# Patient Record
Sex: Female | Born: 1945
Health system: Southern US, Community
[De-identification: ages and names within clinical notes are randomized; demographics above are authoritative.]

## PROBLEM LIST (undated history)

## (undated) DIAGNOSIS — E785 Hyperlipidemia, unspecified: Secondary | ICD-10-CM

## (undated) DIAGNOSIS — F419 Anxiety disorder, unspecified: Secondary | ICD-10-CM

## (undated) DIAGNOSIS — D689 Coagulation defect, unspecified: Secondary | ICD-10-CM

## (undated) DIAGNOSIS — K219 Gastro-esophageal reflux disease without esophagitis: Secondary | ICD-10-CM

## (undated) DIAGNOSIS — I10 Essential (primary) hypertension: Secondary | ICD-10-CM

## (undated) DIAGNOSIS — G4730 Sleep apnea, unspecified: Secondary | ICD-10-CM

## (undated) DIAGNOSIS — H269 Unspecified cataract: Secondary | ICD-10-CM

## (undated) HISTORY — PX: EYE SURGERY: SHX253

## (undated) HISTORY — DX: Anxiety disorder, unspecified: F41.9

## (undated) HISTORY — DX: Essential (primary) hypertension: I10

## (undated) HISTORY — DX: Gastro-esophageal reflux disease without esophagitis: K21.9

## (undated) HISTORY — DX: Coagulation defect, unspecified: D68.9

## (undated) HISTORY — PX: TOE SURGERY: SHX1073

## (undated) HISTORY — DX: Hyperlipidemia, unspecified: E78.5

## (undated) HISTORY — PX: UPPER GASTROINTESTINAL ENDOSCOPY: SHX188

## (undated) HISTORY — DX: Unspecified cataract: H26.9

## (undated) HISTORY — DX: Sleep apnea, unspecified: G47.30

## (undated) HISTORY — PX: REDUCTION MAMMAPLASTY: SUR839

---

## 1966-06-16 HISTORY — PX: APPENDECTOMY: SHX54

## 1966-06-16 HISTORY — PX: TUBAL LIGATION: SHX77

## 1966-06-16 HISTORY — PX: CHOLECYSTECTOMY: SHX55

## 1999-08-19 ENCOUNTER — Other Ambulatory Visit: Admission: RE | Admit: 1999-08-19 | Discharge: 1999-08-19 | Payer: Self-pay | Admitting: Obstetrics and Gynecology

## 1999-09-09 ENCOUNTER — Encounter: Payer: Self-pay | Admitting: Obstetrics and Gynecology

## 1999-09-09 ENCOUNTER — Encounter: Admission: RE | Admit: 1999-09-09 | Discharge: 1999-09-09 | Payer: Self-pay | Admitting: Obstetrics and Gynecology

## 2000-10-16 ENCOUNTER — Encounter: Admission: RE | Admit: 2000-10-16 | Discharge: 2000-10-16 | Payer: Self-pay | Admitting: Obstetrics and Gynecology

## 2000-10-16 ENCOUNTER — Encounter: Payer: Self-pay | Admitting: Obstetrics and Gynecology

## 2000-10-16 ENCOUNTER — Other Ambulatory Visit: Admission: RE | Admit: 2000-10-16 | Discharge: 2000-10-16 | Payer: Self-pay | Admitting: Obstetrics and Gynecology

## 2001-05-24 ENCOUNTER — Encounter: Admission: RE | Admit: 2001-05-24 | Discharge: 2001-05-24 | Payer: Self-pay | Admitting: *Deleted

## 2001-05-24 ENCOUNTER — Encounter: Payer: Self-pay | Admitting: *Deleted

## 2001-11-10 ENCOUNTER — Other Ambulatory Visit: Admission: RE | Admit: 2001-11-10 | Discharge: 2001-11-10 | Payer: Self-pay | Admitting: Obstetrics and Gynecology

## 2001-12-22 ENCOUNTER — Encounter: Admission: RE | Admit: 2001-12-22 | Discharge: 2001-12-22 | Payer: Self-pay | Admitting: Obstetrics and Gynecology

## 2001-12-22 ENCOUNTER — Encounter: Payer: Self-pay | Admitting: Obstetrics and Gynecology

## 2002-06-16 HISTORY — PX: COLONOSCOPY: SHX174

## 2002-07-20 ENCOUNTER — Ambulatory Visit (HOSPITAL_COMMUNITY): Admission: RE | Admit: 2002-07-20 | Discharge: 2002-07-20 | Payer: Self-pay | Admitting: *Deleted

## 2002-07-20 ENCOUNTER — Encounter (INDEPENDENT_AMBULATORY_CARE_PROVIDER_SITE_OTHER): Payer: Self-pay | Admitting: Specialist

## 2002-08-10 ENCOUNTER — Ambulatory Visit (HOSPITAL_BASED_OUTPATIENT_CLINIC_OR_DEPARTMENT_OTHER): Admission: RE | Admit: 2002-08-10 | Discharge: 2002-08-10 | Payer: Self-pay | Admitting: Family Medicine

## 2003-06-27 ENCOUNTER — Encounter: Admission: RE | Admit: 2003-06-27 | Discharge: 2003-06-27 | Payer: Self-pay | Admitting: Obstetrics and Gynecology

## 2003-06-27 ENCOUNTER — Other Ambulatory Visit: Admission: RE | Admit: 2003-06-27 | Discharge: 2003-06-27 | Payer: Self-pay | Admitting: Obstetrics and Gynecology

## 2003-10-26 ENCOUNTER — Ambulatory Visit (HOSPITAL_COMMUNITY): Admission: RE | Admit: 2003-10-26 | Discharge: 2003-10-27 | Payer: Self-pay | Admitting: Plastic Surgery

## 2003-10-26 ENCOUNTER — Encounter (INDEPENDENT_AMBULATORY_CARE_PROVIDER_SITE_OTHER): Payer: Self-pay | Admitting: *Deleted

## 2004-07-26 ENCOUNTER — Other Ambulatory Visit: Admission: RE | Admit: 2004-07-26 | Discharge: 2004-07-26 | Payer: Self-pay | Admitting: Obstetrics and Gynecology

## 2004-07-30 ENCOUNTER — Encounter: Admission: RE | Admit: 2004-07-30 | Discharge: 2004-07-30 | Payer: Self-pay | Admitting: Obstetrics and Gynecology

## 2004-08-06 ENCOUNTER — Ambulatory Visit: Payer: Self-pay

## 2005-08-13 ENCOUNTER — Ambulatory Visit (HOSPITAL_COMMUNITY): Admission: RE | Admit: 2005-08-13 | Discharge: 2005-08-13 | Payer: Self-pay | Admitting: *Deleted

## 2005-10-14 ENCOUNTER — Encounter: Admission: RE | Admit: 2005-10-14 | Discharge: 2005-10-14 | Payer: Self-pay | Admitting: Obstetrics and Gynecology

## 2005-10-14 ENCOUNTER — Other Ambulatory Visit: Admission: RE | Admit: 2005-10-14 | Discharge: 2005-10-14 | Payer: Self-pay | Admitting: Obstetrics & Gynecology

## 2008-08-02 ENCOUNTER — Ambulatory Visit: Payer: Self-pay

## 2009-02-13 ENCOUNTER — Encounter: Admission: RE | Admit: 2009-02-13 | Discharge: 2009-02-13 | Payer: Self-pay | Admitting: Internal Medicine

## 2010-11-01 NOTE — Op Note (Signed)
NAMEJONIYAH, Alicia Parker               ACCOUNT NO.:  [id number]   MEDICAL RECORD NO.:  [id number]          PATIENT TYPE:  AMB   LOCATION:  ENDO                         FACILITY:  [facility]   PHYSICIAN:  Georgiana Spinner, M.D.    DATE OF BIRTH:  [birth year]   DATE OF PROCEDURE:  08/13/2005  DATE OF DISCHARGE:                                 OPERATIVE REPORT   PROCEDURE:  Colonoscopy.   ENDOSCOPIST:  Georgiana Spinner, M.D.   INDICATIONS:  Colon polyps.   ANESTHESIA:  None further given.   PROCEDURE:  With the patient mildly sedated in the left lateral decubitus  position, the Olympus videoscopic colonoscope was inserted into the rectum  and passed under direct vision to the cecum, identified by ileocecal valve  and crow's foot of the cecum, both of which were photographed; however,  because of technical difficulties, the photographs were not obtained.  From  this point, the colonoscope was slowly withdrawn, taking circumferential  views of the colonic mucosa, stopping in the rectum, which appeared normal  on direct, and retroflexed view showed hemorrhoids.  The endoscope was  straightened and withdrawn.  The patient's vital signs and pulse oximeter  remained stable.  The patient tolerated the procedure well without apparent  complications.   FINDINGS:  Internal hemorrhoids, otherwise an unremarkable colonoscopic  examination to cecum.   PLAN:  Repeat examination in 5 years           ______________________________  Georgiana Spinner, M.D.     GMO/MEDQ  D:  08/13/2005  T:  08/14/2005  Job:  [id number]

## 2010-11-01 NOTE — Op Note (Signed)
NAMELATRAVIA, Alicia Parker                         ACCOUNT NO.:  [id number]   MEDICAL RECORD NO.:  [id number]                   PATIENT TYPE:  OIB   LOCATION:  5715                                 FACILITY:  [facility]   PHYSICIAN:  Brantley Persons, M.D.             DATE OF BIRTH:  07-15-1945   DATE OF PROCEDURE:  10/26/2003  DATE OF DISCHARGE:                                 OPERATIVE REPORT   PREOPERATIVE DIAGNOSIS:  Bilateral macromastia.   POSTOPERATIVE DIAGNOSIS:  Bilateral macromastia.   PROCEDURE:  Bilateral reduction mammoplasties.   SURGEON:  Brantley Persons, M.D.   ASSISTANT:  Raynald Kemp, M.D.   ANESTHESIA:  General endotracheal.   ANESTHESIOLOGIST:  Zenon Mayo, M.D.   ESTIMATED BLOOD LOSS:  Was 200 mL.   FLUIDS REPLACED:  Was 2000 mL of crystalloid.   URINE OUTPUT:  Was 650 mL.   COMPLICATIONS:  None.   INDICATIONS FOR PROCEDURE:  The patient is a 65 year old African-American  female who has bilateral macromastia that is clinically symptomatic.  She  presents to undergo bilateral reduction mammoplasty surgery.   JUSTIFICATION FOR OVERNIGHT STAY:  Progressive pain control along with  ambulation and monitoring of the nipple and breast flaps, and a history of  sleep apnea.   DESCRIPTION OF PROCEDURE:  The patient was marked in the preoperative  holding area in the pattern of Wise for the future bilateral reduction  mammoplasties.  She was then taken back to the operating room and placed on  the table in the supine position.  After adequate general endotracheal  anesthesia was obtained, the patient's chest was prepped with Betadine and  alcohol and draped in a sterile fashion.  First the right breast reduction was performed, as this was the slightly  smaller breast of the two.  The nipple was marked with the 45 mm nipple  marker.  This was then incised and the skin around the nipple de-  epithelialized down to the inframammary crease in the inferior  pedicle  pattern.  Next, the medial and superior lateral skin flaps elevated off of  the pedicle down to the chest wall.  The excess fat and glandular tissue  were then removed from the inferior pedicle.  The nipple was examined and  found to be pink and viable.  The wound was irrigated with saline  irrigation.  Meticulous hemostasis was obtained with the Bovie  electrocautery.  The inferior pedicle tissue was then internally shaped with  #3-0 Prolene sutures, to make it a perkier breast.  A #10 JP flap was fully-  fluted drain was then placed into the wound.  The skin flaps were brought  together at the inverted-T junction with a #2-0 Prolene suture.  The rest of  the skin flaps were then stapled for temporary closure.  In evaluating the  breast, it appeared to be a nice size and shape for the patient.  Attention was then  turned to the left breast.  The nipple was marked with  the 45 mm nipple marker.  This was then incised and the skin de-  epithelialized down to the inframammary crease and an inferior pedicle  pattern.  Next, the medial common superior and lateral skin flaps were  elevated off of the chest wall.  The excess fat and glandular tissue were  removed from the inferior pedicle.  The wound was then irrigated with saline  irrigation.  Meticulous hemostasis was obtained with the Bovie  electrocautery.  The breast inferior pedicle was then internally shaped  using #3-0 Prolene sutures.  A #10 JP flat fully-fluted drain was then  placed into the wound and brought out through the future incision site.  The  skin flaps were brought together at the inverted-T junction with a #2-0  Prolene suture.  The rest of the skin incisions were then stapled for  temporary closure.  The breasts were then compared and found to have good  shape and symmetry.  All of the staples were then removed, and the incisions  were closed using a #3-0 Monocryl suture in the dermal layer, followed by a  #4-0  Monocryl running intercuticular stitch on the skin.  The patient was  then placed in the upright position, to mark the future nipple location for  both breasts.  The 42 mm nipple marker was used for this.  With the patient  in the upright position, it did appear as if her breasts were fairly  symmetric.  After the location of the proposed new nipple/areolar complexes  were marked, the patient was then placed back into the supine and recumbent  position.  First, the future nipple/areolar complex was incised on the right breast  mound.  The tissue was excised full thickness through the skin into the  subcutaneous tissues.  The nipple/areolar complex was then located on the  inferior pedicle, and brought out into the aperture and sewn in place using  the #4-0 Monocryl in the dermal layer, followed by a #5-0 Monocryl running  intercuticular stitch on the skin.  In a likewise fashion, the future  nipple/areolar complex was incised on the left breast mound.  This tissue  was excised full thickness.  The nipple/areolar complex on the left breast  inferior pedicle was then also examined, and then brought out through this  new nipple/areolar site and sewn in place using #4-0 Monocryl in the dermal  layer, followed by a #5-0 Monocryl on the intercuticular stitch on the skin.  The JP drain was sewn in place using #3-0 nylon suture.  Benzoin and Steri-  Strips were placed on the incisions, and the nipples additionally had  Bacitracin ointment and Adaptic.  Then 4 x 4s were placed over the breast  incisions and the JP drain.  She was then also placed into a postoperative  light support bra.  There were no complications.  The patient tolerated the procedure well.  The final needle and sponge  counts were reported to be correct at the end of the case.  She was then  awakened from the anesthesia, and taken to the recovery room in stable  condition.  DISPOSITION:  The patient will remain overnight in the  hospital, as a 23-  hour observation stay patient.  She will do this to have monitoring of her  nipples and breast flaps, as well as good pain control.  The plans are to  discharge the patient in the morning.  Brantley Persons, M.D.    MC/MEDQ  D:  10/26/2003  T:  10/28/2003  Job:  [id number]   cc:   Raynald Kemp, M.D.  [address]  [place]  [place] [address]  Fax: [phone]

## 2010-11-01 NOTE — Op Note (Signed)
   Alicia Parker, Alicia Parker                         ACCOUNT NO.:  [id number]   MEDICAL RECORD NO.:  [id number]                   PATIENT TYPE:  AMB   LOCATION:  ENDO                                 FACILITY:  [facility]   PHYSICIAN:  Georgiana Spinner, M.D.                 DATE OF BIRTH:  11/08/1945   DATE OF PROCEDURE:  DATE OF DISCHARGE:                                 OPERATIVE REPORT   PROCEDURE:  Upper endoscopy.   ANESTHESIA:  Demerol 50, Versed 4 mg.   INDICATIONS FOR PROCEDURE:  Reflux and abdominal pain.   DESCRIPTION OF PROCEDURE:  With the patient mildly sedated in the left  lateral decubitus position, the Olympus videoscopic endoscope was inserted  in the mouth and passed under direct vision through the esophagus which  appeared normal into the stomach. The fundus and body appeared normal. The  antrum appeared mildly edematous in the prepyloric area. Photographs and  biopsies taken. We entered into the duodenal bulb and it was quite  erythematous consistent with a duodenitis. There may have been some erosions  in this area as well. These were photographed. The second portion of the  duodenum appeared normal. From this point, the endoscope was slowly  withdrawn taking circumferential views of the duodenal mucosa until the  endoscope was then pulled back into the stomach, placed in retroflexion to  view the stomach from below. The endoscope was then straightened and  withdrawn taking circumferential views of the remaining gastric and  esophageal mucosa. The patient's vital signs and pulse oximeter remained  stable. The patient tolerated the procedure well without apparent  complications.   FINDINGS:  Mild edema of antrum biopsied and duodenitis with erosions. Await  biopsy report. If the patient is H. pylori positive will treat appropriately  but must rule out NSAID induced duodenitis. Will discuss this further with  the patient. Probably start patient on PPI therapy and have the  patient  follow-up with me as an outpatient. Proceed to colonoscopy.                                                 Georgiana Spinner, M.D.    GMO/MEDQ  D:  07/20/2002  T:  07/20/2002  Job:  [id number]   cc:   Otho Darner, M.D.  [address]  [place]  [place] [address]  Fax: [phone]

## 2010-11-01 NOTE — Op Note (Signed)
   NAMEANGELAMARIE, Alicia Parker                         ACCOUNT NO.:  [id number]   MEDICAL RECORD NO.:  [id number]                   PATIENT TYPE:  AMB   LOCATION:  ENDO                                 FACILITY:  [facility]   PHYSICIAN:  Georgiana Spinner, M.D.                 DATE OF BIRTH:  06-18-1945   DATE OF PROCEDURE:  07/20/2002  DATE OF DISCHARGE:                                 OPERATIVE REPORT   PROCEDURE:  Colonoscopy.   INDICATIONS:  Colon polyps, colon cancer screening.   ANESTHESIA:  Demerol 25 mg, Versed 2 mg.   DESCRIPTION OF PROCEDURE:  With the patient mildly sedated in the left  lateral decubitus position, the Olympus videoscopic colonoscope was inserted  in the rectum and passed under direct vision to the cecum, identified by the  crow's foot of the cecum and the ileocecal valve, both of which were  photographed.  From this point the colonoscope was slowly withdrawn, taking  circumferential views of the entire colonic mucosa, stopping at 40 cm from  the anal verge, at which point a small polyp was seen, photographed, and  removed using snare cautery technique, setting of 20-20 blended current.  The endoscope was then withdrawn all the way to the rectum, which appeared  normal on direct and retroflexed view.  The endoscope was straightened and  withdrawn.  The patient's vital signs and pulse oximetry remained stable.  The patient tolerated the procedure well without apparent complications.   FINDINGS:  Polyp at 40 cm from the anal verge.   PLAN:  Await biopsy report.  The patient will call me for results and follow  up with me as an outpatient.                                               Georgiana Spinner, M.D.    GMO/MEDQ  D:  07/20/2002  T:  07/20/2002  Job:  [id number]   cc:   Otho Darner, M.D.  [address]  [place]  [place] [address]  Fax: [phone]

## 2010-11-01 NOTE — Op Note (Signed)
   Alicia Parker, Alicia Parker                         ACCOUNT NO.:  [id number]   MEDICAL RECORD NO.:  [id number]                   PATIENT TYPE:  AMB   LOCATION:  ENDO                                 FACILITY:  [facility]   PHYSICIAN:  Georgiana Spinner, M.D.                 DATE OF BIRTH:  11/12/1945   DATE OF PROCEDURE:  07/20/2002  DATE OF DISCHARGE:                                 OPERATIVE REPORT   ADDENDUM:  The patient actually did desaturate some transiently with a lot  of loud snoring and appeared to have some degree of sleep apnea.  Discussed  this with her husband after the procedure, and he states that she has been  doing that.  He is not sure that she has had any evaluation of that problem,  but I will give a referral to Soyla Murphy. Renne Crigler, M.D., who does sleep studies  in my practice for further evaluation of that problem only, and further  follow-up will be determined by Dr. Renne Crigler and by Otho Darner, M.D.  Attach this to the colonoscopy note.                                               Georgiana Spinner, M.D.    GMO/MEDQ  D:  07/20/2002  T:  07/20/2002  Job:  [id number]   cc:   Otho Darner, M.D.  [address]  [place]  [place] [address]  Fax: [phone]   Soyla Murphy. Renne Crigler, M.D.  [address]  [place]  [place] [address]  Fax: [phone]

## 2010-11-01 NOTE — Op Note (Signed)
Alicia Parker, Alicia Parker               ACCOUNT NO.:  [id number]   MEDICAL RECORD NO.:  [id number]          PATIENT TYPE:  AMB   LOCATION:  ENDO                         FACILITY:  [facility]   PHYSICIAN:  Georgiana Spinner, M.D.    DATE OF BIRTH:  September 17, 1945   DATE OF PROCEDURE:  08/13/2005  DATE OF DISCHARGE:                                 OPERATIVE REPORT   PROCEDURE:  Upper endoscopy.   INDICATIONS:  Gastroesophageal reflux disease.   ANESTHESIA:  Demerol 50, Versed 5 mg.   PROCEDURE:  With the patient mildly sedated in the left lateral decubitus  position,  the Olympus videoscopic endoscope was inserted in the mouth and  passed under direct vision through the esophagus which appeared normal.  There was no evidence of Barrett's esophagus. We entered into the stomach.  The fundus, body, antrum, duodenal bulb, second portion of duodenum were  visualized. From this point, the endoscope was slowly withdrawn taking  circumferential views of the duodenal mucosa until the endoscope had been  pulled back into the stomach placed in retroflexion to view the stomach from  below. The endoscope was straightened and withdrawn taking circumferential  views of the remaining gastric and esophageal mucosa. The patient's vital  signs and pulse oximeter remained stable. The patient tolerated the  procedure well without apparent complications.   FINDINGS:  Unremarkable examination.   PLAN:  Proceed to colonoscopy.           ______________________________  Georgiana Spinner, M.D.     GMO/MEDQ  D:  08/13/2005  T:  08/14/2005  Job:  [id number]

## 2011-09-04 ENCOUNTER — Ambulatory Visit (INDEPENDENT_AMBULATORY_CARE_PROVIDER_SITE_OTHER): Payer: BC Managed Care – PPO | Admitting: Internal Medicine

## 2011-09-04 VITALS — BP 119/73 | HR 61 | Temp 98.3°F | Resp 16 | Ht 60.5 in | Wt 168.6 lb

## 2011-09-04 DIAGNOSIS — R42 Dizziness and giddiness: Secondary | ICD-10-CM

## 2011-09-04 DIAGNOSIS — H8110 Benign paroxysmal vertigo, unspecified ear: Secondary | ICD-10-CM

## 2011-09-04 DIAGNOSIS — H8109 Meniere's disease, unspecified ear: Secondary | ICD-10-CM

## 2011-09-04 LAB — POCT CBC
Granulocyte percent: 53.3 %G (ref 37–80)
Hemoglobin: 12.5 g/dL (ref 12.2–16.2)
MCH, POC: 27.5 pg (ref 27–31.2)
MCHC: 31.9 g/dL (ref 31.8–35.4)
MCV: 86.3 fL (ref 80–97)
POC Granulocyte: 3.1 (ref 2–6.9)
POC MID %: 6.6 %M (ref 0–12)
RDW, POC: 14.7 %

## 2011-09-04 NOTE — Progress Notes (Signed)
  Subjective:    Patient ID: Alicia Parker, female    DOB: Oct 20, 1945, 66 y.o.   MRN: [id number]  HPI Had on 5 min spell of vertigo while walking yesterday. No HA, nause, speech change, or focal NMS loss. Full recovery and normal today. Chart reviewed an d had normal cpe and tests jan. 2012, on no meds    Review of Systems See normal ROS 2012 and unchanged    Objective:   Physical Exam  Constitutional: She is oriented to person, place, and time. She appears well-nourished. No distress.  HENT:  Right Ear: Hearing, tympanic membrane and external ear normal.  Left Ear: Hearing, tympanic membrane and external ear normal.  Nose: Nose normal.  Mouth/Throat: Oropharynx is clear and moist.  Eyes: EOM are normal. Pupils are equal, round, and reactive to light.  Neck: Normal range of motion. Neck supple.  Cardiovascular: Normal rate, regular rhythm and normal heart sounds.   Pulmonary/Chest: Effort normal and breath sounds normal.  Abdominal: Soft. Bowel sounds are normal.  Musculoskeletal: Normal range of motion.  Neurological: She is alert and oriented to person, place, and time. She has normal strength and normal reflexes. No cranial nerve deficit or sensory deficit. She displays a negative Romberg sign. She displays no Babinski's sign on the right side. She displays no Babinski's sign on the left side.       Good balance No drift  Skin: Skin is warm and dry.  Psychiatric: She has a normal mood and affect. Her speech is normal and behavior is normal. Judgment and thought content normal.     Results for orders placed in visit on 09/04/11  POCT CBC      Component Value Range   WBC 5.9  4.6 - 10.2 (K/uL)   Lymph, poc 2.4  0.6 - 3.4    POC LYMPH PERCENT 40.1  10 - 50 (%L)   MID (cbc) 0.4  0 - 0.9    POC MID % 6.6  0 - 12 (%M)   POC Granulocyte 3.1  2 - 6.9    Granulocyte percent 53.3  37 - 80 (%G)   RBC 4.54  4.04 - 5.48 (M/uL)   Hemoglobin 12.5  12.2 - 16.2 (g/dL)   HCT, POC  46.9  62.9 - 47.9 (%)   MCV 86.3  80 - 97 (fL)   MCH, POC 27.5  27 - 31.2 (pg)   MCHC 31.9  31.8 - 35.4 (g/dL)   RDW, POC 52.8     Platelet Count, POC 286  142 - 424 (K/uL)   MPV 7.4  0 - 99.8 (fL)    Normal  CBC    Assessment & Plan:  BPV  Semont maneuver given Meclizine prn Refer to ENT if worsens

## 2011-09-04 NOTE — Patient Instructions (Signed)

## 2011-10-08 ENCOUNTER — Other Ambulatory Visit: Payer: Self-pay | Admitting: Internal Medicine

## 2011-10-08 ENCOUNTER — Other Ambulatory Visit: Payer: Self-pay | Admitting: Oncology

## 2011-10-08 DIAGNOSIS — Z1231 Encounter for screening mammogram for malignant neoplasm of breast: Secondary | ICD-10-CM

## 2011-10-15 ENCOUNTER — Ambulatory Visit (INDEPENDENT_AMBULATORY_CARE_PROVIDER_SITE_OTHER): Payer: BC Managed Care – PPO | Admitting: Family Medicine

## 2011-10-15 ENCOUNTER — Encounter: Payer: Self-pay | Admitting: Family Medicine

## 2011-10-15 ENCOUNTER — Ambulatory Visit
Admission: RE | Admit: 2011-10-15 | Discharge: 2011-10-15 | Disposition: A | Discharge status: Home / Self Care | Payer: BC Managed Care – PPO | Source: Ambulatory Visit | Attending: Internal Medicine | Admitting: Internal Medicine

## 2011-10-15 ENCOUNTER — Other Ambulatory Visit: Payer: Self-pay | Admitting: Family Medicine

## 2011-10-15 VITALS — BP 135/78 | HR 69 | Temp 98.0°F | Resp 16 | Ht 60.0 in | Wt 168.8 lb

## 2011-10-15 DIAGNOSIS — E669 Obesity, unspecified: Secondary | ICD-10-CM

## 2011-10-15 DIAGNOSIS — M47816 Spondylosis without myelopathy or radiculopathy, lumbar region: Secondary | ICD-10-CM

## 2011-10-15 DIAGNOSIS — IMO0002 Reserved for concepts with insufficient information to code with codable children: Secondary | ICD-10-CM

## 2011-10-15 DIAGNOSIS — Z01419 Encounter for gynecological examination (general) (routine) without abnormal findings: Secondary | ICD-10-CM

## 2011-10-15 DIAGNOSIS — E782 Mixed hyperlipidemia: Secondary | ICD-10-CM

## 2011-10-15 DIAGNOSIS — E7800 Pure hypercholesterolemia, unspecified: Secondary | ICD-10-CM

## 2011-10-15 DIAGNOSIS — Z1231 Encounter for screening mammogram for malignant neoplasm of breast: Secondary | ICD-10-CM

## 2011-10-15 DIAGNOSIS — Z0000 Encounter for general adult medical examination without abnormal findings: Secondary | ICD-10-CM

## 2011-10-15 LAB — BASIC METABOLIC PANEL
Calcium: 9.2 mg/dL (ref 8.4–10.5)
Chloride: 104 mEq/L (ref 96–112)
Creat: 0.7 mg/dL (ref 0.50–1.10)
Glucose, Bld: 87 mg/dL (ref 70–99)

## 2011-10-15 LAB — POCT URINALYSIS DIPSTICK
Bilirubin, UA: NEGATIVE
Nitrite, UA: NEGATIVE
Protein, UA: NEGATIVE

## 2011-10-15 LAB — LIPID PANEL
Cholesterol: 184 mg/dL (ref 0–200)
HDL: 59 mg/dL (ref >39)
LDL Cholesterol: 114 mg/dL — ABNORMAL HIGH (ref 0–99)
Total CHOL/HDL Ratio: 3.1 Ratio
Triglycerides: 57 mg/dL (ref <150)
VLDL: 11 mg/dL (ref 0–40)

## 2011-10-15 NOTE — Patient Instructions (Signed)
HPV Test The HPV (Human papillomavirus) test isused to screen for high-risk types with HPV infection. HPV is a group of about 100 related viruses, of which 40 types are genital viruses. Most HPV viruses cause infections that usually resolve without treatment within 2 years. Some HPV infections can cause skin and genital warts (condylomata). HPV types 16, 18, 31 and 45 are considered high-risk types of HPV. High-risk types of HPV do not usually cause visible warts, but if untreated, may lead to cancers of the outlet of the womb (cervix) or anus. An HPV test identifies the DNA (genetic) strands of the HPV infection. Because the test identifies the DNA strands, the test is also referred to as the HPV DNA test. Although HPV is found in both males and females, the HPV test is only used to screen for cervical cancer in females. This test is recommended for females:  With an abnormal Pap test.   After treatment of an abnormal Pap test.   Aged 65 and older.   After treatment of a high-risk HPV infection.  The HPV test may be done at the same time as a Pap test in females over the age of 63. Both the HPV and Pap test require a sample of cells from the cervix. PREPARATION FOR TEST  You may be asked to avoid douching, tampons or birth canal (vaginal) medicines for 48 hours before the HPV test. You will be asked to urinate before the test. For the HPV test, you will need to lie on an exam table with your feet in stirrups. A spatula will be inserted into the vagina. The spatula will be used to swab the cervix for a cell and mucus sample. The sample will be further evaluated in a lab under a microscope. NORMAL FINDINGS  Normal: High-risk HPV is not found.  Ranges for normal findings may vary among different laboratories and hospitals. You should always check with your doctor after having lab work or other tests done to discuss the meaning of your test results and whether your values are considered within normal  limits. MEANING OF TEST An abnormal HPV test means that high-risk HPV is found. Your caregiver may recommend further testing. Your caregiver will go over the test results with you and discuss the importance and meaning of your results, as well as treatment options and the need for additional tests, if necessary. OBTAINING THE RESULTS  It is your responsibility to obtain your test results. Ask the lab or department performing the test when and how you will get your results. Document Released: 06/27/2004 Document Revised: 05/22/2011 Document Reviewed: 03/12/2005 [facility] Patient Information 2012 [place], [place].     Keeping You Healthy  Get These Tests  Blood Pressure- Have your blood pressure checked by your healthcare provider at least once a year.  Normal blood pressure is 120/80.  Weight- Have your body mass index (BMI) calculated to screen for obesity.  BMI is a measure of body fat based on height and weight.  You can calculate your own BMI at https://www.west-esparza.com/  Cholesterol- Have your cholesterol checked every year.  Diabetes- Have your blood sugar checked every year if you have high blood pressure, high cholesterol, a family history of diabetes or if you are overweight.  Pap Smear- Have a pap smear every 1 to 3 years if you have been sexually active.  If you are older than 65 and recent pap smears have been normal you may not need additional pap smears.  In addition, if  you have had a hysterectomy  For benign disease additional pap smears are not necessary.  Mammogram-Yearly mammograms are essential for early detection of breast cancer  Screening for Colon Cancer- Colonoscopy starting at age 31. Screening may begin sooner depending on your family history and other health conditions.  Follow up colonoscopy as directed by your Gastroenterologist.  Screening for Osteoporosis- Screening begins at age 20 with bone density scanning, sooner if you are at higher risk for developing  Osteoporosis.  Get these medicines  Calcium with Vitamin D- Your body requires 1200-1500 mg of Calcium a day and [phone] IU of Vitamin D a day.  You can only absorb 500 mg of Calcium at a time therefore Calcium must be taken in 2 or 3 separate doses throughout the day.  Hormones- Hormone therapy has been associated with increased risk for certain cancers and heart disease.  Talk to your healthcare provider about if you need relief from menopausal symptoms.  Aspirin- Ask your healthcare provider about taking Aspirin to prevent Heart Disease and Stroke.  Get these Immuniztions  Flu shot- Every fall  Pneumonia shot- Once after the age of 71; if you are younger ask your healthcare provider if you need a pneumonia shot.  Tetanus- Every ten years.  Zostavax- Once after the age of 11 to prevent shingles.  Take these steps  Don't smoke- Your healthcare provider can help you quit. For tips on how to quit, ask your healthcare provider or go to www.smokefree.gov or call 1-800 QUIT-NOW.  Be physically active- Exercise 5 days a week for a minimum of 30 minutes.  If you are not already physically active, start slow and gradually work up to 30 minutes of moderate physical activity.  Try walking, dancing, bike riding, swimming, etc.  Eat a healthy diet- Eat a variety of healthy foods such as fruits, vegetables, whole grains, low fat milk, low fat cheeses, yogurt, lean meats, chicken, fish, eggs, dried beans, tofu, etc.  For more information go to www.thenutritionsource.org  Dental visit- Brush and floss teeth twice daily; visit your dentist twice a year.  Eye exam- Visit your Optometrist or Ophthalmologist yearly.  Drink alcohol in moderation- Limit alcohol intake to one drink or less a day.  Never drink and drive.  Depression- Your emotional health is as important as your physical health.  If you're feeling down or losing interest in things you normally enjoy, please talk to your healthcare  provider.  Seat Belts- can save your life; always wear one  Smoke/Carbon Monoxide detectors- These detectors need to be installed on the appropriate level of your home.  Replace batteries at least once a year.  Violence- If anyone is threatening or hurting you, please tell your healthcare provider.  Living Will/ Health care power of attorney- Discuss with your healthcare provider and family.

## 2011-10-18 ENCOUNTER — Encounter: Payer: Self-pay | Admitting: *Deleted

## 2011-10-18 ENCOUNTER — Encounter: Payer: Self-pay | Admitting: Family Medicine

## 2011-10-18 DIAGNOSIS — Z8719 Personal history of other diseases of the digestive system: Secondary | ICD-10-CM | POA: Insufficient documentation

## 2011-10-18 DIAGNOSIS — E7800 Pure hypercholesterolemia, unspecified: Secondary | ICD-10-CM | POA: Insufficient documentation

## 2011-10-18 DIAGNOSIS — D049 Carcinoma in situ of skin, unspecified: Secondary | ICD-10-CM | POA: Insufficient documentation

## 2011-10-18 DIAGNOSIS — Z86010 Personal history of colonic polyps: Secondary | ICD-10-CM | POA: Insufficient documentation

## 2011-10-18 DIAGNOSIS — IMO0002 Reserved for concepts with insufficient information to code with codable children: Secondary | ICD-10-CM | POA: Insufficient documentation

## 2011-10-18 DIAGNOSIS — E669 Obesity, unspecified: Secondary | ICD-10-CM | POA: Insufficient documentation

## 2011-10-18 DIAGNOSIS — M47816 Spondylosis without myelopathy or radiculopathy, lumbar region: Secondary | ICD-10-CM | POA: Insufficient documentation

## 2011-10-18 NOTE — Progress Notes (Signed)
Quick Note:  Please call pt and advise that the following labs are abnormal...  Your LDL ("bad") cholesterol is a little above normal; your can improve this by eating healthier- no fried foods, less high fat foods, less processed foods, and more fruits and vegetables (eat the rainbow!) Eat less red meat and more chicken or pork and fish at least once a week. Eat low- fat dairy products .Regular exercise is important also.  All other labs are normal.   Copy to pt. ______

## 2011-10-18 NOTE — Progress Notes (Signed)
Subjective:    Patient ID: Alicia Parker, female    DOB: 1945/09/01, 66 y.o.   MRN: [id number]  HPI   This 66 y.o. AA female is here for CPE/PAP. PAP: 06/27/10- Negative/ +HPV. She has a history of  Sleep Apnea- sleep study done in 07/2002 (not using CPAP).  She is married x 45 years with 3 adult children and 8 grandchildren. She quit smoking 10/15/11. She drinks  beer on occasion but does not exercise. She works at [employer] Store.   Mammogram: 2011 (normal).  Colonoscopy: >5 years ago (normal).  Eye exam: 02/2011      Review of Systems  Constitutional: Negative.   HENT: Negative.   Eyes: Negative.   Respiratory: Negative for apnea, cough, chest tightness and shortness of breath.   Cardiovascular: Negative for chest pain, palpitations and leg swelling.  Gastrointestinal: Negative.   Genitourinary: Negative for dysuria, urgency, frequency, hematuria, vaginal bleeding, difficulty urinating and pelvic pain.  Musculoskeletal: Positive for back pain, arthralgias and gait problem.  Skin:       Moles and change in hair  Neurological: Negative.   Hematological: Negative.   Psychiatric/Behavioral: Negative.        Objective:   Physical Exam  Nursing note and vitals reviewed. Constitutional: She is oriented to person, place, and time. She appears well-developed and well-nourished. No distress.  HENT:  Head: Normocephalic and atraumatic.  Right Ear: External ear normal.  Left Ear: External ear normal.  Nose: Nose normal.  Mouth/Throat: Oropharynx is clear and moist.  Eyes: Conjunctivae and EOM are normal. Pupils are equal, round, and reactive to light. Right eye exhibits no discharge. Left eye exhibits no discharge.       Muddy sclerae  Neck: Normal range of motion. Neck supple. No JVD present. No thyromegaly present.  Cardiovascular: Normal rate, regular rhythm, normal heart sounds and intact distal pulses.  Exam reveals no gallop and no friction rub.   No murmur  heard. Pulmonary/Chest: Effort normal and breath sounds normal. No respiratory distress. She has no wheezes. She has no rales. Right breast exhibits no inverted nipple, no mass, no nipple discharge, no skin change and no tenderness. Left breast exhibits no inverted nipple, no mass, no nipple discharge, no skin change and no tenderness. Breasts are symmetrical.  Abdominal: Soft. Bowel sounds are normal. She exhibits no distension and no mass. There is no tenderness. There is no guarding.  Genitourinary: Rectum normal, vagina normal and uterus normal. Rectal exam shows no mass, no tenderness and anal tone normal. Guaiac negative stool. There is no rash, tenderness or lesion on the right labia. There is no rash, tenderness or lesion on the left labia. Cervix exhibits no motion tenderness, no discharge and no friability. Right adnexum displays no mass and no tenderness. Left adnexum displays no mass and no tenderness. No erythema or tenderness around the vagina. No vaginal discharge found.  Musculoskeletal: Normal range of motion. She exhibits no edema.       Minimal stiffness in major joints; Lumbar spine tender to palpation  Neurological: She is alert and oriented to person, place, and time. She has normal reflexes. No cranial nerve deficit. She exhibits normal muscle tone. Coordination normal.  Skin: Skin is warm and dry. No erythema.       Multiple nevi: 1-2 mm on trunk  Psychiatric: She has a normal mood and affect. Her behavior is normal. Judgment and thought content normal.    ECG (2010): NSR, normal ECG  Assessment & Plan:   1. Routine general medical examination at a health care facility  Basic metabolic panel, TSH RX: Zostavax vaccine  2. Encounter for cervical Pap smear with pelvic exam  Pap IG (Image Guided) (HPV + on 2012 PAP)  3. Elevated LDL cholesterol level  Lipid panel  4. Lumbar spondylosis   LS spine film 08/2009 showed stable loss of lordosis with spondylosis and disc space  narrowing at L4-L5. Encouraged weight loss

## 2011-10-21 LAB — PAP IG (IMAGE GUIDED)

## 2011-10-29 ENCOUNTER — Encounter: Payer: Self-pay | Admitting: Emergency Medicine

## 2011-10-30 LAB — HUMAN PAPILLOMAVIRUS, HIGH RISK: HPV DNA High Risk: DETECTED — AB

## 2011-10-30 NOTE — Progress Notes (Signed)
Quick Note:  Please notify that PAP is abnormal and will require the following action:  I am awaiting follow-up studies pertaining to previous HPV+ status. Will notify pt of next step in GYN evaluation. ______

## 2011-11-05 NOTE — Progress Notes (Signed)
Addended by: Dow Adolph B on: 11/05/2011 02:27 PM   Modules accepted: Orders

## 2012-08-01 ENCOUNTER — Ambulatory Visit (INDEPENDENT_AMBULATORY_CARE_PROVIDER_SITE_OTHER): Payer: BC Managed Care – PPO | Admitting: Physician Assistant

## 2012-08-01 ENCOUNTER — Encounter: Payer: Self-pay | Admitting: Physician Assistant

## 2012-08-01 ENCOUNTER — Telehealth: Payer: Self-pay | Admitting: Physician Assistant

## 2012-08-01 VITALS — BP 157/81 | HR 63 | Temp 98.0°F | Resp 18 | Ht 60.0 in | Wt 170.0 lb

## 2012-08-01 DIAGNOSIS — H6980 Other specified disorders of Eustachian tube, unspecified ear: Secondary | ICD-10-CM

## 2012-08-01 MED ORDER — BUDESONIDE 32 MCG/ACT NA SUSP: NASAL | Status: DC

## 2012-08-01 MED ORDER — IPRATROPIUM BROMIDE 0.03 % NA SOLN: 2.0000 | Freq: Two times a day (BID) | NASAL | Status: DC

## 2012-08-01 NOTE — Patient Instructions (Addendum)
Begin using the Rhinocort nasal spray - two sprays each nostril twice daily for 1 week, then 1 spray each nostril twice daily. Also begin the Atrovent nasal spray twice daily.  Separate the dosing of these by about 20-30 minutes to get the full effect of both.  Ibuprofen if needed for pain relief.  Please let us know if you are worsening or not improving or if new symptoms develop.  If your ears are not improving in 4 wks, we may want to consider an evaluation with ENT.   Barotitis Media Barotitis media is soreness (inflammation) of the area behind the eardrum (middle ear). This occurs when the auditory tube (Eustachian tube) leading from the back of the throat to the eardrum is blocked. When it is blocked air cannot move in and out of the middle ear to equalize pressure changes. These pressure changes come from changes in altitude when:  Flying.  Driving in the mountains.  Diving. Problems are more likely to occur with pressure changes during times when you are congested as from:  Hay fever.  Upper respiratory infection.  A cold. Damage or hearing loss (barotrauma) caused by this may be permanent. HOME CARE INSTRUCTIONS   Use medicines as recommended by your caregiver. Over the counter medicines will help unblock the canal and can help during times of air travel.  Do not put anything into your ears to clean or unplug them. Eardrops will not be helpful.  Do not swim, dive, or fly until your caregiver says it is all right to do so. If these activities are necessary, chewing gum with frequent swallowing may help. It is also helpful to hold your nose and gently blow to pop your ears for equalizing pressure changes. This forces air into the Eustachian tube.  For little ones with problems, give your baby a bottle of water or juice during periods when pressure changes would be anticipated such as during take offs and landings associated with air travel.  Only take over-the-counter or  prescription medicines for pain, discomfort, or fever as directed by your caregiver.  A decongestant may be helpful in de-congesting the middle ear and make pressure equalization easier. This can be even more effective if the drops (spray) are delivered with the head lying over the edge of a bed with the head tilted toward the ear on the affected side.  If your caregiver has given you a follow-up appointment, it is very important to keep that appointment. Not keeping the appointment could result in a chronic or permanent injury, pain, hearing loss and disability. If there is any problem keeping the appointment, you must call back to this facility for assistance. SEEK IMMEDIATE MEDICAL CARE IF:   You develop a severe headache, dizziness, severe ear pain, or bloody or pus-like drainage from your ears.  An oral temperature above 102 F (38.9 C) develops.  Your problems do not improve or become worse. MAKE SURE YOU:   Understand these instructions.  Will watch your condition.  Will get help right away if you are not doing well or get worse. Document Released: 05/30/2000 Document Revised: 08/25/2011 Document Reviewed: 01/06/2008 [facility] Patient Information 2013 [place], [place].

## 2012-08-01 NOTE — Telephone Encounter (Signed)
Pt called stating that there was only one prescription for her to pick up.  Epic shows that I sent both, but I have resent the Rhinocort

## 2012-08-01 NOTE — Progress Notes (Signed)
Subjective:    Patient ID: Alicia Parker, female    DOB: 01/20/1946, 67 y.o.   MRN: [id number]  HPI   Alicia Parker is a very pleasant 67 yr old female here with concern for illness.  States she has noticed a swollen right submandibular lymph node for the last 3-4 days.  Also has a little cough.  No PND or sore throat.  No nasal symptoms.  No fever or chills.  No GI symptoms.  Has noted some right sided facial pain.  Has probably had some sick contacts at work.  Feels like she has had colds on and off for the last several months.  In addition, she has concern over 2 months of bilateral ear discomfort.  First noted this when flying.  She then developed a cold and then was flying again.  Has had persistent discomfort.  Some pain.  Also fullness.  Ears are popping a lot.  Some muffled hearing.  Sudafed helped the last time she was flying, but she has not been taking it recently.  Has use some ibuprofen for pain relief.      Review of Systems  Constitutional: Negative for fever and chills.  HENT: Positive for hearing loss, ear pain and sinus pressure (right maxillary). Negative for sore throat, rhinorrhea, sneezing, postnasal drip and tinnitus.   Respiratory: Positive for cough. Negative for shortness of breath and wheezing.   Cardiovascular: Negative.   Neurological: Negative.        Objective:   Physical Exam  Vitals reviewed. Constitutional: She is oriented to person, place, and time. She appears well-developed and well-nourished. No distress.  HENT:  Head: Normocephalic and atraumatic.  Right Ear: Tympanic membrane and ear canal normal.  Left Ear: Ear canal normal.  Nose: Right sinus exhibits no maxillary sinus tenderness and no frontal sinus tenderness. Left sinus exhibits no maxillary sinus tenderness and no frontal sinus tenderness.  Mouth/Throat: Uvula is midline, oropharynx is clear and moist and mucous membranes are normal.  Left TM appears abnormal - it is intact and reflective, but  possibly s/p rupture at some point in the past  Eyes: Conjunctivae are normal. No scleral icterus.  Neck: Neck supple.  Cardiovascular: Normal rate, regular rhythm and normal heart sounds.   Pulmonary/Chest: Effort normal and breath sounds normal. She has no wheezes. She has no rales.  Lymphadenopathy:    She has cervical adenopathy.  Neurological: She is alert and oriented to person, place, and time.  Skin: Skin is warm and dry.  Psychiatric: She has a normal mood and affect. Her behavior is normal.      Filed Vitals:   08/01/12 0904  BP: 157/81  Pulse: 63  Temp: 98 F (36.7 C)  Resp: 18       Assessment & Plan:  Eustachian tube dysfunction - Plan: ipratropium (ATROVENT) 0.03 % nasal spray, budesonide (RHINOCORT AQUA) 32 MCG/ACT nasal spray    Alicia Parker is a very pleasant 67 yr old female here with eustachian tube dysfunction.  Will try Rhinocort and Atrovent to relieve symptoms.  Pt is flying at the end of the week and concerned for discomfort.  Encouraged her to use Atrovent before take off to hopefully reduce symptoms.  Discussed with her that if her ear symptoms are not resolving in 4 weeks that we could consider ENT referral.  Pt understands and is in agreement.    Suspect viral etiology of her other symptoms.  If adenopathy persists, we may need to evaluate further.

## 2012-08-04 ENCOUNTER — Telehealth: Payer: Self-pay

## 2012-08-04 MED ORDER — FLUTICASONE PROPIONATE 50 MCG/ACT NA SUSP: NASAL | Status: DC

## 2012-08-04 NOTE — Telephone Encounter (Signed)
Received a prior auth request for pt's rhinocort. Checked w/Elizabeth and she authorized change to flonase NS. Sending in new Rx.

## 2012-09-16 ENCOUNTER — Encounter: Payer: Self-pay | Admitting: Family Medicine

## 2012-09-16 ENCOUNTER — Encounter: Payer: Self-pay | Admitting: Emergency Medicine

## 2012-09-28 ENCOUNTER — Ambulatory Visit (INDEPENDENT_AMBULATORY_CARE_PROVIDER_SITE_OTHER): Payer: BC Managed Care – PPO | Admitting: Certified Nurse Midwife

## 2012-09-28 ENCOUNTER — Encounter: Payer: Self-pay | Admitting: Certified Nurse Midwife

## 2012-09-28 VITALS — BP 122/64 | Ht 60.0 in | Wt 166.0 lb

## 2012-09-28 DIAGNOSIS — N898 Other specified noninflammatory disorders of vagina: Secondary | ICD-10-CM

## 2012-09-28 DIAGNOSIS — D049 Carcinoma in situ of skin, unspecified: Secondary | ICD-10-CM

## 2012-09-28 DIAGNOSIS — Z01419 Encounter for gynecological examination (general) (routine) without abnormal findings: Secondary | ICD-10-CM

## 2012-09-28 DIAGNOSIS — Z0000 Encounter for general adult medical examination without abnormal findings: Secondary | ICD-10-CM

## 2012-09-28 LAB — POCT WET PREP (WET MOUNT)
Clue Cells Wet Prep Whiff POC: NEGATIVE
KOH Wet Prep POC: NEGATIVE

## 2012-09-28 LAB — POCT URINALYSIS DIPSTICK
Glucose, UA: NEGATIVE
Nitrite, UA: NEGATIVE
Protein, UA: NEGATIVE
Urobilinogen, UA: NEGATIVE
pH, UA: 5

## 2012-09-28 NOTE — Progress Notes (Signed)
67 y.o. Married [place] American female   [phone] here for annual exam. Menopausal no HRT. Denies vaginal bleeding or vaginal dryness. History of abnormal pap in 2012 with biopsy and repeat pap 12/13 per patient negative. History of colon polyps due for colonoscopy.  Treated self for ? Yeast and used OTC Monistat and then had Rx Diflucan from PCP no itching now but still has vaginal discharge. Sees PCP yearly and has all labs there with normal results per patient.  Patient's last menstrual period was 06/16/1997.          Sexually active: yes  The current method of family planning is tubal ligation.    Exercising: no  exercise Last mammogram: 5/13 Last pap: 12/13 Last BMD: 2003? Alcohol: 6 a week (beer) Tobacco: 1/2 pack a week Colonoscopy: per record 1998   Health Maintenance  Topic Date Due  . Colonoscopy  03/16/1997  . Zostavax  03/17/2007  . Pneumococcal Polysaccharide Vaccine Age 48 And Over  03/16/2012  . Influenza Vaccine  02/14/2013  . Mammogram  10/14/2013  . Tetanus/tdap  10/27/2019    Family History  Problem Relation Age of Onset  . Hypertension Mother   . Stroke Mother   . Hypertension Father   . Cancer Father 5    Leukemia  . Early death Brother     pneumonia  . Hypertension Maternal Grandmother   . Early death Brother     Patient Active Problem List  Diagnosis  . Elevated LDL cholesterol level  . Lumbar spondylosis  . Obesity (BMI 30-39.9)  . Abnormal finding on Pap smear, HPV DNA positive  . Hx of gastroesophageal reflux (GERD)  . Bowens disease  . History of colon polyps    Past Medical History  Diagnosis Date  . GERD (gastroesophageal reflux disease)     Past Surgical History  Procedure Laterality Date  . Appendectomy  1968  . Cholecystectomy  1968  . Tubal ligation      Allergies: Review of patient's allergies indicates no known allergies.  No current outpatient prescriptions on file.   No current facility-administered medications  for this visit.    ROS: Pertinent items are noted in HPI.  Exam:    BP 122/64  Ht 5' (1.524 m)  Wt 166 lb (75.297 kg)  BMI 32.42 kg/m2  LMP 06/16/1997 Weight change: @WEIGHTCHANGE @ Last 3 height recordings:  Ht Readings from Last 3 Encounters:  09/28/12 5' (1.524 m)  08/01/12 5' (1.524 m)  10/15/11 5' (1.524 m)   General appearance: alert, cooperative and appears stated age Head: Normocephalic, without obvious abnormality, atraumatic Neck: no adenopathy, supple, symmetrical, trachea midline and thyroid not enlarged, symmetric, no tenderness/mass/nodules Lungs: clear to auscultation bilaterally Breasts: normal appearance, no masses or tenderness, No nipple retraction or dimpling Heart: regular rate and rhythm Abdomen: soft, non-tender; bowel sounds normal; no masses,  no organomegaly Extremities: extremities normal, atraumatic, no cyanosis or edema Skin: Skin color, texture, turgor normal. No rashes or lesions Lymph nodes: Cervical, supraclavicular, and axillary nodes normal. no inguinal nodes palpated Neurologic: Alert and oriented X 3, normal strength and tone. Normal symmetric reflexes. Normal coordination and gait   Pelvic: External genitalia:  no lesions and atrophic appearance              Urethra: normal appearing urethra with no masses, tenderness or lesions              Bartholins and Skenes: Bartholin's, Urethra, Skene's normal  Vagina: vaginal discharge - grey no odor, slightly atrophic wet prep taken              Cervix: normal appearance              Pap taken: no        Bimanual Exam:  Uterus:  uterus is normal size, shape, consistency and nontender, anteverted                                      Adnexa:    normal adnexa in size, nontender and no masses                                      Rectovaginal: Confirms                                      Anus:  normal sphincter tone, no lesions  A: Well woman exam Menopausal no HRT History of  Abnormal Pap with biopsy and one follow up pap Colonoscopy due, history of polyps Vaginal Discharge  Wet prep negative Slight Atrophic Vaginitis      P: Reviewed health and wellness pertinent to exam  Mammogram yearly recommended  Pap smear as indicated( per patient in one year) records requested (last pap 05/2012)  Stressed importance of follow up Request referral for colonoscopy Discussed negative wet prep findings, and vagina returning to normal after treatment.  Advise if symptoms reoccur. Discussed Olive Oil use twice weekly for vaginal moisture which helps prevent changes.   return annually or prn      An After Visit Summary was printed and given to the patient.  Reviewed, TL

## 2012-09-28 NOTE — Patient Instructions (Signed)

## 2012-10-07 ENCOUNTER — Telehealth: Payer: Self-pay | Admitting: Certified Nurse Midwife

## 2012-10-07 NOTE — Telephone Encounter (Signed)
Please call patient and determine the issue

## 2012-10-07 NOTE — Telephone Encounter (Signed)
Still having discharge.  Please call her

## 2012-10-07 NOTE — Telephone Encounter (Signed)
Left message at CB# of need to call our office.

## 2012-10-08 ENCOUNTER — Telehealth: Payer: Self-pay | Admitting: *Deleted

## 2012-10-08 NOTE — Telephone Encounter (Signed)
Patient called to state she id feeling better today and will let us know if symptoms of yeast infection start again. sue

## 2012-10-13 NOTE — Telephone Encounter (Signed)
Left message on CB# of need to call our office. sue 

## 2012-10-14 ENCOUNTER — Encounter: Payer: Self-pay | Admitting: Certified Nurse Midwife

## 2012-10-14 ENCOUNTER — Ambulatory Visit (INDEPENDENT_AMBULATORY_CARE_PROVIDER_SITE_OTHER): Payer: BC Managed Care – PPO | Admitting: Certified Nurse Midwife

## 2012-10-14 VITALS — BP 122/62 | HR 60 | Resp 16

## 2012-10-14 DIAGNOSIS — N952 Postmenopausal atrophic vaginitis: Secondary | ICD-10-CM

## 2012-10-14 DIAGNOSIS — B373 Candidiasis of vulva and vagina: Secondary | ICD-10-CM

## 2012-10-14 MED ORDER — TERCONAZOLE 0.4 % VA CREA: 1.0000 | TOPICAL_CREAM | Freq: Every day | VAGINAL | Status: DC

## 2012-10-14 NOTE — Progress Notes (Signed)
67 y.o.MarriedAfrican American female [phone] with a 12 day(s) history of the following:discharge described as yellow and vulvar itching Sexually active: yes Last sexual activity:7days ago. Pt also reports the following associated symptoms: none Patient has not tried over the counter treatment . No new personal products.  Denies vaginal bleeding  O: Healthy female, WD WN Affect: normal, orientation x3    Exam:  AVW:[id number], Urethra, Skene's normal, atrophic                GNF:[id number]: copious and yellow, pH 4.0, wet prep done    Vagina: atrophic                Cx:  normal appearance                Uterus:normal size, anteverted                Adnexa: normal adnexa and no mass, fullness, tenderness  Wet Prep shows:Yeast   Dx: yeast vaginitis        Atrophic vaginitis   [id number] local care discussed. Vaginal antifungal see orders. Discussed vaginal dryness and options for treatment.  Patient desires no estrogen, will use OTC Replens as directed.  Patient will advise if no change.  Rv prn     Reviewed, TL

## 2012-11-02 ENCOUNTER — Telehealth: Payer: Self-pay | Admitting: Certified Nurse Midwife

## 2012-11-02 NOTE — Telephone Encounter (Signed)
Patient calling with yeast infection problem and vaginal discharge . States discharge is no better and is using cream Rx. States was told her hormones are out of wack. Patient says she has to go to work today at 4 pm. Please advise. sue

## 2012-11-02 NOTE — Telephone Encounter (Signed)
Patient is concerned she may have a yeast infection; wanted to discuss possibility and asked to specifically speak with nurse.

## 2012-11-03 NOTE — Telephone Encounter (Signed)
Called CB# home . No answer and no VM to leave message of need to return call for appt.

## 2012-11-03 NOTE — Telephone Encounter (Signed)
Was seen 19 days ago treated for yeast vaginitis, if not resolved needs OV

## 2012-11-03 NOTE — Telephone Encounter (Signed)
Patient notified of need for OV . Patient states will have to call back next week and will put up with it a  Little longer. Does not want to miss work or come when she has worked all day . States will call back next week .

## 2012-11-03 NOTE — Telephone Encounter (Signed)
Routed to triage 

## 2012-11-03 NOTE — Telephone Encounter (Signed)
Patient returned phone call to [facility].

## 2012-12-07 ENCOUNTER — Encounter: Payer: Self-pay | Admitting: Gynecology

## 2012-12-23 ENCOUNTER — Ambulatory Visit (INDEPENDENT_AMBULATORY_CARE_PROVIDER_SITE_OTHER): Payer: BC Managed Care – PPO | Admitting: Nurse Practitioner

## 2012-12-23 ENCOUNTER — Encounter: Payer: Self-pay | Admitting: Nurse Practitioner

## 2012-12-23 VITALS — BP 130/76 | HR 74 | Resp 14 | Ht 60.0 in | Wt 162.0 lb

## 2012-12-23 DIAGNOSIS — A499 Bacterial infection, unspecified: Secondary | ICD-10-CM

## 2012-12-23 DIAGNOSIS — N760 Acute vaginitis: Secondary | ICD-10-CM

## 2012-12-23 DIAGNOSIS — B9689 Other specified bacterial agents as the cause of diseases classified elsewhere: Secondary | ICD-10-CM

## 2012-12-23 MED ORDER — FLUCONAZOLE 150 MG PO TABS: 150.0000 mg | ORAL_TABLET | Freq: Once | ORAL | Status: DC

## 2012-12-23 MED ORDER — METRONIDAZOLE 0.75 % VA GEL: 1.0000 | Freq: Every day | VAGINAL | Status: DC

## 2012-12-23 NOTE — Progress Notes (Signed)
Subjective:     Patient ID: Alicia Parker, female   DOB: 1945/07/24, 67 y.o.   MRN: [id number]  HPI 67 yo AA Fe presents with recurrent vaginal discharge.  State she was seen on May 1st and treated for  Yeast infection with Terazol.  Symptoms did improve and now back again that is worse in past week.  Not sexually active since February. vaginal discharge with some odor but not itching. Denies urinary symptoms, fever or chills.   Review of Systems  Constitutional: Negative.  Negative for fever, chills and fatigue.  Respiratory: Negative.   Cardiovascular: Negative.   Gastrointestinal: Negative.   Genitourinary: Positive for vaginal discharge. Negative for dysuria, urgency, frequency, hematuria, flank pain, vaginal bleeding, vaginal pain, menstrual problem and pelvic pain.  Musculoskeletal: Negative.   Skin: Negative.   Neurological: Negative.   Psychiatric/Behavioral: Negative.  Negative for confusion.       Objective:   Physical Exam  Constitutional: She is oriented to person, place, and time. She appears well-developed and well-nourished.  Cardiovascular: Normal rate.   Abdominal: Soft. She exhibits no distension and no mass. There is no tenderness. There is no rebound and no guarding.  Genitourinary:  White thin vaginal discharge. No pain on bimanual or at the urethra.  No lesions. PH 5.5, NSS + clue cells, KOH negative.  Neurological: She is alert and oriented to person, place, and time.  Psychiatric: She has a normal mood and affect. Her behavior is normal. Judgment and thought content normal.       Assessment:     bacterial vaginitis Recent yeast vaginitis Atrophic vaginitis    Plan:     Will start on Metrogel vagina cream HS X 5  Follow with Diflucan if needed X 2 Since it has been sometime she she was last seen for AEX will get that scheduled - she may benefit from vaginal estrogen.

## 2012-12-23 NOTE — Patient Instructions (Signed)
Bacterial Vaginosis Bacterial vaginosis (BV) is a vaginal infection where the normal balance of bacteria in the vagina is disrupted. The normal balance is then replaced by an overgrowth of certain bacteria. There are several different kinds of bacteria that can cause BV. BV is the most common vaginal infection in women of childbearing age. CAUSES   The cause of BV is not fully understood. BV develops when there is an increase or imbalance of harmful bacteria.  Some activities or behaviors can upset the normal balance of bacteria in the vagina and put women at increased risk including:  Having a new sex partner or multiple sex partners.  Douching.  Using an intrauterine device (IUD) for contraception.  It is not clear what role sexual activity plays in the development of BV. However, women that have never had sexual intercourse are rarely infected with BV. Women do not get BV from toilet seats, bedding, swimming pools or from touching objects around them.  SYMPTOMS   Grey vaginal discharge.  A fish-like odor with discharge, especially after sexual intercourse.  Itching or burning of the vagina and vulva.  Burning or pain with urination.  Some women have no signs or symptoms at all. DIAGNOSIS  Your caregiver must examine the vagina for signs of BV. Your caregiver will perform lab tests and look at the sample of vaginal fluid through a microscope. They will look for bacteria and abnormal cells (clue cells), a pH test higher than 4.5, and a positive amine test all associated with BV.  RISKS AND COMPLICATIONS   Pelvic inflammatory disease (PID).  Infections following gynecology surgery.  Developing HIV.  Developing herpes virus. TREATMENT  Sometimes BV will clear up without treatment. However, all women with symptoms of BV should be treated to avoid complications, especially if gynecology surgery is planned. Female partners generally do not need to be treated. However, BV may spread  between female sex partners so treatment is helpful in preventing a recurrence of BV.   BV may be treated with antibiotics. The antibiotics come in either pill or vaginal cream forms. Either can be used with nonpregnant or pregnant women, but the recommended dosages differ. These antibiotics are not harmful to the baby.  BV can recur after treatment. If this happens, a second round of antibiotics will often be prescribed.  Treatment is important for pregnant women. If not treated, BV can cause a premature delivery, especially for a pregnant woman who had a premature birth in the past. All pregnant women who have symptoms of BV should be checked and treated.  For chronic reoccurrence of BV, treatment with a type of prescribed gel vaginally twice a week is helpful. HOME CARE INSTRUCTIONS   Finish all medication as directed by your caregiver.  Do not have sex until treatment is completed.  Tell your sexual partner that you have a vaginal infection. They should see their caregiver and be treated if they have problems, such as a mild rash or itching.  Practice safe sex. Use condoms. Only have 1 sex partner. PREVENTION  Basic prevention steps can help reduce the risk of upsetting the natural balance of bacteria in the vagina and developing BV:  Do not have sexual intercourse (be abstinent).  Do not douche.  Use all of the medicine prescribed for treatment of BV, even if the signs and symptoms go away.  Tell your sex partner if you have BV. That way, they can be treated, if needed, to prevent reoccurrence. SEEK MEDICAL CARE IF:     Your symptoms are not improving after 3 days of treatment.  You have increased discharge, pain, or fever. MAKE SURE YOU:   Understand these instructions.  Will watch your condition.  Will get help right away if you are not doing well or get worse. FOR MORE INFORMATION  Division of STD Prevention (DSTDP), Centers for Disease Control and Prevention:  www.cdc.gov/std American Social Health Association (ASHA): www.ashastd.org  Document Released: 06/02/2005 Document Revised: 08/25/2011 Document Reviewed: 11/23/2008 ExitCare Patient Information 2014 [employer].  

## 2012-12-25 NOTE — Progress Notes (Signed)
Encounter reviewed by Dr. Azula Zappia Silva.  

## 2013-01-03 ENCOUNTER — Telehealth: Payer: Self-pay | Admitting: Nurse Practitioner

## 2013-01-03 NOTE — Telephone Encounter (Signed)
Patient last OV 12/23/2012 APPT. For AEX 02/03/2013 Patient calling today. Stated used metrogel vaginal cream x 5 days and used the diflucan x2 , states got better but is not helped all the way. Now with vaginal discharge again and itching. Last  OV note stated might need vaginal estrogen. Please advise.

## 2013-01-03 NOTE — Telephone Encounter (Signed)
Patient calling re: bacterial infection no resolved. Please advise.

## 2013-01-04 ENCOUNTER — Other Ambulatory Visit: Payer: Self-pay | Admitting: Nurse Practitioner

## 2013-01-04 MED ORDER — FLUCONAZOLE 150 MG PO TABS: 150.0000 mg | ORAL_TABLET | Freq: Once | ORAL | Status: DC

## 2013-01-04 NOTE — Telephone Encounter (Signed)
Patient notified of need to have Rx of Diflucan today and may repeat in 3 days if symptoms still present. Will need OV if symptoms reoccur. Patient understands this.

## 2013-01-04 NOTE — Telephone Encounter (Signed)
Take Diflucan 150 mg today and repeat in 3 days.

## 2013-02-03 ENCOUNTER — Ambulatory Visit (INDEPENDENT_AMBULATORY_CARE_PROVIDER_SITE_OTHER): Payer: BC Managed Care – PPO | Admitting: Nurse Practitioner

## 2013-02-03 ENCOUNTER — Encounter: Payer: Self-pay | Admitting: Nurse Practitioner

## 2013-02-03 VITALS — BP 132/68 | HR 74 | Resp 14 | Ht 60.25 in | Wt 161.6 lb

## 2013-02-03 DIAGNOSIS — IMO0002 Reserved for concepts with insufficient information to code with codable children: Secondary | ICD-10-CM

## 2013-02-03 DIAGNOSIS — R6889 Other general symptoms and signs: Secondary | ICD-10-CM

## 2013-02-03 DIAGNOSIS — B373 Candidiasis of vulva and vagina: Secondary | ICD-10-CM

## 2013-02-03 DIAGNOSIS — Z0000 Encounter for general adult medical examination without abnormal findings: Secondary | ICD-10-CM

## 2013-02-03 LAB — POCT URINALYSIS DIPSTICK: pH, UA: 5.5

## 2013-02-03 MED ORDER — TERCONAZOLE 0.4 % VA CREA: 1.0000 | TOPICAL_CREAM | Freq: Every day | VAGINAL | Status: DC

## 2013-02-03 NOTE — Patient Instructions (Addendum)

## 2013-02-03 NOTE — Progress Notes (Signed)
67 y.o. [id number] Married African American Fe here for annual exam but this was done 09/28/12.  She does have ongoing symptoms of vaginal discharge and did use a dose of Metrogel last pm.  She does have history of abnormal pap with last one done 12/13 so is due for a repeat.  Patient's last menstrual period was 06/16/1997.          Sexually active: no  The current method of family planning is post menopausal status.    Exercising: no  The patient does not participate in regular exercise at present. Smoker:  yes  Health Maintenance: Pap:  2013  Negative MMG: 5/ 2013 normal Colonoscopy:  2007 BMD:   2002 TDaP:  2012 Labs: Hgb- 12.8    reports that she has been smoking Cigarettes.  She has been smoking about 0.50 packs per day. She does not have any smokeless tobacco history on file. She reports that she drinks about 3.6 ounces of alcohol per week. She reports that she does not use illicit drugs.  Past Medical History  Diagnosis Date  . GERD (gastroesophageal reflux disease)     Past Surgical History  Procedure Laterality Date  . Appendectomy  1968  . Cholecystectomy  1968  . Tubal ligation    . Reduction mammaplasty Bilateral     Current Outpatient Prescriptions  Medication Sig Dispense Refill  . fluconazole (DIFLUCAN) 150 MG tablet Take 1 tablet (150 mg total) by mouth once. Take one tablet.  Repeat in 48 hours if symptoms are not completely resolved.  2 tablet  0  . metroNIDAZOLE (METROGEL) 0.75 % vaginal gel Place 1 Applicatorful vaginally at bedtime.  70 g  0   No current facility-administered medications for this visit.    Family History  Problem Relation Age of Onset  . Hypertension Mother   . Stroke Mother   . Hypertension Father   . Cancer Father 55    Leukemia  . Early death Brother     pneumonia  . Hypertension Maternal Grandmother   . Early death Brother     ROS:  Pertinent items are noted in HPI.  Otherwise, a comprehensive ROS was negative.  Exam:   BP  132/68  Pulse 74  Resp 14  Ht 5' 0.25" (1.53 m)  Wt 161 lb 9.6 oz (73.301 kg)  BMI 31.31 kg/m2  LMP 06/16/1997 Height: 5' 0.25" (153 cm)  Ht Readings from Last 3 Encounters:  02/03/13 5' 0.25" (1.53 m)  12/23/12 5' (1.524 m)  09/28/12 5' (1.524 m)    General appearance: alert, cooperative and appears stated age Head: Normocephalic, without obvious abnormality, atraumatic Neck: no adenopathy, supple, symmetrical, trachea midline and thyroid normal to inspection and palpation Lungs: clear to auscultation bilaterally Breasts: normal appearance, no masses or tenderness Heart: regular rate and rhythm Abdomen: soft, non-tender; no masses,  no organomegaly Extremities: extremities normal, atraumatic, no cyanosis or edema Skin: Skin color, texture, turgor normal. No rashes or lesions Lymph nodes: Cervical, supraclavicular, and axillary nodes normal. No abnormal inguinal nodes palpated Neurologic: Grossly normal   Pelvic: External genitalia:  no lesions              Urethra:  normal appearing urethra with no masses, tenderness or lesions              Bartholin's and Skene's: normal                 Vagina: normal appearing vagina with normal color and discharge that  is clear to pale yellow, no lesions wet prep not done secondary to use of vaginal cream.              Cervix: anteverted              Pap taken: yes Bimanual Exam:  Uterus:  normal size, contour, position, consistency, mobility, non-tender              Adnexa: no mass, fullness, tenderness               Rectovaginal: Confirms               Anus:  normal sphincter tone, no lesions  A:  History of recurrent vaginitis  History of abnormal pap with colpo done 12/13?  P:   Pap smear if normal back to 1 year  Since has started Metrogel vaginal cream at home will go ahead and complete 5 days treatment  Then to start on Terazol vaginal cream at night X 7 return  prn  An After Visit Summary was printed and given to the  patient.

## 2013-02-04 NOTE — Progress Notes (Signed)
Encounter reviewed by Dr. Eboney Claybrook Silva.  

## 2013-02-07 LAB — IPS PAP TEST WITH REFLEX TO HPV

## 2013-02-21 ENCOUNTER — Telehealth: Payer: Self-pay | Admitting: Nurse Practitioner

## 2013-02-21 NOTE — Telephone Encounter (Signed)
Patient calling in regards to her lab results 

## 2013-02-22 ENCOUNTER — Telehealth: Payer: Self-pay | Admitting: Orthopedic Surgery

## 2013-02-22 ENCOUNTER — Other Ambulatory Visit: Payer: Self-pay | Admitting: Nurse Practitioner

## 2013-02-22 DIAGNOSIS — A599 Trichomoniasis, unspecified: Secondary | ICD-10-CM

## 2013-02-22 MED ORDER — METRONIDAZOLE 500 MG PO TABS: 500.0000 mg | ORAL_TABLET | Freq: Two times a day (BID) | ORAL | Status: DC

## 2013-02-22 NOTE — Telephone Encounter (Signed)
Patient returning phone call about a paper she had received

## 2013-02-22 NOTE — Telephone Encounter (Signed)
LMTCB  aa 

## 2013-02-24 ENCOUNTER — Telehealth: Payer: Self-pay | Admitting: Orthopedic Surgery

## 2013-02-24 NOTE — Telephone Encounter (Addendum)
LMTCB 02-22-13  for results.  aa

## 2013-02-24 NOTE — Telephone Encounter (Signed)
LMTCB for results. aa 

## 2013-02-24 NOTE — Telephone Encounter (Signed)
Spoke with pt about Pap results being normal, but that she did test positive for trichomonas. Advised PG sent a RX for Flagyl to her pharmacy for her to take twice daily for 7 days. Pt agreeable.

## 2013-02-24 NOTE — Telephone Encounter (Signed)
Calling Alicia Parker back if no answer at home try work goes in at [phone]

## 2013-04-19 ENCOUNTER — Other Ambulatory Visit: Payer: Self-pay

## 2013-04-19 DIAGNOSIS — Z1231 Encounter for screening mammogram for malignant neoplasm of breast: Secondary | ICD-10-CM

## 2013-04-21 ENCOUNTER — Ambulatory Visit
Admission: RE | Admit: 2013-04-21 | Discharge: 2013-04-21 | Disposition: A | Discharge status: Home / Self Care | Payer: BC Managed Care – PPO | Source: Ambulatory Visit

## 2013-04-21 DIAGNOSIS — Z1231 Encounter for screening mammogram for malignant neoplasm of breast: Secondary | ICD-10-CM

## 2013-10-15 ENCOUNTER — Other Ambulatory Visit: Payer: Self-pay | Admitting: Physician Assistant

## 2013-10-18 ENCOUNTER — Other Ambulatory Visit: Payer: Self-pay | Admitting: Physician Assistant

## 2014-02-10 ENCOUNTER — Ambulatory Visit: Payer: BC Managed Care – PPO | Admitting: Nurse Practitioner

## 2014-03-31 ENCOUNTER — Ambulatory Visit (INDEPENDENT_AMBULATORY_CARE_PROVIDER_SITE_OTHER): Payer: BC Managed Care – PPO | Admitting: Nurse Practitioner

## 2014-03-31 ENCOUNTER — Encounter: Payer: Self-pay | Admitting: Nurse Practitioner

## 2014-03-31 VITALS — BP 110/74 | HR 72 | Ht 60.0 in | Wt 166.0 lb

## 2014-03-31 DIAGNOSIS — Z0000 Encounter for general adult medical examination without abnormal findings: Secondary | ICD-10-CM

## 2014-03-31 DIAGNOSIS — Z01419 Encounter for gynecological examination (general) (routine) without abnormal findings: Secondary | ICD-10-CM

## 2014-03-31 DIAGNOSIS — Z1211 Encounter for screening for malignant neoplasm of colon: Secondary | ICD-10-CM

## 2014-03-31 LAB — POCT URINALYSIS DIPSTICK
Bilirubin, UA: NEGATIVE
Blood, UA: NEGATIVE
Glucose, UA: NEGATIVE
KETONES UA: NEGATIVE
LEUKOCYTES UA: NEGATIVE
NITRITE UA: NEGATIVE
PH UA: 6
PROTEIN UA: NEGATIVE
UROBILINOGEN UA: NEGATIVE

## 2014-03-31 LAB — HEMOGLOBIN, FINGERSTICK: Hemoglobin, fingerstick: 13.4 g/dL (ref 12.0–16.0)

## 2014-03-31 NOTE — Progress Notes (Signed)
Patient ID: Alicia Parker, female   DOB: 12/11/1945, 68 y.o.   MRN: [id number] 68 y.o. [id number] Married African American Fe here for annual exam.  Recent yeast and bacterial infection is resolved Will retire April 13, 2014.  Needs to have a repeat colonoscopy but Dr. Lajoyce Corners no longer here. She will need a referral.  Patient's last menstrual period was 06/16/1997.          Sexually active: Yes.    The current method of family planning is tubal ligation and post menopausal status.    Exercising: No.  The patient does not participate in regular exercise at present. Smoker:  Yes, 1 PPD  Health Maintenance: Pap:  02/03/13, WNL; abnormal in 2012 with normal in 2013 per patient MMG:  04/21/13, Bi-Rads 1:  Negative  Colonoscopy:  Due, history of polyps BMD:   12/22/01; T-Score: -0.5/-0.1 left hip TDaP:  10/26/09 Labs: HB:  13.4  Urine:  Negative    reports that she has been smoking Cigarettes.  She has a 10 pack-year smoking history. She does not have any smokeless tobacco history on file. She reports that she drinks about 3.6 ounces of alcohol per week. She reports that she does not use illicit drugs.  Past Medical History  Diagnosis Date  . GERD (gastroesophageal reflux disease)     Past Surgical History  Procedure Laterality Date  . Appendectomy  1968  . Cholecystectomy  1968  . Reduction mammaplasty Bilateral   . Tubal ligation  1968    No current outpatient prescriptions on file.   No current facility-administered medications for this visit.    Family History  Problem Relation Age of Onset  . Hypertension Mother   . Stroke Mother   . Hypertension Father   . Cancer Father 102    Leukemia  . Early death Brother     pneumonia  . Hypertension Maternal Grandmother   . Early death Brother     ROS:  Pertinent items are noted in HPI.  Otherwise, a comprehensive ROS was negative.  Exam:   BP 110/74  Pulse 72  Ht 5' (1.524 m)  Wt 166 lb (75.297 kg)  BMI 32.42 kg/m2  LMP  06/16/1997 Height: 5' (152.4 cm)  Ht Readings from Last 3 Encounters:  03/31/14 5' (1.524 m)  02/03/13 5' 0.25" (1.53 m)  12/23/12 5' (1.524 m)    General appearance: alert, cooperative and appears stated age Head: Normocephalic, without obvious abnormality, atraumatic Neck: no adenopathy, supple, symmetrical, trachea midline and thyroid normal to inspection and palpation Lungs: clear to auscultation bilaterally Breasts: normal appearance, no masses or tenderness, breast reduction scars Heart: regular rate and rhythm Abdomen: soft, non-tender; no masses,  no organomegaly Extremities: extremities normal, atraumatic, no cyanosis or edema Skin: Skin color, texture, turgor normal. No rashes or lesions Lymph nodes: Cervical, supraclavicular, and axillary nodes normal. No abnormal inguinal nodes palpated Neurologic: Grossly normal   Pelvic: External genitalia:  no lesions              Urethra:  normal appearing urethra with no masses, tenderness or lesions              Bartholin's and Skene's: normal                 Vagina: normal appearing vagina with normal color and discharge, no lesions              Cervix: anteverted  Pap taken: Yes.   Bimanual Exam:  Uterus:  normal size, contour, position, consistency, mobility, non-tender              Adnexa: no mass, fullness, tenderness               Rectovaginal: Confirms               Anus:  normal sphincter tone, no lesions  A:  Well Woman with normal exam  Menopausal no HRT   History of Abnormal Pap with biopsy for ASCUS + HR HPV  10/2011 and pap normal since  Colonoscopy due, history of polyps    Slight Atrophic Vaginitis  P:   Reviewed health and wellness pertinent to exam  Pap smear taken today  Mammogram is due 11/15  Will make a referral to GI - Dr. Angus Palms on breast self exam, mammography screening, adequate intake of calcium and vitamin D, diet and exercise return annually or prn  An After Visit Summary  was printed and given to the patient.

## 2014-03-31 NOTE — Patient Instructions (Addendum)

## 2014-04-02 NOTE — Progress Notes (Signed)
Encounter reviewed by Dr. Brook Silva.  

## 2014-04-05 LAB — IPS PAP TEST WITH HPV

## 2014-04-17 ENCOUNTER — Encounter: Payer: Self-pay | Admitting: Nurse Practitioner

## 2014-04-18 IMAGING — MG MM SCREEN MAMMOGRAM BILATERAL
4 series · 4 of 4 positions shown · non-contrast
Comparison: Previous exam(s).

CLINICAL DATA: Screening.

EXAM:
DIGITAL SCREENING BILATERAL MAMMOGRAM WITH CAD

[R CC]
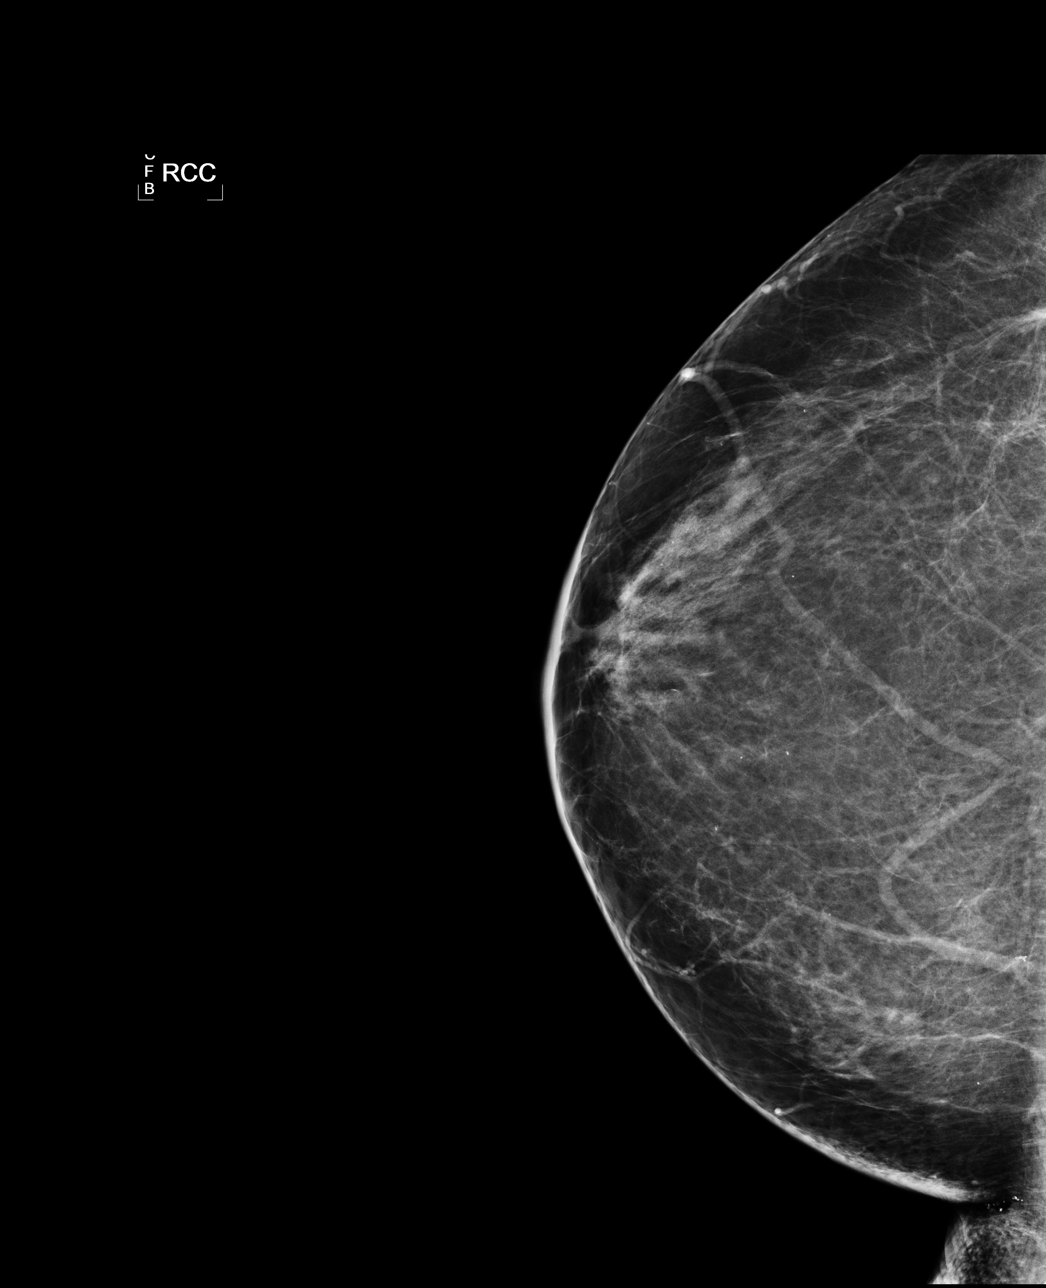

[L CC]
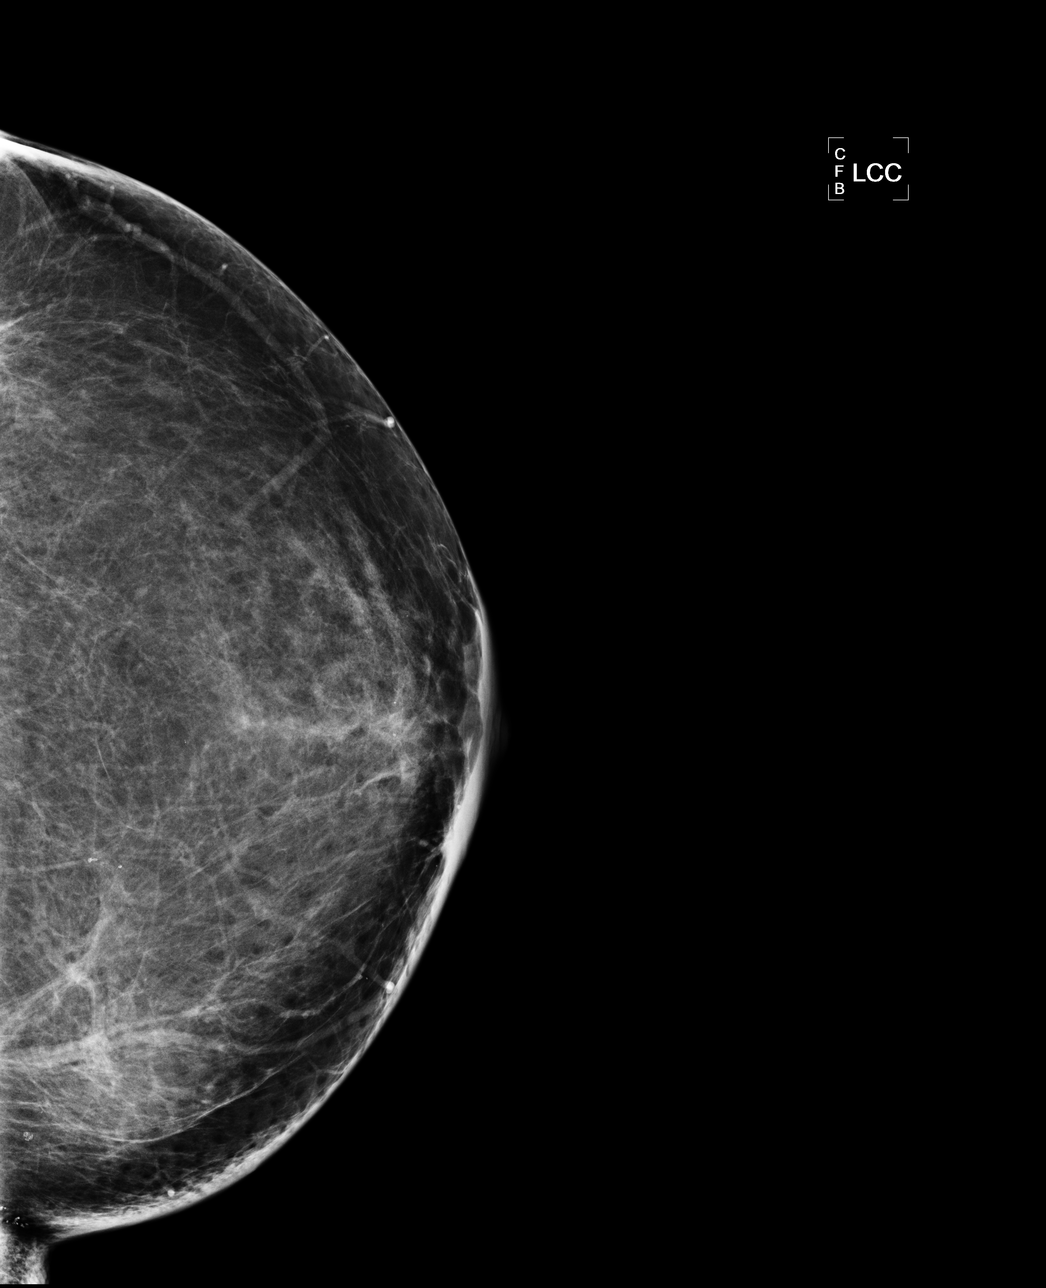

[L MLO]
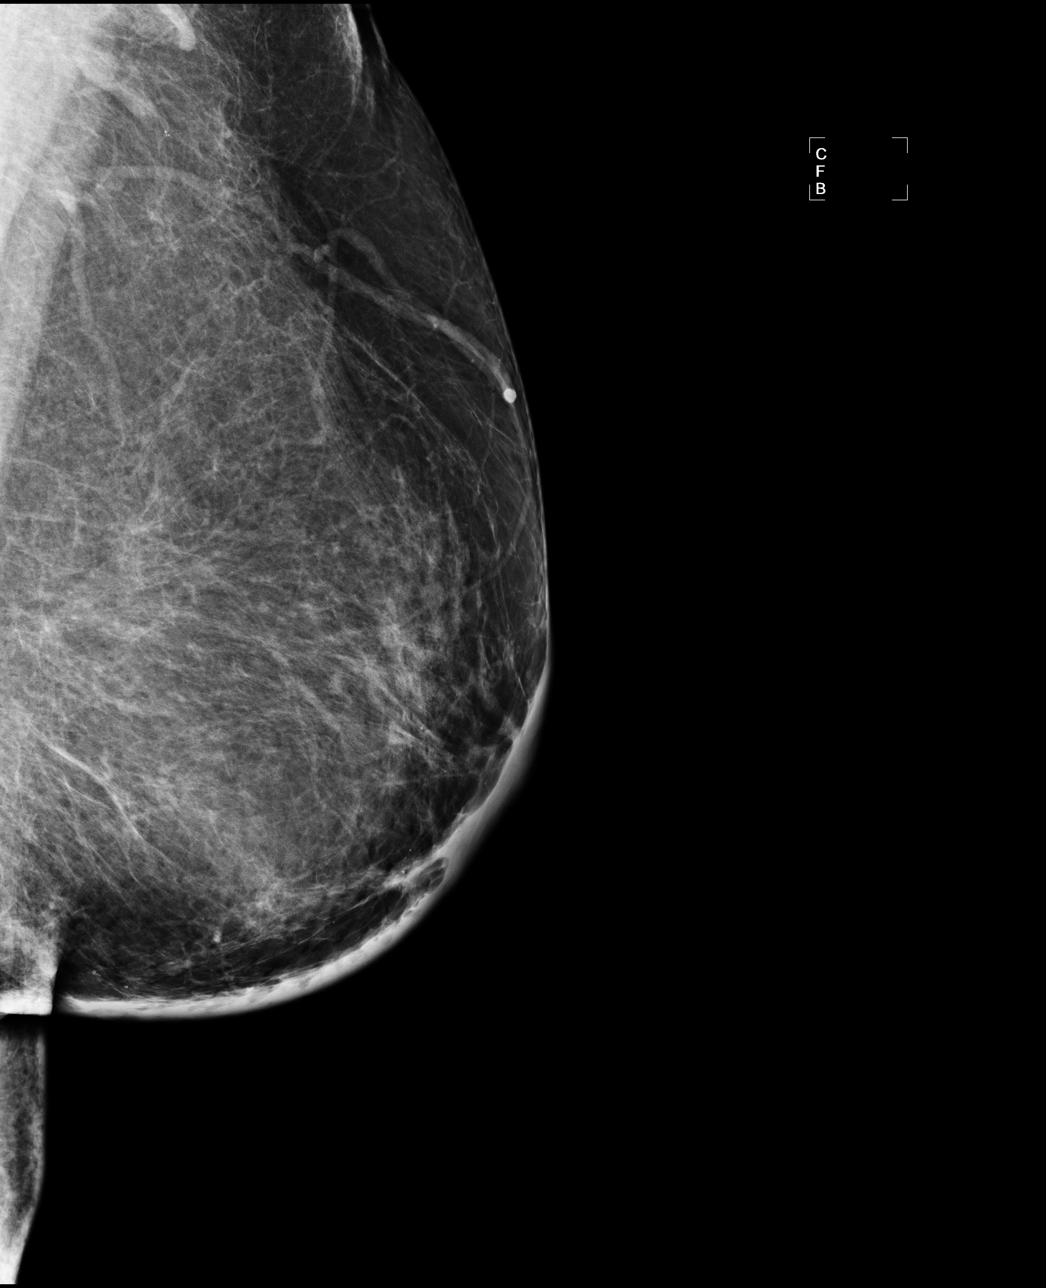

[R MLO]
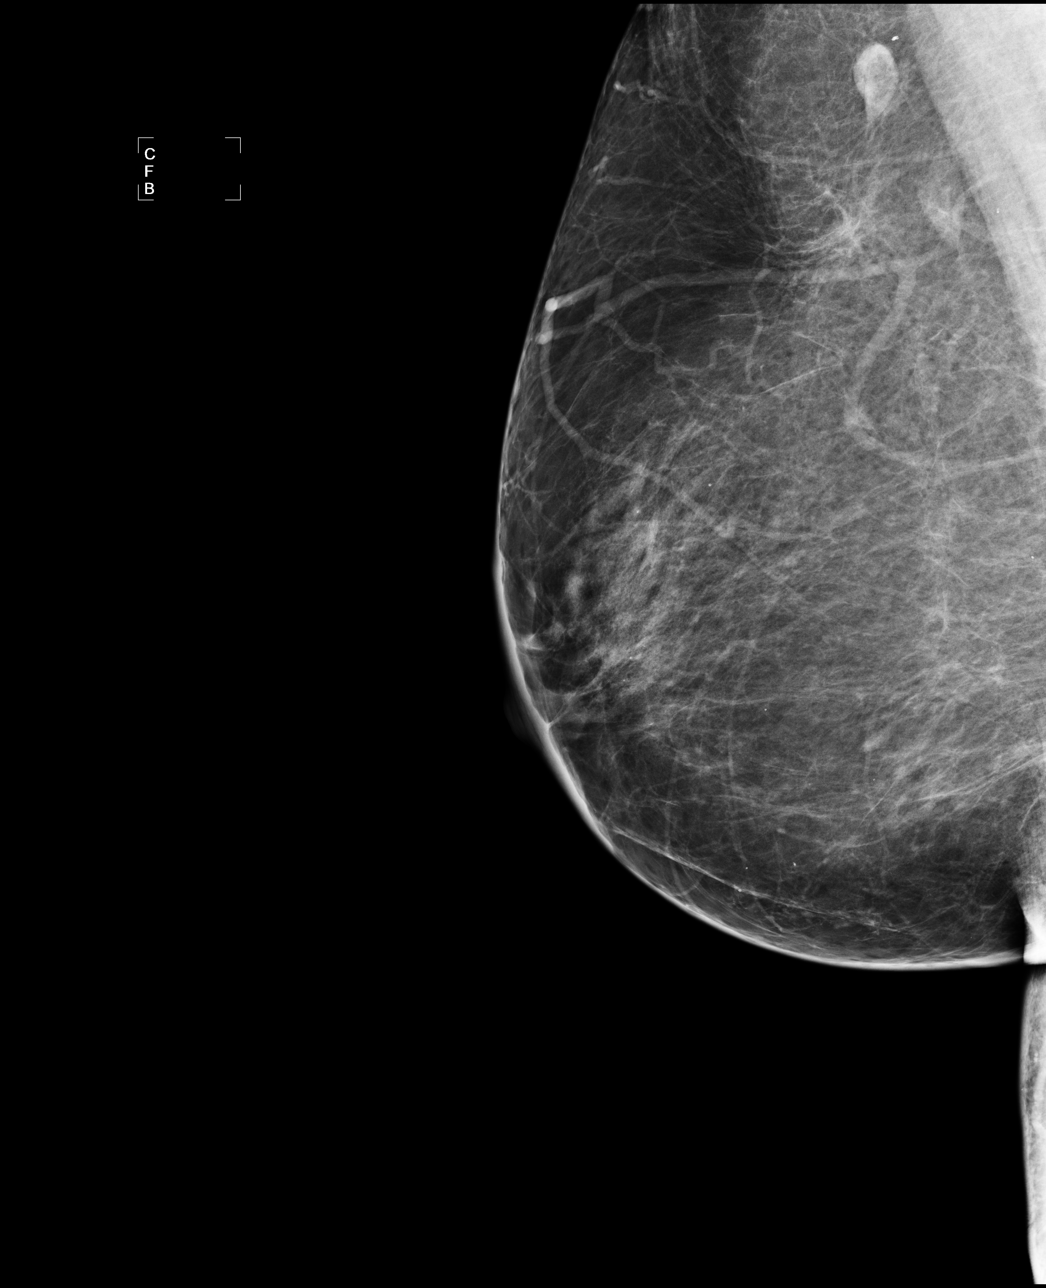

[4 of 4 positions shown; findings below may reference images not displayed]

ACR Breast Density Category b: There are scattered areas of
fibroglandular density.
FINDINGS: There are no findings suspicious for malignancy. Images were
processed with CAD.
IMPRESSION: No mammographic evidence of malignancy. A result letter of this
screening mammogram will be mailed directly to the patient.

RECOMMENDATION:
Screening mammogram in one year. (Code:[id number])

BI-RADS CATEGORY  1: Negative

## 2015-04-03 ENCOUNTER — Ambulatory Visit: Payer: BC Managed Care – PPO | Admitting: Nurse Practitioner

## 2015-04-25 ENCOUNTER — Ambulatory Visit (INDEPENDENT_AMBULATORY_CARE_PROVIDER_SITE_OTHER): Payer: Medicare Other

## 2015-04-25 ENCOUNTER — Ambulatory Visit (INDEPENDENT_AMBULATORY_CARE_PROVIDER_SITE_OTHER): Payer: Medicare Other | Admitting: Family Medicine

## 2015-04-25 VITALS — BP 130/78 | HR 77 | Temp 97.9°F | Resp 18 | Ht 60.0 in | Wt 168.2 lb

## 2015-04-25 DIAGNOSIS — R002 Palpitations: Secondary | ICD-10-CM

## 2015-04-25 DIAGNOSIS — R05 Cough: Secondary | ICD-10-CM | POA: Diagnosis not present

## 2015-04-25 DIAGNOSIS — K219 Gastro-esophageal reflux disease without esophagitis: Secondary | ICD-10-CM | POA: Diagnosis not present

## 2015-04-25 DIAGNOSIS — R059 Cough, unspecified: Secondary | ICD-10-CM

## 2015-04-25 DIAGNOSIS — R739 Hyperglycemia, unspecified: Secondary | ICD-10-CM

## 2015-04-25 DIAGNOSIS — R0789 Other chest pain: Secondary | ICD-10-CM

## 2015-04-25 DIAGNOSIS — G4489 Other headache syndrome: Secondary | ICD-10-CM | POA: Diagnosis not present

## 2015-04-25 LAB — POCT CBC
Granulocyte percent: 48.1 %G (ref 37–80)
HCT, POC: 42.1 % (ref 37.7–47.9)
Hemoglobin: 13.5 g/dL (ref 12.2–16.2)
Lymph, poc: 2.4 (ref 0.6–3.4)
MCH, POC: 27.8 pg (ref 27–31.2)
MCHC: 32 g/dL (ref 31.8–35.4)
MCV: 86.9 fL (ref 80–97)
MID (cbc): 0.4 (ref 0–0.9)
MPV: 6.7 fL (ref 0–99.8)
POC Granulocyte: 2.5 (ref 2–6.9)
POC LYMPH PERCENT: 45.2 % (ref 10–50)
POC MID %: 6.7 %M (ref 0–12)
Platelet Count, POC: 236 10*3/uL (ref 142–424)
RBC: 4.84 M/uL (ref 4.04–5.48)
RDW, POC: 14.6 %
WBC: 5.3 10*3/uL (ref 4.6–10.2)

## 2015-04-25 LAB — TSH: TSH: 1.435 u[IU]/mL (ref 0.350–4.500)

## 2015-04-25 LAB — GLUCOSE, POCT (MANUAL RESULT ENTRY): POC Glucose: 96 mg/dL (ref 70–99)

## 2015-04-25 MED ORDER — GI COCKTAIL ~~LOC~~: 30.0000 mL | Freq: Once | ORAL | Status: DC

## 2015-04-25 NOTE — Progress Notes (Signed)
 Chief Complaint:  Chief Complaint  Patient presents with  . Headache  . Ear Pain    left ear  . OTHER    chest pressure in the night    HPI: Alicia Parker is a 69 y.o. 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 It was normal. Went to see optometrist and he states that  She has no gluacoma, she does have cataracts. She was not coughing when it happened. She was sneezing and coughing off and on prior, she did have some allergy symtpoms. She also had some some chest presure in mid her chest for last 2 weeks, she had GERD and took Nexium yesterday, and has not had releif so far, then in her throat she felt something got stuck in her throat. This occurred late last night. She has had the chest pressure fr o 2 weeks. She states it stays in her throat, and esopagus. She had some left ear pain. No nausea, she threw up one time whne she had it stuck in her throat, she was trying to make whatever was stuck  go down. Chest pressure is not related to activity. Deneis CVA sxs ie dizziness, vision changes, slurred speech, confusion, weakness, n/w/t  Denies HTN, DM, hyperlipidemia, family hx of MI  + smoker   Past Medical History  Diagnosis Date  . GERD (gastroesophageal reflux disease)    Past Surgical History  Procedure Laterality Date  . Appendectomy  1968  . Cholecystectomy  1968  . Reduction mammaplasty Bilateral   . Tubal ligation  1968   Social History   Social History  . Marital Status: Married    Spouse Name: N/A  . Number of Children: N/A  . Years of Education: N/A   Social History Main Topics  . Smoking status: Current Every Day Smoker -- 1.00 packs/day for 10 years    Types: Cigarettes  . Smokeless tobacco: None  . Alcohol Use: 3.6 oz/week    6 Cans of beer per week  . Drug Use: No  . Sexual Activity: No     Comment: btl    Other Topics Concern  . None   Social History Narrative   Family History  Problem Relation Age of Onset  . Hypertension Mother   . Stroke Mother   . Hypertension Father   . Cancer Father 16    Leukemia  . Early death Brother     pneumonia  . Hypertension Maternal Grandmother   . Early death Brother    No Known Allergies Prior to Admission medications   Medication Sig Start Date End Date Taking? Authorizing Provider  esomeprazole (NEXIUM) 10 MG packet Take 10 mg by mouth daily before breakfast.   Yes Historical Provider, MD  omega-3 acid ethyl esters (LOVAZA) 1 G capsule Take by mouth 2 (two) times daily.   Yes Historical Provider, MD     ROS: The patient denies fevers, chills, night sweats, unintentional weight loss, palpitations, wheezing, dyspnea on exertion, nausea, vomiting, abdominal pain, dysuria, hematuria, melena, numbness, weakness, or tingling.   All other systems have been reviewed and were otherwise negative with the exception of those mentioned in the HPI and as above.    PHYSICAL EXAM: Filed Vitals:   04/25/15 1554  BP: 130/78  Pulse: 77  Temp: 97.9 F (  36.6 C)  Resp: 18   SpO2 Readings from Last 3 Encounters:  04/25/15 97%  08/01/12 97%    Body mass index is 32.85 kg/(m^2).   General: Alert, no acute distress HEENT:  Normocephalic, atraumatic, oropharynx patent. EOMI, PERRLA, fundo exam normal , Tm normal, min +/- sinus pressure  Cardiovascular:  Regular rate and rhythm, no rubs murmurs or gallops.  No Carotid bruits, radial pulse intact. No pedal edema.  Respiratory: Clear to auscultation bilaterally.  No wheezes, rales, or rhonchi.  No cyanosis, no use of accessory musculature Abdominal: No organomegaly, abdomen is soft and non-tender, positive bowel sounds. No masses. Skin: No rashes. Neurologic: Facial musculature symmetric. CN 2-12 grossly normal  Psychiatric: Patient acts appropriately throughout our interaction. Lymphatic: No cervical or  submandibular lymphadenopathy Musculoskeletal: Gait intact. No edema, tenderness   LABS: Results for orders placed or performed in visit on 04/25/15  POCT CBC  Result Value Ref Range   WBC 5.3 4.6 - 10.2 K/uL   Lymph, poc 2.4 0.6 - 3.4   POC LYMPH PERCENT 45.2 10 - 50 %L   MID (cbc) 0.4 0 - 0.9   POC MID % 6.7 0 - 12 %M   POC Granulocyte 2.5 2 - 6.9   Granulocyte percent 48.1 37 - 80 %G   RBC 4.84 4.04 - 5.48 M/uL   Hemoglobin 13.5 12.2 - 16.2 g/dL   HCT, POC 42.1 37.7 - 47.9 %   MCV 86.9 80 - 97 fL   MCH, POC 27.8 27 - 31.2 pg   MCHC 32.0 31.8 - 35.4 g/dL   RDW, POC 14.6 %   Platelet Count, POC 236 142 - 424 K/uL   MPV 6.7 0 - 99.8 fL  POCT glucose (manual entry)  Result Value Ref Range   POC Glucose 96 70 - 99 mg/dl     EKG/XRAY:   Primary read interpreted by Dr. Marin Comment at [facility]. No acute cardiomegaly   ASSESSMENT/PLAN: Encounter Diagnoses  Name Primary?  . Other chest pain Yes  . Palpitations   . Hyperglycemia   . Cough   . Gastroesophageal reflux disease without esophagitis   . Other headache syndrome    Possible sinus headaches, stress headaches Consider allergy meds She was given GI cocktail to take, risk factors for typical CP are low, labs pending, xray and EKG reassuring Will fu with labs, precautiosn given to go to ER, again this has bee going on for 2 weeks.   Gross sideeffects, risk and benefits, and alternatives of medications d/w patient. Patient is aware that all medications have potential sideeffects and we are unable to predict every sideeffect or drug-drug interaction that may occur.    DO  04/25/2015 6:19 PM

## 2015-04-25 NOTE — Patient Instructions (Signed)
Migraine Headache A migraine headache is an intense, throbbing pain on one or both sides of your head. A migraine can last for 30 minutes to several hours. CAUSES  The exact cause of a migraine headache is not always known. However, a migraine may be caused when nerves in the brain become irritated and release chemicals that cause inflammation. This causes pain. Certain things may also trigger migraines, such as:  Alcohol.  Smoking.  Stress.  Menstruation.  Aged cheeses.  Foods or drinks that contain nitrates, glutamate, aspartame, or tyramine.  Lack of sleep.  Chocolate.  Caffeine.  Hunger.  Physical exertion.  Fatigue.  Medicines used to treat chest pain (nitroglycerine), birth control pills, estrogen, and some blood pressure medicines. SIGNS AND SYMPTOMS  Pain on one or both sides of your head.  Pulsating or throbbing pain.  Severe pain that prevents daily activities.  Pain that is aggravated by any physical activity.  Nausea, vomiting, or both.  Dizziness.  Pain with exposure to bright lights, loud noises, or activity.  General sensitivity to bright lights, loud noises, or smells. Before you get a migraine, you may get warning signs that a migraine is coming (aura). An aura may include:  Seeing flashing lights.  Seeing bright spots, halos, or zigzag lines.  Having tunnel vision or blurred vision.  Having feelings of numbness or tingling.  Having trouble talking.  Having muscle weakness. DIAGNOSIS  A migraine headache is often diagnosed based on:  Symptoms.  Physical exam.  A CT scan or MRI of your head. These imaging tests cannot diagnose migraines, but they can help rule out other causes of headaches. TREATMENT Medicines may be given for pain and nausea. Medicines can also be given to help prevent recurrent migraines.  HOME CARE INSTRUCTIONS  Only take over-the-counter or prescription medicines for pain or discomfort as directed by your  health care provider. The use of long-term narcotics is not recommended.  Lie down in a dark, quiet room when you have a migraine.  Keep a journal to find out what may trigger your migraine headaches. For example, write down:  What you eat and drink.  How much sleep you get.  Any change to your diet or medicines.  Limit alcohol consumption.  Quit smoking if you smoke.  Get 7-9 hours of sleep, or as recommended by your health care provider.  Limit stress.  Keep lights dim if bright lights bother you and make your migraines worse. SEEK IMMEDIATE MEDICAL CARE IF:   Your migraine becomes severe.  You have a fever.  You have a stiff neck.  You have vision loss.  You have muscular weakness or loss of muscle control.  You start losing your balance or have trouble walking.  You feel faint or pass out.  You have severe symptoms that are different from your first symptoms. MAKE SURE YOU:   Understand these instructions.  Will watch your condition.  Will get help right away if you are not doing well or get worse.   This information is not intended to replace advice given to you by your health care provider. Make sure you discuss any questions you have with your health care provider.   Document Released: 06/02/2005 Document Revised: 06/23/2014 Document Reviewed: 02/07/2013 Elsevier Interactive Patient Education 2016 [employer] Inc.  

## 2015-04-26 LAB — COMPLETE METABOLIC PANEL WITH GFR
AST: 15 U/L (ref 10–35)
BUN: 14 mg/dL (ref 7–25)
Calcium: 9.5 mg/dL (ref 8.6–10.4)
Chloride: 103 mmol/L (ref 98–110)
GFR, Est Non African American: 52 mL/min — ABNORMAL LOW (ref >=60)
Glucose, Bld: 88 mg/dL (ref 65–99)
Potassium: 5.2 mmol/L (ref 3.5–5.3)
Sodium: 141 mmol/L (ref 135–146)

## 2015-04-26 LAB — COMPLETE METABOLIC PANEL WITHOUT GFR
ALT: 12 U/L (ref 6–29)
Albumin: 3.9 g/dL (ref 3.6–5.1)
Alkaline Phosphatase: 89 U/L (ref 33–130)
CO2: 32 mmol/L — ABNORMAL HIGH (ref 20–31)
Creat: 1.09 mg/dL — ABNORMAL HIGH (ref 0.50–0.99)
GFR, Est African American: 60 mL/min (ref >=60)
Total Bilirubin: 0.4 mg/dL (ref 0.2–1.2)
Total Protein: 6.5 g/dL (ref 6.1–8.1)

## 2015-04-27 ENCOUNTER — Telehealth: Payer: Self-pay | Admitting: Family Medicine

## 2015-04-27 NOTE — Telephone Encounter (Signed)
Unable to leave message about labs, no VM

## 2015-06-17 DIAGNOSIS — Z860100 Personal history of colon polyps, unspecified: Secondary | ICD-10-CM

## 2015-06-17 HISTORY — DX: Personal history of colon polyps, unspecified: Z86.0100

## 2015-06-17 HISTORY — DX: Personal history of colonic polyps: Z86.010

## 2015-08-22 ENCOUNTER — Encounter: Payer: Self-pay | Admitting: Gastroenterology

## 2015-08-31 ENCOUNTER — Encounter: Payer: Self-pay | Admitting: Family Medicine

## 2015-10-08 ENCOUNTER — Ambulatory Visit (AMBULATORY_SURGERY_CENTER): Payer: Self-pay | Admitting: *Deleted

## 2015-10-08 VITALS — Ht 60.0 in | Wt 169.0 lb

## 2015-10-08 DIAGNOSIS — Z86010 Personal history of colonic polyps: Secondary | ICD-10-CM

## 2015-10-08 NOTE — Progress Notes (Signed)
No egg or soy allergy known to patient  No issues with past sedation with any surgeries  or procedures, no intubation problems  No diet pills per patient No home 02 use per patient  No blood thinners per patient  Pt denies issues with constipation   

## 2015-10-22 ENCOUNTER — Ambulatory Visit (AMBULATORY_SURGERY_CENTER): Payer: Medicare HMO | Admitting: Gastroenterology

## 2015-10-22 ENCOUNTER — Encounter: Payer: Self-pay | Admitting: Gastroenterology

## 2015-10-22 VITALS — BP 122/62 | HR 60 | Temp 96.8°F | Resp 19 | Ht 60.0 in | Wt 169.0 lb

## 2015-10-22 DIAGNOSIS — Z860100 Personal history of colon polyps, unspecified: Secondary | ICD-10-CM

## 2015-10-22 DIAGNOSIS — D123 Benign neoplasm of transverse colon: Secondary | ICD-10-CM

## 2015-10-22 DIAGNOSIS — D128 Benign neoplasm of rectum: Secondary | ICD-10-CM | POA: Diagnosis not present

## 2015-10-22 DIAGNOSIS — D129 Benign neoplasm of anus and anal canal: Secondary | ICD-10-CM

## 2015-10-22 MED ORDER — SODIUM CHLORIDE 0.9 % IV SOLN: 500.0000 mL | INTRAVENOUS | Status: DC

## 2015-10-22 NOTE — Op Note (Signed)
[facility] Patient Name: Alicia Parker Procedure Date: 10/22/2015 8:46 AM MRN: [id number] Endoscopist: Gordon. Loletha Carrow , MD Age: 70 Date of Birth: 06/02/1946 Gender: Female Procedure:                Colonoscopy Indications:              Screening for colorectal malignant neoplasm Medicines:                Monitored Anesthesia Care Procedure:                Pre-Anesthesia Assessment:                           - Prior to the procedure, a History and Physical                            was performed, and patient medications and                            allergies were reviewed. The patient's tolerance of                            previous anesthesia was also reviewed. The risks                            and benefits of the procedure and the sedation                            options and risks were discussed with the patient.                            All questions were answered, and informed consent                            was obtained. Prior Anticoagulants: The patient has                            taken no previous anticoagulant or antiplatelet                            agents. ASA Grade Assessment: II - A patient with                            mild systemic disease. After reviewing the risks                            and benefits, the patient was deemed in                            satisfactory condition to undergo the procedure.                           After obtaining informed consent, the colonoscope  was passed under direct vision. Throughout the                            procedure, the patient's blood pressure, pulse, and                            oxygen saturations were monitored continuously. The                            Model PCF-H190L [id number]) scope was introduced                            through the anus and advanced to the the cecum,                            identified by appendiceal orifice and ileocecal                     valve. The colonoscopy was performed without                            difficulty. The patient tolerated the procedure                            well. The quality of the bowel preparation was                            good. The ileocecal valve, appendiceal orifice, and                            rectum were photographed. The bowel preparation                            used was Miralax. Scope In: 8:53:38 AM Scope Out: 9:12:06 AM Scope Withdrawal Time: 0 hours 12 minutes 47 seconds  Total Procedure Duration: 0 hours 18 minutes 28 seconds  Findings:                 The perianal and digital rectal examinations were                            normal.                           Two sessile polyps were found in the distal                            transverse colon. The polyps were 4 mm in size.                            These polyps were removed with a cold snare.                            Resection and retrieval were complete.  A 8 mm polyp was found in the rectum. The polyp was                            sessile. The polyp was removed with a hot snare.                            Resection and retrieval were complete.                           Diverticula were found in the left colon.                           Internal hemorrhoids were found during                            retroflexion. The hemorrhoids were small and Grade                            I (internal hemorrhoids that do not prolapse).                           The exam was otherwise without abnormality. Complications:            No immediate complications. Estimated Blood Loss:     Estimated blood loss: none. Impression:               - Two 4 mm polyps in the distal transverse colon,                            removed with a cold snare. Resected and retrieved.                           - One 8 mm polyp in the rectum, removed with a hot                            snare. Resected and  retrieved.                           - Diverticulosis in the left colon.                           - Internal hemorrhoids.                           - The examination was otherwise normal. Recommendation:           - Patient has a contact number available for                            emergencies. The signs and symptoms of potential                            delayed complications were discussed with the  patient. Return to normal activities tomorrow.                            Written discharge instructions were provided to the                            patient.                           - Resume previous diet.                           - Continue present medications.                           - No aspirin, ibuprofen, naproxen, or other                            non-steroidal anti-inflammatory drugs for 5 days                            after polyp removal.                           - Await pathology results.                           - Repeat colonoscopy is recommended for                            surveillance. The colonoscopy date will be                            determined after pathology results from today's                            exam become available for review. Henry L. Loletha Carrow, MD 10/22/2015 9:18:47 AM This report has been signed electronically.

## 2015-10-22 NOTE — Patient Instructions (Signed)
YOU HAD AN ENDOSCOPIC PROCEDURE TODAY AT [facility]:   Refer to the procedure report that was given to you for any specific questions about what was found during the examination.  If the procedure report does not answer your questions, please call your gastroenterologist to clarify.  If you requested that your care partner not be given the details of your procedure findings, then the procedure report has been included in a sealed envelope for you to review at your convenience later.  YOU SHOULD EXPECT: Some feelings of bloating in the abdomen. Passage of more gas than usual.  Walking can help get rid of the air that was put into your GI tract during the procedure and reduce the bloating. If you had a lower endoscopy (such as a colonoscopy or flexible sigmoidoscopy) you may notice spotting of blood in your stool or on the toilet paper. If you underwent a bowel prep for your procedure, you may not have a normal bowel movement for a few days.  Please Note:  You might notice some irritation and congestion in your nose or some drainage.  This is from the oxygen used during your procedure.  There is no need for concern and it should clear up in a day or so.  SYMPTOMS TO REPORT IMMEDIATELY:   Following lower endoscopy (colonoscopy or flexible sigmoidoscopy):  Excessive amounts of blood in the stool  Significant tenderness or worsening of abdominal pains  Swelling of the abdomen that is new, acute  Fever of 100F or higher For urgent or emergent issues, a gastroenterologist can be reached at any hour by calling [phone].   DIET: Your first meal following the procedure should be a small meal and then it is ok to progress to your normal diet. Heavy or fried foods are harder to digest and may make you feel nauseous or bloated.  Likewise, meals heavy in dairy and vegetables can increase bloating.  Drink plenty of fluids but you should avoid alcoholic beverages for 24 hours.  ACTIVITY:   You should plan to take it easy for the rest of today and you should NOT DRIVE or use heavy machinery until tomorrow (because of the sedation medicines used during the test).   No ASA products for 5 days after your procedure. Pease read all your handouts given to you today.  FOLLOW UP: Our staff will call the number listed on your records the next business day following your procedure to check on you and address any questions or concerns that you may have regarding the information given to you following your procedure. If we do not reach you, we will leave a message.  However, if you are feeling well and you are not experiencing any problems, there is no need to return our call.  We will assume that you have returned to your regular daily activities without incident.  If any biopsies were taken you will be contacted by phone or by letter within the next 1-3 weeks.  Please call us at [phone] if you have not heard about the biopsies in 3 weeks.    SIGNATURES/CONFIDENTIALITY: You and/or your care partner have signed paperwork which will be entered into your electronic medical record.  These signatures attest to the fact that that the information above on your After Visit Summary has been reviewed and is understood.  Full responsibility of the confidentiality of this discharge information lies with you and/or your care-partner.  Thank you for letting us take care of  your healthcare needs today.

## 2015-10-22 NOTE — Progress Notes (Signed)
To pacu vss patent aw report to rn 

## 2015-10-22 NOTE — Progress Notes (Signed)
Called to room to assist during endoscopic procedure.  Patient ID and intended procedure confirmed with present staff. Received instructions for my participation in the procedure from the performing physician.  

## 2015-10-22 NOTE — Progress Notes (Signed)
Hot polyp wAS in two pieces; unable to get back into report.

## 2015-10-23 ENCOUNTER — Telehealth: Payer: Self-pay | Admitting: *Deleted

## 2015-10-23 NOTE — Telephone Encounter (Signed)
  Follow up Call-  Call back number 10/22/2015  Post procedure Call Back phone  # [phone]  Permission to leave phone message Yes     Patient questions:  Do you have a fever, pain , or abdominal swelling? No. Pain Score  0 *  Have you tolerated food without any problems? Yes.    Have you been able to return to your normal activities? Yes.    Do you have any questions about your discharge instructions: Diet   No. Medications  No. Follow up visit  No.  Do you have questions or concerns about your Care? No.  Actions: * If pain score is 4 or above: No action needed, pain <4.

## 2015-10-26 ENCOUNTER — Encounter: Payer: Self-pay | Admitting: Gastroenterology

## 2015-11-19 ENCOUNTER — Other Ambulatory Visit: Payer: Self-pay | Admitting: Internal Medicine

## 2015-11-19 DIAGNOSIS — E2839 Other primary ovarian failure: Secondary | ICD-10-CM

## 2015-11-19 DIAGNOSIS — Z1231 Encounter for screening mammogram for malignant neoplasm of breast: Secondary | ICD-10-CM

## 2015-12-03 ENCOUNTER — Ambulatory Visit
Admission: RE | Admit: 2015-12-03 | Discharge: 2015-12-03 | Disposition: A | Discharge status: Home / Self Care | Payer: Medicare HMO | Source: Ambulatory Visit | Attending: Internal Medicine | Admitting: Internal Medicine

## 2015-12-03 ENCOUNTER — Ambulatory Visit (INDEPENDENT_AMBULATORY_CARE_PROVIDER_SITE_OTHER): Payer: Medicare HMO | Admitting: Family Medicine

## 2015-12-03 VITALS — BP 138/76 | HR 60 | Temp 97.8°F | Resp 16 | Ht 60.0 in | Wt 168.0 lb

## 2015-12-03 DIAGNOSIS — R21 Rash and other nonspecific skin eruption: Secondary | ICD-10-CM

## 2015-12-03 DIAGNOSIS — Z0000 Encounter for general adult medical examination without abnormal findings: Secondary | ICD-10-CM

## 2015-12-03 DIAGNOSIS — E2839 Other primary ovarian failure: Secondary | ICD-10-CM

## 2015-12-03 DIAGNOSIS — Z1231 Encounter for screening mammogram for malignant neoplasm of breast: Secondary | ICD-10-CM

## 2015-12-03 MED ORDER — ZOSTER VACCINE LIVE 19400 UNT/0.65ML ~~LOC~~ SUSR: 0.6500 mL | Freq: Once | SUBCUTANEOUS | Status: DC

## 2015-12-03 MED ORDER — MUPIROCIN 2 % EX OINT: 1.0000 "application " | TOPICAL_OINTMENT | Freq: Two times a day (BID) | CUTANEOUS | Status: DC

## 2015-12-03 NOTE — Progress Notes (Signed)
This is 70 year old woman who is retired and comes in with midline lumbar rash which she's had for a week. His vesicular nature and she was concerned it might be shingles.. There is no pain involved and she's had no fever. There is no other rash in the flank or in the abdomen.  Objective:  BP 138/76 mmHg  Pulse 60  Temp(Src) 97.8 F (36.6 C) (Oral)  Resp 16  Ht 5' (1.524 m)  Wt 168 lb (76.204 kg)  BMI 32.81 kg/m2  SpO2 98%  LMP 06/16/1997 Cluster of perhaps 10 vesicles and a 1/2 cm circumscribed area over L3 in the midline back. Her graft assessment: This this has some features of shingles, nothing else fits. I believe that it is more of a bacterial infection.  Assessment: This sick or rash, presumed bacterial  Plan: Bactroban ointment 3 times a day and return if not resolved in a week Rash and nonspecific skin eruption - Plan: mupirocin ointment (BACTROBAN) 2 %  Routine general medical examination at a health care facility - Plan: Zoster Vaccine Live, PF, (ZOSTAVAX) 01027 UNT/0.65ML injection   Signed, Carola Frost.D.

## 2015-12-03 NOTE — Patient Instructions (Signed)
     IF you received an x-ray today, you will receive an invoice from [facility]. Please contact [facility] at [phone] with questions or concerns regarding your invoice.   IF you received labwork today, you will receive an invoice from [facility]. Please contact [facility] at [phone] with questions or concerns regarding your invoice.   Our billing staff will not be able to assist you with questions regarding bills from these companies.  You will be contacted with the lab results as soon as they are available. The fastest way to get your results is to activate your My Chart account. Instructions are located on the last page of this paperwork. If you have not heard from us regarding the results in 2 weeks, please contact this office.      

## 2016-09-15 ENCOUNTER — Ambulatory Visit (INDEPENDENT_AMBULATORY_CARE_PROVIDER_SITE_OTHER): Payer: Medicare HMO | Admitting: Physician Assistant

## 2016-09-15 VITALS — BP 137/74 | HR 72 | Temp 98.1°F | Resp 14 | Ht 60.0 in | Wt 174.0 lb

## 2016-09-15 DIAGNOSIS — R05 Cough: Secondary | ICD-10-CM

## 2016-09-15 DIAGNOSIS — H9201 Otalgia, right ear: Secondary | ICD-10-CM | POA: Diagnosis not present

## 2016-09-15 DIAGNOSIS — R059 Cough, unspecified: Secondary | ICD-10-CM

## 2016-09-15 MED ORDER — AMOXICILLIN 875 MG PO TABS: 875.0000 mg | ORAL_TABLET | Freq: Two times a day (BID) | ORAL | 0 refills | Status: DC

## 2016-09-15 MED ORDER — BENZONATATE 200 MG PO CAPS: 200.0000 mg | ORAL_CAPSULE | Freq: Two times a day (BID) | ORAL | 0 refills | Status: DC | PRN

## 2016-09-15 NOTE — Progress Notes (Signed)
09/15/2016 2:48 PM   DOB: [birth year] / MRN: [id number]  SUBJECTIVE:  Alicia Parker is a 71 y.o. female presenting for right ear pain and congestion that started last night. Tells me this is a head cold.  She associates cough.  Feels that she is getting better overall, however the ear is getting worse. This all started 4 days ago.  Denies a history of asthma.  She has an 18 pack year history of smoking.   She has No Known Allergies.   She  has a past medical history of Cataract; GERD (gastroesophageal reflux disease); Hyperlipidemia; and Sleep apnea.    She  reports that she has been smoking Cigarettes.  She has a 5.00 pack-year smoking history. She has never used smokeless tobacco. She reports that she drinks about 3.6 oz of alcohol per week . She reports that she does not use drugs. She  reports that she does not engage in sexual activity. The patient  has a past surgical history that includes Appendectomy (1968); Cholecystectomy (1968); Reduction mammaplasty (Bilateral); Tubal ligation (1968); Toe Surgery; Colonoscopy (2004); and Upper gastrointestinal endoscopy.  Her family history includes Cancer (age of onset: 4) in her father; Colon polyps in her sister; Early death in her brother and brother; Hypertension in her father, maternal grandmother, and mother; Stroke in her mother.  Review of Systems  Constitutional: Negative for fever.  HENT: Positive for ear pain and sore throat (improving). Negative for hearing loss.   Respiratory: Negative for cough, hemoptysis, sputum production, shortness of breath and wheezing.   Cardiovascular: Negative for chest pain, orthopnea and leg swelling.  Gastrointestinal: Negative for nausea.  Neurological: Negative for dizziness.    The problem list and medications were reviewed and updated by myself where necessary and exist elsewhere in the encounter.   OBJECTIVE:  BP 137/74   Pulse 72   Temp 98.1 F (36.7 C)   Resp 14   Ht 5' (1.524 m)   Wt 174  lb (78.9 kg)   LMP 06/16/1997   SpO2 98%   BMI 33.98 kg/m   Physical Exam  Constitutional: She is oriented to person, place, and time. She is active.  Non-toxic appearance.  HENT:  Right Ear: Hearing, external ear and ear canal normal. Tympanic membrane is injected and erythematous. Tympanic membrane is not bulging.  Left Ear: Hearing, tympanic membrane, external ear and ear canal normal.  Nose: Nose normal. Right sinus exhibits no maxillary sinus tenderness and no frontal sinus tenderness. Left sinus exhibits no maxillary sinus tenderness and no frontal sinus tenderness.  Mouth/Throat: Uvula is midline, oropharynx is clear and moist and mucous membranes are normal. Mucous membranes are not dry. No oropharyngeal exudate, posterior oropharyngeal edema or tonsillar abscesses.  Cardiovascular: Normal rate, regular rhythm, S1 normal, S2 normal, normal heart sounds and intact distal pulses.  Exam reveals no gallop, no friction rub and no decreased pulses.   No murmur heard. Pulmonary/Chest: Effort normal and breath sounds normal. No stridor. No tachypnea. No respiratory distress. She has no wheezes. She has no rales.  Abdominal: She exhibits no distension.  Musculoskeletal: She exhibits no edema.  Lymphadenopathy:       Head (right side): No submandibular and no tonsillar adenopathy present.       Head (left side): No submandibular and no tonsillar adenopathy present.    She has no cervical adenopathy.  Neurological: She is alert and oriented to person, place, and time.  Skin: Skin is warm and dry. She is not  diaphoretic. No pallor.   ASSESSMENT AND PLAN:  Keerstin was seen today for ear pain.  Diagnoses and all orders for this visit:  Right ear pain: Appears to be an early otitis media.  Will go ahead and start amox for 10 days.  She will come back if she is not improving.  -     amoxicillin (AMOXIL) 875 MG tablet; Take 1 tablet (875 mg total) by mouth 2 (two) times daily.  Cough: Likely  viral.  Normal exam.  -     benzonatate (TESSALON) 200 MG capsule; Take 1 capsule (200 mg total) by mouth 2 (two) times daily as needed for cough.    The patient is advised to call or return to clinic if she does not see an improvement in symptoms, or to seek the care of the closest emergency department if she worsens with the above plan.   Philis Fendt, MHS, PA-C [facility] 09/15/2016 2:48 PM

## 2016-09-15 NOTE — Patient Instructions (Signed)
     IF you received an x-ray today, you will receive an invoice from [facility]. Please contact [facility] at [phone] with questions or concerns regarding your invoice.   IF you received labwork today, you will receive an invoice from [facility]. Please contact [facility] at [phone] with questions or concerns regarding your invoice.   Our billing staff will not be able to assist you with questions regarding bills from these companies.  You will be contacted with the lab results as soon as they are available. The fastest way to get your results is to activate your My Chart account. Instructions are located on the last page of this paperwork. If you have not heard from us regarding the results in 2 weeks, please contact this office.     

## 2016-10-02 ENCOUNTER — Ambulatory Visit (INDEPENDENT_AMBULATORY_CARE_PROVIDER_SITE_OTHER): Payer: Medicare HMO

## 2016-10-02 ENCOUNTER — Ambulatory Visit: Payer: Medicare HMO

## 2016-10-02 ENCOUNTER — Ambulatory Visit (INDEPENDENT_AMBULATORY_CARE_PROVIDER_SITE_OTHER): Payer: Medicare HMO | Admitting: Physician Assistant

## 2016-10-02 VITALS — BP 163/81 | HR 70 | Temp 98.0°F | Resp 16 | Ht 60.0 in | Wt 174.4 lb

## 2016-10-02 DIAGNOSIS — R058 Other specified cough: Secondary | ICD-10-CM

## 2016-10-02 DIAGNOSIS — R059 Cough, unspecified: Secondary | ICD-10-CM

## 2016-10-02 DIAGNOSIS — R05 Cough: Secondary | ICD-10-CM | POA: Diagnosis not present

## 2016-10-02 DIAGNOSIS — J3489 Other specified disorders of nose and nasal sinuses: Secondary | ICD-10-CM

## 2016-10-02 MED ORDER — PREDNISONE 10 MG PO TABS: ORAL_TABLET | ORAL | 0 refills | Status: DC

## 2016-10-02 MED ORDER — FLUTICASONE PROPIONATE 50 MCG/ACT NA SUSP: 2.0000 | Freq: Every day | NASAL | 6 refills | Status: DC

## 2016-10-02 NOTE — Progress Notes (Signed)
Alicia Parker  MRN: [id number] DOB: June 29, 1945  PCP: Kathlen Brunswick, PA-C  Subjective:  Pt is a 71 year old female PMH HLD, GERD, obesity, bowens disease, who presents to clinic for ear pain, sore throat and cough.  She was here 09/15/2016 for the same - rx amoxicillin (AMOXIL) 875 MG tablet; Take 1 tablet (875 mg total) by mouth 2 (two) times daily. She feels 75% better. c/o right ear pain and sore throat, cough.  She is not sleeping well due to cough. Cough is worse when she lays down.  Endorses some runny nose and decreased hearing.  Denies wheezing, chest pain, palpitations, fever.  Tessalon did not help.   Review of Systems  Constitutional: Positive for chills. Negative for diaphoresis, fatigue and fever.  HENT: Positive for congestion, ear pain, postnasal drip, rhinorrhea and sore throat. Negative for sinus pain and sinus pressure.   Respiratory: Positive for cough. Negative for chest tightness, shortness of breath and wheezing.   Cardiovascular: Negative for chest pain and palpitations.  Psychiatric/Behavioral: Positive for sleep disturbance.    Patient Active Problem List   Diagnosis Date Noted  . Elevated LDL cholesterol level 10/18/2011  . Lumbar spondylosis 10/18/2011  . Obesity (BMI 30-39.9) 10/18/2011  . Abnormal finding on Pap smear, HPV DNA positive 10/18/2011  . Hx of gastroesophageal reflux (GERD) 10/18/2011  . Bowens disease 10/18/2011  . History of colon polyps 10/18/2011    Current Outpatient Prescriptions on File Prior to Visit  Medication Sig Dispense Refill  . amoxicillin (AMOXIL) 875 MG tablet Take 1 tablet (875 mg total) by mouth 2 (two) times daily. (Patient not taking: Reported on 10/02/2016) 20 tablet 0  . benzonatate (TESSALON) 200 MG capsule Take 1 capsule (200 mg total) by mouth 2 (two) times daily as needed for cough. (Patient not taking: Reported on 10/02/2016) 20 capsule 0  . esomeprazole (NEXIUM) 10 MG packet Take 10 mg by mouth daily before  breakfast. Reported on 10/22/2015    . mupirocin ointment (BACTROBAN) 2 % Place 1 application into the nose 2 (two) times daily. (Patient not taking: Reported on 09/15/2016) 22 g 0  . omega-3 acid ethyl esters (LOVAZA) 1 G capsule Take by mouth 2 (two) times daily. Reported on 10/22/2015     No current facility-administered medications on file prior to visit.     No Known Allergies   Objective:  BP (!) 163/81   Pulse 70   Temp 98 F (36.7 C) (Oral)   Resp 16   Ht 5' (1.524 m)   Wt 174 lb 6.4 oz (79.1 kg)   LMP 06/16/1997   SpO2 98%   BMI 34.06 kg/m   Physical Exam  Constitutional: She is oriented to person, place, and time and well-developed, well-nourished, and in no distress. No distress.  HENT:  Nose: Mucosal edema present. No rhinorrhea. Right sinus exhibits no maxillary sinus tenderness and no frontal sinus tenderness. Left sinus exhibits no maxillary sinus tenderness and no frontal sinus tenderness.  Mouth/Throat: Mucous membranes are normal. Posterior oropharyngeal edema present. No oropharyngeal exudate or posterior oropharyngeal erythema.  Cardiovascular: Normal rate, regular rhythm and normal heart sounds.   Pulmonary/Chest: Effort normal and breath sounds normal. No respiratory distress. She has no wheezes. She has no rales.  Neurological: She is alert and oriented to person, place, and time. GCS score is 15.  Skin: Skin is warm and dry.  Psychiatric: Mood, memory, affect and judgment normal.  Vitals reviewed.  Dg Chest 2 View  Result Date: 10/02/2016 CLINICAL DATA:  Cough for 3 weeks EXAM: CHEST  2 VIEW COMPARISON:  04/25/2015 FINDINGS: There is no focal parenchymal opacity. There is no pleural effusion or pneumothorax. There is stable cardiomegaly. The osseous structures are unremarkable. IMPRESSION: No active cardiopulmonary disease. Electronically Signed   By: Kathreen Devoid   On: 10/02/2016 15:47    Assessment and Plan :  1. Upper airway cough syndrome 2. Cough 3.  Rhinorrhea - predniSONE (DELTASONE) 10 MG tablet; Take 3 PO QAM x2days, 2 PO QAM x2days, 1 PO QAM x1days  Dispense: 11 tablet; Refill: 0 - DG Chest 2 View; Future - fluticasone (FLONASE) 50 MCG/ACT nasal spray; Place 2 sprays into both nostrils daily.  Dispense: 16 g; Refill: 6 - Chest x-ray is negative. She is improving since her course of antibiotics she received at her last visit, however does not feel like her symptoms has completely resolved. Suspect upper airway cough syndrome. Will treat with prednisone and flonase. Encouraged hydration. RTC in 5-7 days if no improvement.      Mercer Pod, PA-C  [facility] 10/02/2016 2:32 PM

## 2016-10-02 NOTE — Patient Instructions (Addendum)
Flonase - 2 sprays each nostril before bed. You may repeat this during the day if you experience relief.  Stay well hydrated - drink 1-2 liters of water daily. Warm tea with honey for sore throat.  Come back in 5-7 days if you are not better.    Thank you for coming in today. I hope you feel we met your needs.  Feel free to call [facility] if you have any questions or further requests.  Please consider signing up for MyChart if you do not already have it, as this is a great way to communicate with me.  Best,  Whitney McVey, PA-C  IF you received an x-ray today, you will receive an invoice from [facility]. Please contact [facility] at [phone] with questions or concerns regarding your invoice.   IF you received labwork today, you will receive an invoice from [facility]. Please contact [facility] at [phone] with questions or concerns regarding your invoice.   Our billing staff will not be able to assist you with questions regarding bills from these companies.  You will be contacted with the lab results as soon as they are available. The fastest way to get your results is to activate your My Chart account. Instructions are located on the last page of this paperwork. If you have not heard from [place] regarding the results in 2 weeks, please contact this office.

## 2016-11-24 ENCOUNTER — Ambulatory Visit (INDEPENDENT_AMBULATORY_CARE_PROVIDER_SITE_OTHER): Payer: Medicare HMO | Admitting: Physician Assistant

## 2016-11-24 VITALS — BP 127/75 | HR 65 | Temp 97.7°F | Resp 18 | Ht 60.0 in | Wt 170.0 lb

## 2016-11-24 DIAGNOSIS — Z13220 Encounter for screening for lipoid disorders: Secondary | ICD-10-CM

## 2016-11-24 DIAGNOSIS — Z131 Encounter for screening for diabetes mellitus: Secondary | ICD-10-CM

## 2016-11-24 DIAGNOSIS — Z23 Encounter for immunization: Secondary | ICD-10-CM | POA: Diagnosis not present

## 2016-11-24 DIAGNOSIS — Z0000 Encounter for general adult medical examination without abnormal findings: Secondary | ICD-10-CM

## 2016-11-24 DIAGNOSIS — Z1159 Encounter for screening for other viral diseases: Secondary | ICD-10-CM

## 2016-11-24 DIAGNOSIS — Z1239 Encounter for other screening for malignant neoplasm of breast: Secondary | ICD-10-CM

## 2016-11-24 MED ORDER — ZOSTER VAC RECOMB ADJUVANTED 50 MCG/0.5ML IM SUSR: 0.5000 mL | Freq: Once | INTRAMUSCULAR | 1 refills | Status: AC

## 2016-11-24 NOTE — Progress Notes (Deleted)
11/24/2016 10:02 AM   DOB: 12-04-1945 / MRN: [id number]  SUBJECTIVE:  Alicia Parker is a 71 y.o. female presenting for   She has No Known Allergies.   She  has a past medical history of Cataract; GERD (gastroesophageal reflux disease); Hyperlipidemia; and Sleep apnea.    She  reports that she has been smoking Cigarettes.  She has a 5.00 pack-year smoking history. She has never used smokeless tobacco. She reports that she drinks about 3.6 oz of alcohol per week . She reports that she does not use drugs. She  reports that she does not engage in sexual activity. The patient  has a past surgical history that includes Appendectomy (1968); Cholecystectomy (1968); Reduction mammaplasty (Bilateral); Tubal ligation (1968); Toe Surgery; Colonoscopy (2004); and Upper gastrointestinal endoscopy.  Her family history includes Cancer (age of onset: 54) in her father; Colon polyps in her sister; Early death in her brother and brother; Hypertension in her father, maternal grandmother, and mother; Stroke in her mother.  Review of Systems  Constitutional: Negative for chills, diaphoresis and fever.  Eyes: Negative.   Respiratory: Negative for cough, hemoptysis, sputum production, shortness of breath and wheezing.   Cardiovascular: Negative for chest pain, orthopnea and leg swelling.  Gastrointestinal: Negative for abdominal pain and nausea.  Genitourinary: Negative for dysuria, flank pain, frequency, hematuria and urgency.  Skin: Negative for rash.  Neurological: Negative for dizziness, sensory change, speech change, focal weakness and headaches.    The problem list and medications were reviewed and updated by myself where necessary and exist elsewhere in the encounter.   OBJECTIVE:  BP 127/75   Pulse 65   Temp 97.7 F (36.5 C) (Oral)   Resp 18   Ht 5' (1.524 m)   Wt 170 lb (77.1 kg)   LMP 06/16/1997   SpO2 96%   BMI 33.20 kg/m   Physical Exam  Constitutional: She is oriented to person,  place, and time. She is active.  Non-toxic appearance.  Cardiovascular: Normal rate, regular rhythm, S1 normal, S2 normal, normal heart sounds and intact distal pulses.  Exam reveals no gallop, no friction rub and no decreased pulses.   No murmur heard. Pulmonary/Chest: Effort normal. No tachypnea. She has no rales.  Abdominal: She exhibits no distension.  Musculoskeletal: She exhibits no edema.  Neurological: She is alert and oriented to person, place, and time.  Skin: Skin is warm and dry. She is not diaphoretic. No pallor.    No results found for this or any previous visit (from the past 72 hour(s)).  No results found.  ASSESSMENT AND PLAN:  [name] was seen today for annual exam.  Diagnoses and all orders for this visit:  Medicare annual wellness visit, subsequent  Screening for lipid disorders -     Lipid panel  Screening for diabetes mellitus -     Hemoglobin A1c  Encounter for hepatitis C screening test for low risk patient -     Acute Hep Panel & Hep B Surface Ab  Need for shingles vaccine -     Zoster Vac Recomb Adjuvanted (SHINGRIX) injection; Inject 0.5 mLs into the muscle once. Booster (refill) due 6 months after initial injection.  Need for vaccination against Streptococcus pneumoniae -     Pneumococcal conjugate vaccine 13-valent IM    The patient is advised to call or return to clinic if she does not see an improvement in symptoms, or to seek the care of the closest emergency department if she worsens  with the above plan.   Philis Fendt, MHS, PA-C [facility] 11/24/2016 10:02 AM

## 2016-11-24 NOTE — Addendum Note (Signed)
Addended by: Tereasa Coop on: 11/24/2016 10:32 AM   Modules accepted: Miquel Dunn

## 2016-11-24 NOTE — Patient Instructions (Addendum)
Exercise improves every system in the body.  It lowers the risk of heart disease, decreases blood pressure, reduces the symptoms of depression and anxiety, and lowers blood sugar. To receive these benefits, try to get 150 minutes of planned exercise each week.  You can break this 150 minutes up however you like.  For instance, you can perform 30 minutes of brisk walking 5 days a week, or perform 50 minutes 3 days a week.  If you don't like walking, or can't find a safe place to walk, find another way to move that you can enjoy.  Exercise tapes, cycling, stair climbing, swimming, or a combination will be just as good as a walking program. To ensure the proper intensity, you can use the talk test. Essentially, you should be able to carry on a conversation, but you should have to take short breaks from the conversation in order catch your breath.     IF you received an x-ray today, you will receive an invoice from [facility]. Please contact [facility] at [phone] with questions or concerns regarding your invoice.   IF you received labwork today, you will receive an invoice from [facility]. Please contact [facility] at [phone] with questions or concerns regarding your invoice.   Our billing staff will not be able to assist you with questions regarding bills from these companies.  You will be contacted with the lab results as soon as they are available. The fastest way to get your results is to activate your My Chart account. Instructions are located on the last page of this paperwork. If you have not heard from us regarding the results in 2 weeks, please contact this office.     

## 2016-11-24 NOTE — Progress Notes (Signed)
Presents today for Medicare Visit.   Interpreter used for this visit? no  Other items to address today: none   Cancer Screening: Cervical (every2 years - ages 21-65): yes, negative in 2015.  Patient has aged out.  Breast (annually - ages 69-75): yes, negative in 2017.  Colon (every 10 years - ages 41-75): yes, negative in 2017 per path.    Other screening: Bone density screening (every 2 years - ages >81): Yes, normal in 2017 ETOH use: She does not drink every day however with often drink 4-5 beers in one sitting.  Dental visits: Last went 2 years ago.  Will make an appointment.   Vision visits: Sees optometry 1 times yearly. Back in January.  This is dialated eye exam. She wears glasses.  Typical Meals: She eats lots of fruits and vegetables. Not a lot of sweets.  Typical Beverages: Soda every now and then.  Some orange juices. Mostly water.  Exercise: She does not exercise formally.  She does chase her grandkids around.   Lab Screening: Last screening for diabetes (annually): None exist.  Last lipid screening (every 5 years): 2013.    ADVANCE DIRECTIVES: Discussed: yes On File: no Materials Provided: yes  Immunization History  Administered Date(s) Administered  . Influenza Split 06/19/2010  . Pneumococcal Conjugate-13 11/24/2016  . Tdap 10/26/2009     Depression screen [facility] 2/9 11/24/2016 11/24/2016 10/02/2016 09/15/2016 12/03/2015  Decreased Interest 0 0 0 0 0  Down, Depressed, Hopeless 0 0 0 0 0  PHQ - 2 Score 0 0 0 0 0    Fall Risk  11/24/2016 11/24/2016 10/02/2016 09/15/2016 12/03/2015  Falls in the past year? No No No No No    Functional Status Survey: Is the patient deaf or have difficulty hearing?: No Does the patient have difficulty seeing, even when wearing glasses/contacts?: No (no problems but was told she has cataracts) Does the patient have difficulty concentrating, remembering, or making decisions?: No Does the patient have difficulty walking or  climbing stairs?: No Does the patient have difficulty dressing or bathing?: No Does the patient have difficulty doing errands alone such as visiting a doctor's office or shopping?: No   Immunization status:  Immunization History  Administered Date(s) Administered  . Influenza Split 06/19/2010  . Pneumococcal Conjugate-13 11/24/2016  . Tdap 10/26/2009    Health Maintenance Due  Topic Date Due  . Hepatitis C Screening  Jul 13, 1945  . PNA vac Low Risk Adult (1 of 2 - PCV13) 03/16/2012    Patient Care Team: Hillis Range as PCP - General (Urgent Care)   Patient Active Problem List   Diagnosis Date Noted  . Elevated LDL cholesterol level 10/18/2011  . Lumbar spondylosis 10/18/2011  . Obesity (BMI 30-39.9) 10/18/2011  . Abnormal finding on Pap smear, HPV DNA positive 10/18/2011  . Hx of gastroesophageal reflux (GERD) 10/18/2011  . Bowens disease 10/18/2011  . History of colon polyps 10/18/2011     Past Medical History:  Diagnosis Date  . Cataract   . GERD (gastroesophageal reflux disease)   . Hyperlipidemia    no current medications  . Sleep apnea    doesnt wear cpap      Past Surgical History:  Procedure Laterality Date  . APPENDECTOMY  1968  . CHOLECYSTECTOMY  1968  . COLONOSCOPY  2004   polyps  . REDUCTION MAMMAPLASTY Bilateral   . TOE SURGERY    . TUBAL LIGATION  1968  . UPPER GASTROINTESTINAL ENDOSCOPY  Family History  Problem Relation Age of Onset  . Hypertension Mother   . Stroke Mother   . Hypertension Father   . Cancer Father 42       Leukemia  . Early death Brother        pneumonia  . Hypertension Maternal Grandmother   . Early death Brother   . Colon polyps Sister   . Colon cancer Neg Hx   . Esophageal cancer Neg Hx   . Rectal cancer Neg Hx   . Stomach cancer Neg Hx      Social History   Social History  . Marital status: Married    Spouse name: N/A  . Number of children: N/A  . Years of education: N/A    Occupational History  . Not on file.   Social History Main Topics  . Smoking status: Current Every Day Smoker    Packs/day: 0.50    Years: 10.00    Types: Cigarettes  . Smokeless tobacco: Never Used  . Alcohol use 3.6 oz/week    6 Cans of beer per week     Comment: daily-   . Drug use: No  . Sexual activity: No     Comment: btl   Other Topics Concern  . Not on file   Social History Narrative  . No narrative on file     No Known Allergies  Prior to Admission medications   Not on File      ELECTROCARDIOGRAM (once at welcome to medicare) NSR, normal axis, -hypertrophy, ischemia and infarction in 2016.    PHYSICAL EXAM: BP 127/75   Pulse 65   Temp 97.7 F (36.5 C) (Oral)   Resp 18   Ht 5' (1.524 m)   Wt 170 lb (77.1 kg)   LMP 06/16/1997   SpO2 96%   BMI 33.20 kg/m   Wt Readings from Last 3 Encounters:  11/24/16 170 lb (77.1 kg)  10/02/16 174 lb 6.4 oz (79.1 kg)  09/15/16 174 lb (78.9 kg)      Visual Acuity Screening   Right eye Left eye Both eyes  Without correction:     With correction: 20/25-1 20/25-1 20/20    Physical Exam  Constitutional: She is oriented to person, place, and time. She appears well-developed and well-nourished. No distress.  HENT:  Right Ear: Hearing, tympanic membrane, external ear and ear canal normal.  Left Ear: Hearing, tympanic membrane, external ear and ear canal normal.  Nose: Mucosal edema present. Right sinus exhibits no maxillary sinus tenderness and no frontal sinus tenderness. Left sinus exhibits no maxillary sinus tenderness and no frontal sinus tenderness.  Mouth/Throat: Uvula is midline, oropharynx is clear and moist and mucous membranes are normal.  Cardiovascular: Normal rate, regular rhythm and normal heart sounds.   Respiratory: Effort normal and breath sounds normal.  Neurological: She is alert and oriented to person, place, and time.  Skin: Skin is warm and dry. She is not diaphoretic.  Psychiatric: She  has a normal mood and affect.     Education/Counseling: yes diet and exercise yes prevention of chronic diseases yes smoking/tobacco cessation yes review "Covered Medicare Preventive Services"    ASSESSMENT/PLAN:  Shealee was seen today for annual exam.  Diagnoses and all orders for this visit:  Medicare annual wellness visit, subsequent: Advanced directives discussed today and she will complete the documentation provided and have a notary sign and return here for scan.  Aside from mammography, lipids, diabetes screenings she is up to date.  -  Care order/instruction:  Screening for lipid disorders -     Lipid panel  Screening for diabetes mellitus -     Hemoglobin A1c  Encounter for hepatitis C screening test for low risk patient -     Acute Hep Panel & Hep B Surface Ab  Need for shingles vaccine -     Zoster Vac Recomb Adjuvanted (SHINGRIX) injection; Inject 0.5 mLs into the muscle once. Booster (refill) due 6 months after initial injection.  Need for vaccination against Streptococcus pneumoniae -     Pneumococcal conjugate vaccine 13-valent IM  Screening for breast cancer -     MM DIGITAL SCREENING BILATERAL; Future

## 2016-11-25 ENCOUNTER — Other Ambulatory Visit: Payer: Self-pay | Admitting: Physician Assistant

## 2016-11-25 ENCOUNTER — Telehealth: Payer: Self-pay | Admitting: Physician Assistant

## 2016-11-25 DIAGNOSIS — L509 Urticaria, unspecified: Secondary | ICD-10-CM

## 2016-11-25 LAB — HEMOGLOBIN A1C
Est. average glucose Bld gHb Est-mCnc: 111 mg/dL
Hgb A1c MFr Bld: 5.5 % (ref 4.8–5.6)

## 2016-11-25 LAB — LIPID PANEL
CHOL/HDL RATIO: 2.6 ratio (ref 0.0–4.4)
CHOLESTEROL TOTAL: 161 mg/dL (ref 100–199)
HDL: 61 mg/dL (ref >39)
LDL Calculated: 84 mg/dL (ref 0–99)
Triglycerides: 78 mg/dL (ref 0–149)
VLDL CHOLESTEROL CAL: 16 mg/dL (ref 5–40)

## 2016-11-25 LAB — ACUTE HEP PANEL AND HEP B SURFACE AB
HEP A IGM: NEGATIVE
HEP B C IGM: NEGATIVE
Hepatitis B Surf Ab Quant: <3.1 m[IU]/mL — ABNORMAL LOW (ref >9.9)
Hepatitis B Surface Ag: NEGATIVE

## 2016-11-25 NOTE — Telephone Encounter (Signed)
If she has any lip, throat or tongue swelling, dizziness or SOB then I need to see her asap. I am placing referral to allergist now. Philis Fendt, MS, PA-C 3:27 PM, 11/25/2016

## 2016-11-25 NOTE — Telephone Encounter (Signed)
Pt's arm broke out in hives on Saturday and also had a shot yesterday for Pneumonia and said the break out got worse. Pt wants to be referred to an allergist because she thinks there is something she is allergic to. She said she ate fish on Saturday so it could be that but the break out got worse after the shot. Pt says she has been breaking out in hives recently. Pt took benadryl yesterday and said it got a little better but it still there. Please advise.

## 2016-11-25 NOTE — Progress Notes (Signed)
Patient complaining of hives s/p pneumococcal shot, also complains of chronic hives that have been made worse with certain foods.  She will come in if any lip, throat, tongue swelling, dizziness, SOB.  Referral to allergy in at this time.

## 2017-02-12 ENCOUNTER — Telehealth: Payer: Self-pay

## 2017-02-12 ENCOUNTER — Ambulatory Visit: Payer: Medicare HMO | Admitting: Physician Assistant

## 2017-02-12 NOTE — Telephone Encounter (Signed)
Patient walked in to office after appointment hours c/o chest pain radiating down arms. Per PA, patient advised to be evaluated by ER for possible cardiac issue. Patient requesting appointment for the next day, again advised to be evaluated by ER./ S.Syerra Abdelrahman,CMA

## 2017-02-13 ENCOUNTER — Ambulatory Visit (INDEPENDENT_AMBULATORY_CARE_PROVIDER_SITE_OTHER): Payer: Medicare HMO | Admitting: Physician Assistant

## 2017-02-13 ENCOUNTER — Encounter: Payer: Self-pay | Admitting: Physician Assistant

## 2017-02-13 VITALS — BP 129/79 | HR 71 | Temp 98.1°F | Resp 16 | Ht 60.0 in | Wt 170.0 lb

## 2017-02-13 DIAGNOSIS — R0789 Other chest pain: Secondary | ICD-10-CM

## 2017-02-13 DIAGNOSIS — K219 Gastro-esophageal reflux disease without esophagitis: Secondary | ICD-10-CM

## 2017-02-13 DIAGNOSIS — Z23 Encounter for immunization: Secondary | ICD-10-CM | POA: Diagnosis not present

## 2017-02-13 MED ORDER — OMEPRAZOLE 40 MG PO CPDR: 40.0000 mg | DELAYED_RELEASE_CAPSULE | Freq: Every day | ORAL | 1 refills | Status: DC

## 2017-02-13 NOTE — Patient Instructions (Addendum)
Come back if your symptoms worsen. Thank you for coming in today. I hope you feel we met your needs.  Feel free to call PCP if you have any questions or further requests.  Please consider signing up for MyChart if you do not already have it, as this is a great way to communicate with me.  Best,  Whitney McVey, PA-C   Food Choices for Gastroesophageal Reflux Disease, Adult When you have gastroesophageal reflux disease (GERD), the foods you eat and your eating habits are very important. Choosing the right foods can help ease the discomfort of GERD. Consider working with a diet and nutrition specialist (dietitian) to help you make healthy food choices. What general guidelines should I follow? Eating plan  Choose healthy foods low in fat, such as fruits, vegetables, whole grains, low-fat dairy products, and lean meat, fish, and poultry.  Eat frequent, small meals instead of three large meals each day. Eat your meals slowly, in a relaxed setting. Avoid bending over or lying down until 2-3 hours after eating.  Limit high-fat foods such as fatty meats or fried foods.  Limit your intake of oils, butter, and shortening to less than 8 teaspoons each day.  Avoid the following: ? Foods that cause symptoms. These may be different for different people. Keep a food diary to keep track of foods that cause symptoms. ? Alcohol. ? Drinking large amounts of liquid with meals. ? Eating meals during the 2-3 hours before bed.  Cook foods using methods other than frying. This may include baking, grilling, or broiling. Lifestyle   Maintain a healthy weight. Ask your health care provider what weight is healthy for you. If you need to lose weight, work with your health care provider to do so safely.  Exercise for at least 30 minutes on 5 or more days each week, or as told by your health care provider.  Avoid wearing clothes that fit tightly around your waist and chest.  Do not use any products that  contain nicotine or tobacco, such as cigarettes and e-cigarettes. If you need help quitting, ask your health care provider.  Sleep with the head of your bed raised. Use a wedge under the mattress or blocks under the bed frame to raise the head of the bed. What foods are not recommended? The items listed may not be a complete list. Talk with your dietitian about what dietary choices are best for you. Grains Pastries or quick breads with added fat. [place] toast. Vegetables Deep fried vegetables. [place] fries. Any vegetables prepared with added fat. Any vegetables that cause symptoms. For some people this may include tomatoes and tomato products, chili peppers, onions and garlic, and horseradish. Fruits Any fruits prepared with added fat. Any fruits that cause symptoms. For some people this may include citrus fruits, such as oranges, grapefruit, pineapple, and lemons. Meats and other protein foods High-fat meats, such as fatty beef or pork, hot dogs, ribs, ham, sausage, salami and bacon. Fried meat or protein, including fried fish and fried chicken. Nuts and nut butters. Dairy Whole milk and chocolate milk. Sour cream. Cream. Ice cream. Cream cheese. Milk shakes. Beverages Coffee and tea, with or without caffeine. Carbonated beverages. Sodas. Energy drinks. Fruit juice made with acidic fruits (such as orange or grapefruit). Tomato juice. Alcoholic drinks. Fats and oils Butter. Margarine. Shortening. Ghee. Sweets and desserts Chocolate and cocoa. Donuts. Seasoning and other foods Pepper. Peppermint and spearmint. Any condiments, herbs, or seasonings that cause symptoms. For some people, this  may include curry, hot sauce, or vinegar-based salad dressings. Summary  When you have gastroesophageal reflux disease (GERD), food and lifestyle choices are very important to help ease the discomfort of GERD.  Eat frequent, small meals instead of three large meals each day. Eat your meals slowly, in a  relaxed setting. Avoid bending over or lying down until 2-3 hours after eating.  Limit high-fat foods such as fatty meat or fried foods. This information is not intended to replace advice given to you by your health care provider. Make sure you discuss any questions you have with your health care provider. Document Released: 06/02/2005 Document Revised: 06/03/2016 Document Reviewed: 06/03/2016 Elsevier Interactive Patient Education  2017 [employer].   IF you received an x-ray today, you will receive an invoice from [facility]. Please contact [facility] at [phone] with questions or concerns regarding your invoice.   IF you received labwork today, you will receive an invoice from [facility]. Please contact [facility] at [phone] with questions or concerns regarding your invoice.   Our billing staff will not be able to assist you with questions regarding bills from these companies.  You will be contacted with the lab results as soon as they are available. The fastest way to get your results is to activate your My Chart account. Instructions are located on the last page of this paperwork. If you have not heard from [place] regarding the results in 2 weeks, please contact this office.

## 2017-02-13 NOTE — Progress Notes (Signed)
   Alicia Parker  MRN: [id number] DOB: 02-Oct-1945  PCP: Tereasa Coop, PA-C  Subjective:  Pt is a 71 year old female who presents to clinic for chest pain.  She woke up two nights ago around 3 am with pain in upper chest and back. She ate a cupcake at 1 am while she was laying down watching TV. Last night she had a similar experience after she drank a beer past 9pm - she belched and felt better.  She usually tries not to eat past 8 pm.  She used to take OTC Nexium for reflux. Last dose about 1 year ago.  Denies chest pressure, neck pain, arm pain, headache, diaphoresis, feeling nervous or anxious.   Review of Systems  Constitutional: Negative for diaphoresis.  Respiratory: Negative for cough, chest tightness, shortness of breath and wheezing.   Cardiovascular: Positive for chest pain. Negative for palpitations.  Gastrointestinal: Negative for constipation, diarrhea, nausea and vomiting.  Musculoskeletal: Negative for neck pain.  Neurological: Negative for dizziness, weakness, light-headedness and headaches.  Psychiatric/Behavioral: Positive for sleep disturbance. The patient is not nervous/anxious.     Patient Active Problem List   Diagnosis Date Noted  . Elevated LDL cholesterol level 10/18/2011  . Lumbar spondylosis 10/18/2011  . Obesity (BMI 30-39.9) 10/18/2011  . Hx of gastroesophageal reflux (GERD) 10/18/2011  . Bowens disease 10/18/2011  . History of colon polyps 10/18/2011    No current outpatient prescriptions on file prior to visit.   No current facility-administered medications on file prior to visit.     No Known Allergies   Objective:  BP 129/79   Pulse 71   Temp 98.1 F (36.7 C) (Oral)   Resp 16   Ht 5' (1.524 m)   Wt 170 lb (77.1 kg)   LMP 06/16/1997   SpO2 97%   BMI 33.20 kg/m   Physical Exam  Constitutional: She is oriented to person, place, and time and well-developed, well-nourished, and in no distress. No distress.  Cardiovascular: Normal  rate, regular rhythm, S1 normal, S2 normal, normal heart sounds and normal pulses.   Abdominal: Soft. Normal appearance. There is no tenderness.  Neurological: She is alert and oriented to person, place, and time. GCS score is 15.  Skin: Skin is warm and dry.  Psychiatric: Mood, memory, affect and judgment normal.  Vitals reviewed.  EKG: normal sinus rhythm. No ST abnormalities.  Assessment and Plan :  1. Chest discomfort 2. Gastroesophageal reflux disease without esophagitis - EKG 12-Lead - omeprazole (PRILOSEC) 40 MG capsule; Take 1 capsule (40 mg total) by mouth daily.  Dispense: 30 capsule; Refill: 1 - Suspect reflux causing pt's chest pain, as this seems to be food related. Advised pt not to eat past 8 pm. RTC in 4 weeks for recheck, or sooner if symptoms worsen. Emergency department precautions discussed.   3. Need for influenza vaccination - Flu Vaccine QUAD 6+ mos PF IM (Fluarix Quad PF)   Mercer Pod, PA-C  [facility] 02/13/2017 8:48 AM

## 2017-02-16 ENCOUNTER — Emergency Department (HOSPITAL_COMMUNITY)
Admission: EM | Admit: 2017-02-16 | Discharge: 2017-02-16 | Disposition: A | Discharge status: Home / Self Care | Payer: Medicare HMO | Attending: Emergency Medicine | Admitting: Emergency Medicine

## 2017-02-16 ENCOUNTER — Encounter (HOSPITAL_COMMUNITY): Payer: Self-pay | Admitting: Emergency Medicine

## 2017-02-16 ENCOUNTER — Emergency Department (HOSPITAL_COMMUNITY): Payer: Medicare HMO

## 2017-02-16 DIAGNOSIS — F17210 Nicotine dependence, cigarettes, uncomplicated: Secondary | ICD-10-CM | POA: Diagnosis not present

## 2017-02-16 DIAGNOSIS — M79605 Pain in left leg: Secondary | ICD-10-CM | POA: Diagnosis not present

## 2017-02-16 MED ORDER — IBUPROFEN 200 MG PO TABS
600.0000 mg | ORAL_TABLET | Freq: Once | ORAL | Status: AC
  Administered 2017-02-16: 600 mg via ORAL
  Filled 2017-02-16: qty 3

## 2017-02-16 MED ORDER — DOXYCYCLINE HYCLATE 100 MG PO CAPS: 100.0000 mg | ORAL_CAPSULE | Freq: Two times a day (BID) | ORAL | 0 refills | Status: DC

## 2017-02-16 MED ORDER — CEPHALEXIN 500 MG PO CAPS: 500.0000 mg | ORAL_CAPSULE | Freq: Four times a day (QID) | ORAL | 0 refills | Status: DC

## 2017-02-16 NOTE — ED Notes (Signed)
Discharge instructions reviewed with patient. Patient verbalizes understanding. VSS.   

## 2017-02-16 NOTE — Discharge Instructions (Signed)
Start taking the antibiotics if you develop a fever, increased redness to the area, or more pain. Use ibuprofen as directed

## 2017-02-16 NOTE — ED Provider Notes (Signed)
[facility] DEPT Provider Note   CSN: [id number] Arrival date & time: 02/16/17  8546     History   Chief Complaint Chief Complaint  Patient presents with  . Leg Pain    HPI Alicia Parker is a 71 y.o. female.  71 year old female presenting with acute onset of atraumatic left distal tibia pain 1 day. She knows the pain today when she woke up and began to walking characterizes it as sharp and worse with standing and better with rest. Denies any known history of trauma. No pain to her calf or thigh. Denies any ankle discomfort. Does not take any blood thinners. No fever or chills. No treatment use prior to arrival. No prior history of same.      Past Medical History:  Diagnosis Date  . Cataract   . GERD (gastroesophageal reflux disease)   . Hyperlipidemia    no current medications  . Sleep apnea    doesnt wear cpap     Patient Active Problem List   Diagnosis Date Noted  . Elevated LDL cholesterol level 10/18/2011  . Lumbar spondylosis 10/18/2011  . Obesity (BMI 30-39.9) 10/18/2011  . Hx of gastroesophageal reflux (GERD) 10/18/2011  . Bowens disease 10/18/2011  . History of colon polyps 10/18/2011    Past Surgical History:  Procedure Laterality Date  . APPENDECTOMY  1968  . CHOLECYSTECTOMY  1968  . COLONOSCOPY  2004   polyps  . REDUCTION MAMMAPLASTY Bilateral   . TOE SURGERY    . TUBAL LIGATION  1968  . UPPER GASTROINTESTINAL ENDOSCOPY      OB History    Gravida Para Term Preterm AB Living   3 3 3     3    SAB TAB Ectopic Multiple Live Births           3       Home Medications    Prior to Admission medications   Medication Sig Start Date End Date Taking? Authorizing Provider  omeprazole (PRILOSEC) 40 MG capsule Take 1 capsule (40 mg total) by mouth daily. 02/13/17  Yes McVey, Gelene Mink, PA-C  mometasone (ELOCON) 0.1 % cream Apply 1 application topically daily as needed. 02/13/17   [provider]    Family History Family History    Problem Relation Age of Onset  . Hypertension Mother   . Stroke Mother   . Hypertension Father   . Cancer Father 67       Leukemia  . Early death Brother        pneumonia  . Hypertension Maternal Grandmother   . Early death Brother   . Colon polyps Sister   . Colon cancer Neg Hx   . Esophageal cancer Neg Hx   . Rectal cancer Neg Hx   . Stomach cancer Neg Hx     Social History Social History  Substance Use Topics  . Smoking status: Current Every Day Smoker    Packs/day: 0.50    Years: 10.00    Types: Cigarettes  . Smokeless tobacco: Never Used  . Alcohol use 3.6 oz/week    6 Cans of beer per week     Comment: daily-      Allergies   Patient has no known allergies.   Review of Systems Review of Systems  All other systems reviewed and are negative.    Physical Exam Updated Vital Signs BP 131/70   Pulse 76   Temp 98.3 F (36.8 C) (Oral)   Resp 18   Wt 77.1  kg (170 lb)   LMP 06/16/1997   SpO2 98%   BMI 33.20 kg/m   Physical Exam  Constitutional: She is oriented to person, place, and time. She appears well-developed and well-nourished.  Non-toxic appearance.  HENT:  Head: Normocephalic and atraumatic.  Eyes: Pupils are equal, round, and reactive to light. Conjunctivae are normal.  Neck: Normal range of motion.  Cardiovascular: Normal rate.   Pulmonary/Chest: Effort normal.  Musculoskeletal:       Feet:  Neurological: She is alert and oriented to person, place, and time.  Skin: Skin is warm and dry.  Psychiatric: She has a normal mood and affect.  Nursing note and vitals reviewed.    ED Treatments / Results  Labs (all labs ordered are listed, but only abnormal results are displayed) Labs Reviewed - No data to display  EKG  EKG Interpretation None       Radiology No results found.  Procedures Procedures (including critical care time)  Medications Ordered in ED Medications  ibuprofen (ADVIL,MOTRIN) tablet 600 mg (not administered)      Initial Impression / Assessment and Plan / ED Course  I have reviewed the triage vital signs and the nursing notes.  Pertinent labs & imaging results that were available during my care of the patient were reviewed by me and considered in my medical decision making (see chart for details).   patient given Motrin and feels better. X-ray negative. Left lower extremity slightly more erythematous. May be developing early cellulitis. Patient prescribed antibiotics and with instructions on usage  Final Clinical Impressions(s) / ED Diagnoses   Final diagnoses:  None    New Prescriptions New Prescriptions   No medications on file     Lacretia Leigh, MD 02/16/17 1144

## 2017-02-16 NOTE — ED Triage Notes (Addendum)
Pt in r/leg since yesterday evening. Stated that when she moves her l/ toes, the pain radiates up the front of her leg to mid calf. Pt states that the area is painful to touch and warm. Denies trauma

## 2017-09-24 ENCOUNTER — Ambulatory Visit (INDEPENDENT_AMBULATORY_CARE_PROVIDER_SITE_OTHER): Payer: Medicare HMO | Admitting: Physician Assistant

## 2017-09-24 ENCOUNTER — Other Ambulatory Visit: Payer: Self-pay

## 2017-09-24 ENCOUNTER — Encounter: Payer: Self-pay | Admitting: Physician Assistant

## 2017-09-24 ENCOUNTER — Ambulatory Visit: Payer: Self-pay | Admitting: *Deleted

## 2017-09-24 VITALS — BP 146/76 | Temp 98.0°F | Resp 18 | Ht 61.22 in | Wt 171.0 lb

## 2017-09-24 DIAGNOSIS — R030 Elevated blood-pressure reading, without diagnosis of hypertension: Secondary | ICD-10-CM | POA: Diagnosis not present

## 2017-09-24 DIAGNOSIS — R82998 Other abnormal findings in urine: Secondary | ICD-10-CM

## 2017-09-24 DIAGNOSIS — I10 Essential (primary) hypertension: Secondary | ICD-10-CM

## 2017-09-24 DIAGNOSIS — Z789 Other specified health status: Secondary | ICD-10-CM

## 2017-09-24 DIAGNOSIS — E2839 Other primary ovarian failure: Secondary | ICD-10-CM

## 2017-09-24 DIAGNOSIS — R42 Dizziness and giddiness: Secondary | ICD-10-CM | POA: Diagnosis not present

## 2017-09-24 LAB — POCT CBC
Granulocyte percent: 57.2 %G (ref 37–80)
HCT, POC: 43.6 % (ref 37.7–47.9)
HEMOGLOBIN: 13.9 g/dL (ref 12.2–16.2)
Lymph, poc: 1.8 (ref 0.6–3.4)
MCH: 27.5 pg (ref 27–31.2)
MCHC: 31.9 g/dL (ref 31.8–35.4)
MCV: 86.2 fL (ref 80–97)
MID (cbc): 0.2 (ref 0–0.9)
MPV: 6.8 fL (ref 0–99.8)
PLATELET COUNT, POC: 287 10*3/uL (ref 142–424)
POC Granulocyte: 2.7 (ref 2–6.9)
POC LYMPH PERCENT: 38.9 %L (ref 10–50)
POC MID %: 3.9 %M (ref 0–12)
RBC: 5.06 M/uL (ref 4.04–5.48)
RDW, POC: 13.2 %
WBC: 4.7 10*3/uL (ref 4.6–10.2)

## 2017-09-24 LAB — POCT URINALYSIS DIP (MANUAL ENTRY)
Bilirubin, UA: NEGATIVE
Blood, UA: NEGATIVE
Glucose, UA: NEGATIVE mg/dL
Ketones, POC UA: NEGATIVE mg/dL
NITRITE UA: NEGATIVE
PH UA: 5.5 (ref 5.0–8.0)
PROTEIN UA: NEGATIVE mg/dL
SPEC GRAV UA: 1.01 (ref 1.010–1.025)
UROBILINOGEN UA: 0.2 U/dL

## 2017-09-24 LAB — GLUCOSE, POCT (MANUAL RESULT ENTRY): POC GLUCOSE: 102 mg/dL — AB (ref 70–99)

## 2017-09-24 NOTE — Telephone Encounter (Signed)
Pt reports lightheadedness, onset yesterday morning. Moderate, intermittent; had to leave work. Not positional. States B/P yesterday 165/81-180/75; during NT call, 179/76, HRR at 61. Pt is not on any B/P medications. States more fatigued lately. Denies any CP, SOB,headache. States is staying hydrated. Appt made for today with B. Timmothy Euler. Care advise given. Husband will drive. Reason for Disposition . [1] MODERATE dizziness (e.g., interferes with normal activities) AND [2] has NOT been evaluated by physician for this  (Exception: dizziness caused by heat exposure, sudden standing, or poor fluid intake)  Answer Assessment - Initial Assessment Questions 1. DESCRIPTION: "Describe your dizziness."     Intermittent, not positional 2. LIGHTHEADED: "Do you feel lightheaded?" (e.g., somewhat faint, woozy, weak upon standing)     Yes 3. VERTIGO: "Do you feel like either you or the room is spinning or tilting?" (i.e. vertigo)     no 4. SEVERITY: "How bad is it?"  "Do you feel like you are going to faint?" "Can you stand and walk?"   - MILD - walking normally   - MODERATE - interferes with normal activities (e.g., work, school)    - SEVERE - unable to stand, requires support to walk, feels like passing out now.      Moderate; had to leave work yesterday 5. ONSET:  "When did the dizziness begin?"     Yesterday am 6. AGGRAVATING FACTORS: "Does anything make it worse?" (e.g., standing, change in head position)     no 7. HEART RATE: "Can you tell me your heart rate?" "How many beats in 15 seconds?"  (Note: not all patients can do this)       61  8. CAUSE: "What do you think is causing the dizziness?"     "Maybe B/P" 9. RECURRENT SYMPTOM: "Have you had dizziness before?" If so, ask: "When was the last time?" "What happened that time?"     no 10. OTHER SYMPTOMS: "Do you have any other symptoms?" (e.g., fever, chest pain, vomiting, diarrhea, bleeding)       Increased fatigued 11. PREGNANCY: "Is there any  chance you are pregnant?" "When was your last menstrual period?"       no  Protocols used: DIZZINESS [facility]

## 2017-09-24 NOTE — Patient Instructions (Addendum)
We have checked some labs today and should have results back within one week. I recommend discontinuing the keto detox pills. Follow up on Saturday for repeat blood pressure. Bring your blood pressure cuff to that visit. In terms of elevated blood pressure, I would like you to check your blood pressure at least a couple times over the next couple of days outside of the office and document these values. It is best if you check the blood pressure at different times in the day. Your goal is <140/90.  If you start to have chest pain, blurred vision, shortness of breath, severe headache, lower leg swelling, or nausea/vomiting please seek care immediately here or at the ED.     IF you received an x-ray today, you will receive an invoice from [facility]. Please contact [facility] at [phone] with questions or concerns regarding your invoice.   IF you received labwork today, you will receive an invoice from [facility]. Please contact [facility] at [phone] with questions or concerns regarding your invoice.   Our billing staff will not be able to assist you with questions regarding bills from these companies.  You will be contacted with the lab results as soon as they are available. The fastest way to get your results is to activate your My Chart account. Instructions are located on the last page of this paperwork. If you have not heard from [place] regarding the results in 2 weeks, please contact this office.

## 2017-09-24 NOTE — Progress Notes (Signed)
MRN: [id number] DOB: [birth year]  Subjective:   Alicia Parker is a 72 y.o. female presenting for feeling a little off for the past few days. She is feeling better today. She just returned back to work in September after retirement and is working 20-25 hrs per week. Thinks she is working too much. She also just started taking detox and keto pills and doubled the dose last week before she started feeling weird. Is wondering if that has something to do with it. Has checked her bp over the past few days. It has been elevated at 976B-341P systolically. BP cuff is old. Has not been taking any OTC decongestants. Denies recent illness, numbness, tingling, headache, fatigue, SOB, chest pain, ab pain, N/V/D, dizziness, chronic headache, double vision,palpitations, abdominal pain, hematuria, lower leg swelling. Lifestyle: Avoiding excessive salt intake. Trying to exercise on a regular basis. She smokes 1 ppd. Denies any other aggravating or relieving factors, no other questions or concerns.No PMH of HTN, heart disease, thryoid disease, diabetes, or stroke. FH of stroke and HTN in mother and HTN in father.   Alicia Parker has a current medication list which includes the following prescription(s): mometasone. Also has No Known Allergies.  Alicia Parker  has a past medical history of Cataract, GERD (gastroesophageal reflux disease), Hyperlipidemia, and Sleep apnea. Also  has a past surgical history that includes Appendectomy (1968); Cholecystectomy (1968); Reduction mammaplasty (Bilateral); Tubal ligation (1968); Toe Surgery; Colonoscopy (2004); and Upper gastrointestinal endoscopy.   Objective:   Vitals: BP (!) 146/76   Temp 98 F (36.7 C) (Oral)   Resp 18   Ht 5' 1.22" (1.555 m)   Wt 171 lb (77.6 kg)   LMP 06/16/1997   SpO2 98%   BMI 32.08 kg/m   Physical Exam  Constitutional: She is oriented to person, place, and time. She appears well-developed and well-nourished. No distress.  HENT:  Head: Normocephalic  and atraumatic.  Mouth/Throat: Uvula is midline, oropharynx is clear and moist and mucous membranes are normal. No tonsillar exudate.  Eyes: Pupils are equal, round, and reactive to light. Conjunctivae and EOM are normal.  Fundoscopic exam:      The right eye shows no hemorrhage and no papilledema. The right eye shows red reflex.       The left eye shows no hemorrhage and no papilledema. The left eye shows red reflex.  Neck: Normal range of motion and full passive range of motion without pain. No Brudzinski's sign and no Kernig's sign noted. No thyromegaly present.  Cardiovascular: Normal rate, regular rhythm, normal heart sounds and intact distal pulses.  Pulmonary/Chest: Effort normal and breath sounds normal. She has no wheezes. She has no rhonchi. She has no rales.  Musculoskeletal:       Right lower leg: She exhibits no swelling.       Left lower leg: She exhibits no swelling.  Neurological: She is alert and oriented to person, place, and time. She has normal strength and normal reflexes. No cranial nerve deficit or sensory deficit. She displays a negative Romberg sign. Gait normal.  Normal tandem walk   Skin: Skin is warm and dry.  Psychiatric: She has a normal mood and affect.  Vitals reviewed.   BP Readings from Last 3 Encounters:  09/24/17 (!) 146/76  02/16/17 125/76  02/13/17 129/79    Results for orders placed or performed in visit on 09/24/17 (from the past 24 hour(s))  POCT glucose (manual entry)     Status: Abnormal   Collection  Time: 09/24/17  2:31 PM  Result Value Ref Range   POC Glucose 102 (A) 70 - 99 mg/dl  POCT CBC     Status: None   Collection Time: 09/24/17  2:46 PM  Result Value Ref Range   WBC 4.7 4.6 - 10.2 K/uL   Lymph, poc 1.8 0.6 - 3.4   POC LYMPH PERCENT 38.9 10 - 50 %L   MID (cbc) 0.2 0 - 0.9   POC MID % 3.9 0 - 12 %M   POC Granulocyte 2.7 2 - 6.9   Granulocyte percent 57.2 37 - 80 %G   RBC 5.06 4.04 - 5.48 M/uL   Hemoglobin 13.9 12.2 - 16.2  g/dL   HCT, POC 43.6 37.7 - 47.9 %   MCV 86.2 80 - 97 fL   MCH, POC 27.5 27 - 31.2 pg   MCHC 31.9 31.8 - 35.4 g/dL   RDW, POC 13.2 %   Platelet Count, POC 287 142 - 424 K/uL   MPV 6.8 0 - 99.8 fL  POCT urinalysis dipstick     Status: Abnormal   Collection Time: 09/24/17  3:21 PM  Result Value Ref Range   Color, UA yellow yellow   Clarity, UA cloudy (A) clear   Glucose, UA negative negative mg/dL   Bilirubin, UA negative negative   Ketones, POC UA negative negative mg/dL   Spec Grav, UA 1.010 1.010 - 1.025   Blood, UA negative negative   pH, UA 5.5 5.0 - 8.0   Protein Ur, POC negative negative mg/dL   Urobilinogen, UA 0.2 0.2 or 1.0 E.U./dL   Nitrite, UA Negative Negative   Leukocytes, UA Trace (A) Negative   Orthostatic VS for the past 24 hrs:  BP- Lying Pulse- Lying BP- Sitting Pulse- Sitting BP- Standing at 0 minutes Pulse- Standing at 0 minutes  09/24/17 1503 159/82 56 154/88 60 153/84 65    EKG shows sinus bradycardia with rate of 55bpm. PR and QRS intervals/within normal limits. No acute changes noted from prior EKG of 01/2017. Findings presented and discussed with Dr. Pamella Pert.    Assessment and Plan :  1. Elevated blood pressure reading 2. Lightheadedness Pt is asx today. She is overall well appearing, no distress. BP is mildly elevated. Otherwise, vitals and poc labs are reassuring. No acute findings on EKG. Suspect new keto/detox tablets are playing a role in pt's sx. Recommend d/c them at this time. Labs pending. Recommend following up in 2 days for repeat bp in office. Given strict ED precautions.  - POCT CBC - POCT urinalysis dipstick - POCT glucose (manual entry) - CMP14+EGFR - TSH - EKG 12-Lead - Orthostatic vital signs - VITAMIN D 25 Hydroxy (Vit-D Deficiency, Fractures) - Magnesium   3. Leukocytes in urine No urinary sx. Urine cx pending. - Urine Culture - Urinalysis, microscopic only  Alicia Delaine, PA-C  [facility] 09/24/2017 3:22 PM

## 2017-09-25 LAB — TSH: TSH: 1.53 u[IU]/mL (ref 0.450–4.500)

## 2017-09-25 LAB — URINALYSIS, MICROSCOPIC ONLY
BACTERIA UA: NONE SEEN
Casts: NONE SEEN /lpf

## 2017-09-25 LAB — CMP14+EGFR
A/G RATIO: 2 (ref 1.2–2.2)
ALBUMIN: 4.3 g/dL (ref 3.5–4.8)
ALT: 16 IU/L (ref 0–32)
AST: 18 IU/L (ref 0–40)
Alkaline Phosphatase: 103 IU/L (ref 39–117)
BILIRUBIN TOTAL: 0.4 mg/dL (ref 0.0–1.2)
BUN/Creatinine Ratio: 15 (ref 12–28)
BUN: 10 mg/dL (ref 8–27)
CO2: 24 mmol/L (ref 20–29)
Calcium: 9.4 mg/dL (ref 8.7–10.3)
Chloride: 101 mmol/L (ref 96–106)
Creatinine, Ser: 0.67 mg/dL (ref 0.57–1.00)
GFR calc Af Amer: 103 mL/min/{1.73_m2} (ref >59)
GFR calc non Af Amer: 89 mL/min/{1.73_m2} (ref >59)
Globulin, Total: 2.2 g/dL (ref 1.5–4.5)
Glucose: 103 mg/dL — ABNORMAL HIGH (ref 65–99)
Potassium: 5 mmol/L (ref 3.5–5.2)
Sodium: 139 mmol/L (ref 134–144)
TOTAL PROTEIN: 6.5 g/dL (ref 6.0–8.5)

## 2017-09-25 LAB — MAGNESIUM: MAGNESIUM: 2.3 mg/dL (ref 1.6–2.3)

## 2017-09-25 LAB — URINE CULTURE

## 2017-09-25 LAB — VITAMIN D 25 HYDROXY (VIT D DEFICIENCY, FRACTURES): VIT D 25 HYDROXY: 11.5 ng/mL — AB (ref 30.0–100.0)

## 2017-09-26 ENCOUNTER — Ambulatory Visit (INDEPENDENT_AMBULATORY_CARE_PROVIDER_SITE_OTHER): Payer: Medicare HMO | Admitting: Family Medicine

## 2017-09-26 ENCOUNTER — Encounter: Payer: Self-pay | Admitting: Family Medicine

## 2017-09-26 VITALS — BP 156/78 | HR 65 | Temp 98.2°F | Resp 18 | Ht 60.0 in | Wt 172.0 lb

## 2017-09-26 DIAGNOSIS — F17200 Nicotine dependence, unspecified, uncomplicated: Secondary | ICD-10-CM

## 2017-09-26 DIAGNOSIS — I10 Essential (primary) hypertension: Secondary | ICD-10-CM | POA: Diagnosis not present

## 2017-09-26 MED ORDER — AMLODIPINE BESYLATE 2.5 MG PO TABS: 2.5000 mg | ORAL_TABLET | Freq: Every day | ORAL | 2 refills | Status: DC

## 2017-09-26 NOTE — Progress Notes (Signed)
4/13/20199:46 AM  Alicia Parker [birth year], 72 y.o. female [id number]  Chief Complaint  Patient presents with  . Hypertension    check on blood pressure control and review readings on home monitor    HPI:   Patient is a 72 y.o. female with past medical history significant for tobacco use who presents today for elevated BP at home.  She has been having readings 150-170s She otherwise has been feeling fine but BP runs in her family She denies vision changes, CP, palpitations, orthopnea, PND, lower ext edema, cough, sob She denies any recent oral decongestant or nsaids use She does drink daily, "I like my beer" She does smoke  Depression screen [facility] 2/9 09/24/2017 11/24/2016 11/24/2016  Decreased Interest 0 0 0  Down, Depressed, Hopeless 0 0 0  PHQ - 2 Score 0 0 0    No Known Allergies  Prior to Admission medications   Medication Sig Start Date End Date Taking? Authorizing Provider  mometasone (ELOCON) 0.1 % cream Apply 1 application topically daily as needed. 02/13/17   [provider]    Past Medical History:  Diagnosis Date  . Cataract   . GERD (gastroesophageal reflux disease)   . Hyperlipidemia    no current medications  . Sleep apnea    doesnt wear cpap     Past Surgical History:  Procedure Laterality Date  . APPENDECTOMY  1968  . CHOLECYSTECTOMY  1968  . COLONOSCOPY  2004   polyps  . REDUCTION MAMMAPLASTY Bilateral   . TOE SURGERY    . TUBAL LIGATION  1968  . UPPER GASTROINTESTINAL ENDOSCOPY      Social History   Tobacco Use  . Smoking status: Current Every Day Smoker    Packs/day: 1.00    Years: 10.00    Pack years: 10.00    Types: Cigarettes  . Smokeless tobacco: Never Used  Substance Use Topics  . Alcohol use: Yes    Alcohol/week: 3.6 oz    Types: 6 Cans of beer per week    Comment: daily-     Family History  Problem Relation Age of Onset  . Hypertension Mother   . Stroke Mother   . Hypertension Father   . Cancer Father 15         Leukemia  . Early death Brother        pneumonia  . Hypertension Maternal Grandmother   . Early death Brother   . Colon polyps Sister   . Colon cancer Neg Hx   . Esophageal cancer Neg Hx   . Rectal cancer Neg Hx   . Stomach cancer Neg Hx     ROS Per hpi  OBJECTIVE:  Blood pressure (!) 156/78, pulse 65, temperature 98.2 F (36.8 C), temperature source Oral, resp. rate 18, height 5' (1.524 m), weight 172 lb (78 kg), last menstrual period 06/16/1997, SpO2 100 %.  BP Readings from Last 3 Encounters:  09/26/17 (!) 156/78  09/24/17 (!) 146/76  02/16/17 125/76    Physical Exam  Constitutional: She is oriented to person, place, and time.  HENT:  Head: Normocephalic and atraumatic.  Mouth/Throat: Oropharynx is clear and moist. No oropharyngeal exudate.  Eyes: Pupils are equal, round, and reactive to light. EOM are normal. No scleral icterus.  Neck: Neck supple.  Cardiovascular: Normal rate, regular rhythm and normal heart sounds. Exam reveals no gallop and no friction rub.  No murmur heard. Pulmonary/Chest: Effort normal and breath sounds normal. She has no wheezes. She has  no rales.  Musculoskeletal: She exhibits no edema.  Neurological: She is alert and oriented to person, place, and time.  Skin: Skin is warm and dry.      Labs done in 09/2017 reviewed  ASSESSMENT and PLAN  1. Essential hypertension, benign Discussed LFM, new med r/se/b reviewed, check BP at home, patient educational handout given Strongly encouraged to cut back etoh  2. Tobacco use disorder Discussed quitting tob. She stopped smoking for 15 years, feels confident in being able to quit again.  Other orders - amLODipine (NORVASC) 2.5 MG tablet; Take 1 tablet (2.5 mg total) by mouth daily.  Return in about 1 month (around 10/24/2017).    Rutherford Guys, MD [facility] at [address] [place],  [address] Ph.  [phone] Fax [phone]

## 2017-09-26 NOTE — Patient Instructions (Addendum)
   IF you received an x-ray today, you will receive an invoice from [facility]. Please contact [facility] at [phone] with questions or concerns regarding your invoice.   IF you received labwork today, you will receive an invoice from [facility]. Please contact [facility] at [phone] with questions or concerns regarding your invoice.   Our billing staff will not be able to assist you with questions regarding bills from these companies.  You will be contacted with the lab results as soon as they are available. The fastest way to get your results is to activate your My Chart account. Instructions are located on the last page of this paperwork. If you have not heard from us regarding the results in 2 weeks, please contact this office.     DASH Eating Plan DASH stands for "Dietary Approaches to Stop Hypertension." The DASH eating plan is a healthy eating plan that has been shown to reduce high blood pressure (hypertension). It may also reduce your risk for type 2 diabetes, heart disease, and stroke. The DASH eating plan may also help with weight loss. What are tips for following this plan? General guidelines   Avoid eating more than 2,300 mg (milligrams) of salt (sodium) a day. If you have hypertension, you may need to reduce your sodium intake to 1,500 mg a day.  Limit alcohol intake to no more than 1 drink a day for nonpregnant women and 2 drinks a day for men. One drink equals 12 oz of beer, 5 oz of wine, or 1 oz of hard liquor.  Work with your health care provider to maintain a healthy body weight or to lose weight. Ask what an ideal weight is for you.  Get at least 30 minutes of exercise that causes your heart to beat faster (aerobic exercise) most days of the week. Activities may include walking, swimming, or biking.  Work with your health care provider or diet and nutrition specialist (dietitian) to adjust your eating plan to your individual calorie  needs. Reading food labels   Check food labels for the amount of sodium per serving. Choose foods with less than 5 percent of the Daily Value of sodium. Generally, foods with less than 300 mg of sodium per serving fit into this eating plan.  To find whole grains, look for the word "whole" as the first word in the ingredient list. Shopping   Buy products labeled as "low-sodium" or "no salt added."  Buy fresh foods. Avoid canned foods and premade or frozen meals. Cooking   Avoid adding salt when cooking. Use salt-free seasonings or herbs instead of table salt or sea salt. Check with your health care provider or pharmacist before using salt substitutes.  Do not fry foods. Cook foods using healthy methods such as baking, boiling, grilling, and broiling instead.  Cook with heart-healthy oils, such as olive, canola, soybean, or sunflower oil. Meal planning    Eat a balanced diet that includes:  5 or more servings of fruits and vegetables each day. At each meal, try to fill half of your plate with fruits and vegetables.  Up to 6-8 servings of whole grains each day.  Less than 6 oz of lean meat, poultry, or fish each day. A 3-oz serving of meat is about the same size as a deck of cards. One egg equals 1 oz.  2 servings of low-fat dairy each day.  A serving of nuts, seeds, or beans 5 times each week.  Heart-healthy fats. Healthy fats called   Omega-3 fatty acids are found in foods such as flaxseeds and coldwater fish, like sardines, salmon, and mackerel.  Limit how much you eat of the following:  Canned or prepackaged foods.  Food that is high in trans fat, such as fried foods.  Food that is high in saturated fat, such as fatty meat.  Sweets, desserts, sugary drinks, and other foods with added sugar.  Full-fat dairy products.  Do not salt foods before eating.  Try to eat at least 2 vegetarian meals each week.  Eat more home-cooked food and less restaurant, buffet, and fast  food.  When eating at a restaurant, ask that your food be prepared with less salt or no salt, if possible. What foods are recommended? The items listed may not be a complete list. Talk with your dietitian about what dietary choices are best for you. Grains  Whole-grain or whole-wheat bread. Whole-grain or whole-wheat pasta. Brown rice. Oatmeal. Quinoa. Bulgur. Whole-grain and low-sodium cereals. Pita bread. Low-fat, low-sodium crackers. Whole-wheat flour tortillas. Vegetables  Fresh or frozen vegetables (raw, steamed, roasted, or grilled). Low-sodium or reduced-sodium tomato and vegetable juice. Low-sodium or reduced-sodium tomato sauce and tomato paste. Low-sodium or reduced-sodium canned vegetables. Fruits  All fresh, dried, or frozen fruit. Canned fruit in natural juice (without added sugar). Meat and other protein foods  Skinless chicken or turkey. Ground chicken or turkey. Pork with fat trimmed off. Fish and seafood. Egg whites. Dried beans, peas, or lentils. Unsalted nuts, nut butters, and seeds. Unsalted canned beans. Lean cuts of beef with fat trimmed off. Low-sodium, lean deli meat. Dairy  Low-fat (1%) or fat-free (skim) milk. Fat-free, low-fat, or reduced-fat cheeses. Nonfat, low-sodium ricotta or cottage cheese. Low-fat or nonfat yogurt. Low-fat, low-sodium cheese. Fats and oils  Soft margarine without trans fats. Vegetable oil. Low-fat, reduced-fat, or light mayonnaise and salad dressings (reduced-sodium). Canola, safflower, olive, soybean, and sunflower oils. Avocado. Seasoning and other foods  Herbs. Spices. Seasoning mixes without salt. Unsalted popcorn and pretzels. Fat-free sweets. What foods are not recommended? The items listed may not be a complete list. Talk with your dietitian about what dietary choices are best for you. Grains  Baked goods made with fat, such as croissants, muffins, or some breads. Dry pasta or rice meal packs. Vegetables  Creamed or fried vegetables.  Vegetables in a cheese sauce. Regular canned vegetables (not low-sodium or reduced-sodium). Regular canned tomato sauce and paste (not low-sodium or reduced-sodium). Regular tomato and vegetable juice (not low-sodium or reduced-sodium). Pickles. Olives. Fruits  Canned fruit in a light or heavy syrup. Fried fruit. Fruit in cream or butter sauce. Meat and other protein foods  Fatty cuts of meat. Ribs. Fried meat. Bacon. Sausage. Bologna and other processed lunch meats. Salami. Fatback. Hotdogs. Bratwurst. Salted nuts and seeds. Canned beans with added salt. Canned or smoked fish. Whole eggs or egg yolks. Chicken or turkey with skin. Dairy  Whole or 2% milk, cream, and half-and-half. Whole or full-fat cream cheese. Whole-fat or sweetened yogurt. Full-fat cheese. Nondairy creamers. Whipped toppings. Processed cheese and cheese spreads. Fats and oils  Butter. Stick margarine. Lard. Shortening. Ghee. Bacon fat. Tropical oils, such as coconut, palm kernel, or palm oil. Seasoning and other foods  Salted popcorn and pretzels. Onion salt, garlic salt, seasoned salt, table salt, and sea salt. Worcestershire sauce. Tartar sauce. Barbecue sauce. Teriyaki sauce. Soy sauce, including reduced-sodium. Steak sauce. Canned and packaged gravies. Fish sauce. Oyster sauce. Cocktail sauce. Horseradish that you find on the shelf. Ketchup. Mustard. Meat flavorings   Relishes. Regular salad dressings. Where to find more information:  National Heart, Lung, and Blood Institute: www.nhlbi.nih.gov  American Heart Association: www.heart.org Summary  The DASH eating plan is a healthy eating plan that has been shown to reduce high blood pressure (hypertension). It may also reduce your risk for type 2 diabetes, heart disease, and stroke.  With the DASH eating plan, you should limit salt (sodium) intake to 2,300 mg a  day. If you have hypertension, you may need to reduce your sodium intake to 1,500 mg a day.  When on the DASH eating plan, aim to eat more fresh fruits and vegetables, whole grains, lean proteins, low-fat dairy, and heart-healthy fats.  Work with your health care provider or diet and nutrition specialist (dietitian) to adjust your eating plan to your individual calorie needs. This information is not intended to replace advice given to you by your health care provider. Make sure you discuss any questions you have with your health care provider. Document Released: 05/22/2011 Document Revised: 05/26/2016 Document Reviewed: 05/26/2016 Elsevier Interactive Patient Education  2018 Elsevier Inc.  

## 2017-09-28 ENCOUNTER — Ambulatory Visit: Payer: Self-pay | Admitting: *Deleted

## 2017-09-28 NOTE — Telephone Encounter (Signed)
Pt reports B/P this afternoon 169/88. During call  166/84. Pt seen last week, in for B/P check Saturday 09/26/17 ( 156/78) and placed on Norvasc 2.5mg  QD. Pt did not check B/P until today as she purchased new monitor today. Denies headache, SOB, CP, dizziness, no one sided weakness or facial droop, speech clear. Pt states is following diet as recommended and taking medication as ordered.  Appt made with Rhetta Mura for Wednesday 09/30/17. Care advise given per protocol.  Reason for Disposition . Systolic BP  >= 003 OR Diastolic >= 704  Answer Assessment - Initial Assessment Questions 1. BLOOD PRESSURE: "What is the blood pressure?" "Did you take at least two measurements 5 minutes apart?"     159/88 at 1500; 166/84 during call 2. ONSET: "When did you take your blood pressure?"     1500 3. HOW: "How did you obtain the blood pressure?" (e.g., visiting nurse, automatic home BP monitor)     Home monitor (new) 4. HISTORY: "Do you have a history of high blood pressure?"     yes 5. MEDICATIONS: "Are you taking any medications for blood pressure?" "Have you missed any doses recently?"     Yes, Norvasc 2.5mg , no missed doses 6. OTHER SYMPTOMS: "Do you have any symptoms?" (e.g., headache, chest pain, blurred vision, difficulty breathing, weakness)     No  Does report "Pain from hemorrhoids"  7. PREGNANCY: "Is there any chance you are pregnant?" "When was your last menstrual period?"     no  Protocols used: HIGH BLOOD PRESSURE-A-AH

## 2017-09-30 ENCOUNTER — Other Ambulatory Visit: Payer: Self-pay

## 2017-09-30 ENCOUNTER — Ambulatory Visit (INDEPENDENT_AMBULATORY_CARE_PROVIDER_SITE_OTHER): Payer: Medicare HMO | Admitting: Physician Assistant

## 2017-09-30 ENCOUNTER — Encounter: Payer: Self-pay | Admitting: Physician Assistant

## 2017-09-30 VITALS — BP 126/60 | HR 67 | Temp 98.3°F | Resp 18 | Ht 60.0 in | Wt 172.4 lb

## 2017-09-30 DIAGNOSIS — N898 Other specified noninflammatory disorders of vagina: Secondary | ICD-10-CM | POA: Diagnosis not present

## 2017-09-30 LAB — POCT WET + KOH PREP
Trich by wet prep: ABSENT
YEAST BY WET PREP: ABSENT
Yeast by KOH: ABSENT

## 2017-09-30 MED ORDER — ESTRADIOL 0.1 MG/GM VA CREA: 1.0000 | TOPICAL_CREAM | Freq: Every day | VAGINAL | 12 refills | Status: DC

## 2017-09-30 NOTE — Patient Instructions (Addendum)
If you vaginal symptoms are not better in three weeks then come back so we can do an exam.     IF you received an x-ray today, you will receive an invoice from [facility]. Please contact [facility] at [phone] with questions or concerns regarding your invoice.   IF you received labwork today, you will receive an invoice from [facility]. Please contact [facility] at [phone] with questions or concerns regarding your invoice.   Our billing staff will not be able to assist you with questions regarding bills from these companies.  You will be contacted with the lab results as soon as they are available. The fastest way to get your results is to activate your My Chart account. Instructions are located on the last page of this paperwork. If you have not heard from [place] regarding the results in 2 weeks, please contact this office.

## 2017-09-30 NOTE — Progress Notes (Signed)
09/30/2017 1:42 PM   DOB: 1945-09-28 / MRN: [id number]  SUBJECTIVE:  Alicia Parker is a 72 y.o. female presenting for multiple concerns.  States for the last 2 weeks she has had  some vaginal itching.  She denies any discharge at this time.  She denies any bleeding.  She has not tried anything for this.  Her last sexual encounter was with her husband about 4 months ago.  She states this was not painful.  She wants to go over her blood pressure today.  She is been taking Norvasc 2.5 for 3 days.  She does have an appointment scheduled with another provider at the practice and plans to keep this.  I have encouraged her to go to that appointment for further evaluation.  She has No Known Allergies.   She  has a past medical history of Cataract, GERD (gastroesophageal reflux disease), Hyperlipidemia, and Sleep apnea.    She  reports that she has been smoking cigarettes.  She has a 10.00 pack-year smoking history. She has never used smokeless tobacco. She reports that she drinks about 3.6 oz of alcohol per week. She reports that she does not use drugs. She  reports that she does not engage in sexual activity. The patient  has a past surgical history that includes Appendectomy (1968); Cholecystectomy (1968); Reduction mammaplasty (Bilateral); Tubal ligation (1968); Toe Surgery; Colonoscopy (2004); and Upper gastrointestinal endoscopy.  Her family history includes Cancer (age of onset: 55) in her father; Colon polyps in her sister; Early death in her brother and brother; Hypertension in her father, maternal grandmother, and mother; Stroke in her mother.  Review of Systems  Constitutional: Negative for chills, diaphoresis and fever.  Eyes: Negative.   Respiratory: Negative for cough, hemoptysis, sputum production, shortness of breath and wheezing.   Cardiovascular: Negative for chest pain, orthopnea and leg swelling.  Gastrointestinal: Negative for blood in stool, constipation, diarrhea and nausea.    Genitourinary: Negative for dysuria, flank pain, frequency, hematuria and urgency.  Skin: Negative for rash.  Neurological: Negative for dizziness, sensory change, speech change, focal weakness and headaches.    The problem list and medications were reviewed and updated by myself where necessary and exist elsewhere in the encounter.   OBJECTIVE:  BP 126/60   Pulse 67   Temp 98.3 F (36.8 C) (Oral)   Resp 18   Ht 5' (1.524 m)   Wt 172 lb 6.4 oz (78.2 kg)   LMP 06/16/1997   SpO2 97%   BMI 33.67 kg/m   BP Readings from Last 3 Encounters:  09/30/17 126/60  09/26/17 (!) 156/78  09/24/17 (!) 146/76    Physical Exam  Constitutional: She is oriented to person, place, and time. She appears well-developed and well-nourished. No distress.  Eyes: Pupils are equal, round, and reactive to light. EOM are normal.  Cardiovascular: Normal rate, regular rhythm, S1 normal, S2 normal, normal heart sounds and intact distal pulses. Exam reveals no gallop, no friction rub and no decreased pulses.  No murmur heard. Pulmonary/Chest: Effort normal. No stridor. No respiratory distress. She has no wheezes. She has no rales.  Abdominal: She exhibits no distension.  Musculoskeletal: Normal range of motion. She exhibits no edema.  Neurological: She is alert and oriented to person, place, and time. No cranial nerve deficit. Gait normal.  Skin: Skin is warm and dry. She is not diaphoretic.  Psychiatric: She has a normal mood and affect.  Vitals reviewed.   Results for orders placed or  performed in visit on 09/30/17 (from the past 72 hour(s))  POCT Wet + KOH Prep     Status: Abnormal   Collection Time: 09/30/17 11:05 AM  Result Value Ref Range   Yeast by KOH Absent Absent   Yeast by wet prep Absent Absent   WBC by wet prep Few Few   Clue Cells Wet Prep HPF POC None None   Trich by wet prep Absent Absent   Bacteria Wet Prep HPF POC Moderate (A) Few   Epithelial Cells By [employer] Pref ([facility]) Few None,  Few, Too numerous to count   RBC,UR,HPF,POC None None RBC/hpf    No results found.  ASSESSMENT AND PLAN:  Jahyra was seen today for hypertension and vaginal discharge.  Diagnoses and all orders for this visit:  Vaginal discharge: Patient negative for yeast, trichomoniasis, bacterial vaginosis.  I will try her on Estrace.  Of advised that she come back in 3 weeks if she is not feeling better so we can do a pelvic. -     POCT Wet + KOH Prep -     GC/Chlamydia Probe Amp  Other orders -     estradiol (ESTRACE VAGINAL) 0.1 MG/GM vaginal cream; Place 1 Applicatorful vaginally at bedtime.    The patient is advised to call or return to clinic if she does not see an improvement in symptoms, or to seek the care of the closest emergency department if she worsens with the above plan.   Philis Fendt, MHS, PA-C Primary Care at [facility] 09/30/2017 1:42 PM

## 2017-10-02 ENCOUNTER — Other Ambulatory Visit: Payer: Self-pay | Admitting: Physician Assistant

## 2017-10-02 MED ORDER — VITAMIN D (ERGOCALCIFEROL) 1.25 MG (50000 UNIT) PO CAPS: 50000.0000 [IU] | ORAL_CAPSULE | ORAL | 0 refills | Status: AC

## 2017-10-02 NOTE — Progress Notes (Signed)
Meds ordered this encounter  Medications  . Vitamin D, Ergocalciferol, (DRISDOL) 50000 units CAPS capsule    Sig: Take 1 capsule (50,000 Units total) by mouth every 7 (seven) days for 6 doses.    Dispense:  6 capsule    Refill:  0    Order Specific Question:   Supervising Provider    Answer:   Wardell Honour [[id number]]

## 2017-10-06 LAB — GC/CHLAMYDIA PROBE AMP
CHLAMYDIA, DNA PROBE: NEGATIVE
NEISSERIA GONORRHOEAE BY PCR: NEGATIVE

## 2017-10-29 ENCOUNTER — Other Ambulatory Visit: Payer: Self-pay

## 2017-10-29 ENCOUNTER — Ambulatory Visit (INDEPENDENT_AMBULATORY_CARE_PROVIDER_SITE_OTHER): Payer: Medicare HMO | Admitting: Family Medicine

## 2017-10-29 ENCOUNTER — Encounter: Payer: Self-pay | Admitting: Family Medicine

## 2017-10-29 VITALS — BP 144/86 | HR 85 | Temp 98.3°F | Resp 16 | Ht 60.0 in | Wt 170.8 lb

## 2017-10-29 DIAGNOSIS — F17200 Nicotine dependence, unspecified, uncomplicated: Secondary | ICD-10-CM | POA: Diagnosis not present

## 2017-10-29 DIAGNOSIS — I10 Essential (primary) hypertension: Secondary | ICD-10-CM

## 2017-10-29 MED ORDER — AMLODIPINE BESYLATE 2.5 MG PO TABS: 2.5000 mg | ORAL_TABLET | Freq: Every day | ORAL | 3 refills | Status: DC

## 2017-10-29 NOTE — Patient Instructions (Signed)
     IF you received an x-ray today, you will receive an invoice from [facility]. Please contact [facility] at [phone] with questions or concerns regarding your invoice.   IF you received labwork today, you will receive an invoice from [facility]. Please contact [facility] at [phone] with questions or concerns regarding your invoice.   Our billing staff will not be able to assist you with questions regarding bills from these companies.  You will be contacted with the lab results as soon as they are available. The fastest way to get your results is to activate your My Chart account. Instructions are located on the last page of this paperwork. If you have not heard from us regarding the results in 2 weeks, please contact this office.     

## 2017-10-29 NOTE — Progress Notes (Signed)
5/16/20195:05 PM  Alicia Parker 25-Sep-1945, 72 y.o. female [id number]  Chief Complaint  Patient presents with  . Hypertension    one month follow up     HPI:   Patient is a 72 y.o. female with past medical history significant for HTN and tob use who presents today for follow-up  At last visit started on low dose amlodipine, strongly encouraged to cut back on etoh and tob Continues to check BP at home, States > 140/90, Brings cuff today She reports has been cutting back on both Denies any side effects from BP meds  Fall Risk  10/29/2017 09/30/2017 09/24/2017 11/24/2016 11/24/2016  Falls in the past year? No No No No No     Depression screen [facility] 2/9 10/29/2017 09/30/2017 09/24/2017  Decreased Interest 0 0 0  Down, Depressed, Hopeless 0 0 0  PHQ - 2 Score 0 0 0    No Known Allergies  Prior to Admission medications   Medication Sig Start Date End Date Taking? Authorizing Provider  amLODipine (NORVASC) 2.5 MG tablet Take 1 tablet (2.5 mg total) by mouth daily. 09/26/17  Yes Rutherford Guys, MD  mometasone (ELOCON) 0.1 % cream Apply 1 application topically daily as needed. 02/13/17  Yes [provider]  Vitamin D, Ergocalciferol, (DRISDOL) 50000 units CAPS capsule Take 1 capsule (50,000 Units total) by mouth every 7 (seven) days for 6 doses. 10/02/17 11/07/17 Yes Timmothy Euler, [place] D, PA-C  estradiol (ESTRACE VAGINAL) 0.1 MG/GM vaginal cream Place 1 Applicatorful vaginally at bedtime. Patient not taking: Reported on 10/29/2017 09/30/17   Hillis Range    Past Medical History:  Diagnosis Date  . Cataract   . GERD (gastroesophageal reflux disease)   . Hyperlipidemia    no current medications  . Sleep apnea    doesnt wear cpap     Past Surgical History:  Procedure Laterality Date  . APPENDECTOMY  1968  . CHOLECYSTECTOMY  1968  . COLONOSCOPY  2004   polyps  . REDUCTION MAMMAPLASTY Bilateral   . TOE SURGERY    . TUBAL LIGATION  1968  . UPPER GASTROINTESTINAL  ENDOSCOPY      Social History   Tobacco Use  . Smoking status: Current Every Day Smoker    Packs/day: 1.00    Years: 10.00    Pack years: 10.00    Types: Cigarettes  . Smokeless tobacco: Never Used  Substance Use Topics  . Alcohol use: Yes    Alcohol/week: 3.6 oz    Types: 6 Cans of beer per week    Comment: daily-     Family History  Problem Relation Age of Onset  . Hypertension Mother   . Stroke Mother   . Hypertension Father   . Cancer Father 67       Leukemia  . Early death Brother        pneumonia  . Hypertension Maternal Grandmother   . Early death Brother   . Colon polyps Sister   . Colon cancer Neg Hx   . Esophageal cancer Neg Hx   . Rectal cancer Neg Hx   . Stomach cancer Neg Hx     Review of Systems  Constitutional: Negative for chills and fever.  Respiratory: Negative for cough and shortness of breath.   Cardiovascular: Negative for chest pain, palpitations and leg swelling.  Gastrointestinal: Negative for abdominal pain, nausea and vomiting.     OBJECTIVE:  Blood pressure 116/70, pulse 85, temperature 98.3 F (36.8 C), resp.  rate 16, height 5' (1.524 m), weight 170 lb 12.8 oz (77.5 kg), last menstrual period 06/16/1997, SpO2 98 %.  Physical Exam  Constitutional: She is oriented to person, place, and time. She appears well-developed and well-nourished.  HENT:  Head: Normocephalic and atraumatic.  Mouth/Throat: Oropharynx is clear and moist. No oropharyngeal exudate.  Eyes: Pupils are equal, round, and reactive to light. EOM are normal. No scleral icterus.  Neck: Neck supple.  Cardiovascular: Normal rate, regular rhythm and normal heart sounds. Exam reveals no gallop and no friction rub.  No murmur heard. Pulmonary/Chest: Effort normal and breath sounds normal. She has no wheezes. She has no rales.  Musculoskeletal: She exhibits no edema.  Neurological: She is alert and oriented to person, place, and time.  Skin: Skin is warm and dry.    Psychiatric: She has a normal mood and affect.  Nursing note and vitals reviewed.   ASSESSMENT and PLAN  1. Essential hypertension, benign Controlled. Discussed with patient discrepancy between her bp cuff and ours. Encouraged getting a new one. In the meanwhile cont with current regime of low dose amlodipine and LFM.   2. Tobacco use disorder Discussed treatment options, patient declines at this time. Feels confident in her ability to quit be herself.  Other orders - amLODipine (NORVASC) 2.5 MG tablet; Take 1 tablet (2.5 mg total) by mouth daily.  Return for June for annual exam.    Rutherford Guys, MD Primary Care at [facility] [place], [place] [address] Ph.  [phone] Fax [phone]

## 2017-11-19 ENCOUNTER — Encounter: Payer: Self-pay | Admitting: Family Medicine

## 2017-12-04 ENCOUNTER — Encounter: Payer: Self-pay | Admitting: Family Medicine

## 2017-12-04 ENCOUNTER — Other Ambulatory Visit: Payer: Self-pay

## 2017-12-04 ENCOUNTER — Ambulatory Visit (INDEPENDENT_AMBULATORY_CARE_PROVIDER_SITE_OTHER): Payer: Medicare HMO | Admitting: Family Medicine

## 2017-12-04 VITALS — BP 128/72 | HR 67 | Temp 98.3°F | Ht 60.16 in | Wt 167.0 lb

## 2017-12-04 DIAGNOSIS — Z0000 Encounter for general adult medical examination without abnormal findings: Secondary | ICD-10-CM | POA: Diagnosis not present

## 2017-12-04 DIAGNOSIS — Z23 Encounter for immunization: Secondary | ICD-10-CM | POA: Diagnosis not present

## 2017-12-04 DIAGNOSIS — Z130 Encounter for screening for diseases of the blood and blood-forming organs and certain disorders involving the immune mechanism: Secondary | ICD-10-CM

## 2017-12-04 DIAGNOSIS — E875 Hyperkalemia: Secondary | ICD-10-CM

## 2017-12-04 DIAGNOSIS — Z1231 Encounter for screening mammogram for malignant neoplasm of breast: Secondary | ICD-10-CM

## 2017-12-04 DIAGNOSIS — I10 Essential (primary) hypertension: Secondary | ICD-10-CM

## 2017-12-04 DIAGNOSIS — F17200 Nicotine dependence, unspecified, uncomplicated: Secondary | ICD-10-CM

## 2017-12-04 LAB — LIPID PANEL
Chol/HDL Ratio: 2.6 ratio (ref 0.0–4.4)
Cholesterol, Total: 179 mg/dL (ref 100–199)
HDL: 69 mg/dL (ref >39)
LDL Calculated: 98 mg/dL (ref 0–99)
Triglycerides: 60 mg/dL (ref 0–149)
VLDL Cholesterol Cal: 12 mg/dL (ref 5–40)

## 2017-12-04 LAB — CMP14+EGFR
ALT: 16 IU/L (ref 0–32)
AST: 15 IU/L (ref 0–40)
Albumin/Globulin Ratio: 1.7 (ref 1.2–2.2)
Albumin: 4.1 g/dL (ref 3.5–4.8)
Alkaline Phosphatase: 107 IU/L (ref 39–117)
BUN/Creatinine Ratio: 11 — ABNORMAL LOW (ref 12–28)
BUN: 8 mg/dL (ref 8–27)
Bilirubin Total: 0.5 mg/dL (ref 0.0–1.2)
CO2: 25 mmol/L (ref 20–29)
Calcium: 9.4 mg/dL (ref 8.7–10.3)
Chloride: 102 mmol/L (ref 96–106)
Creatinine, Ser: 0.71 mg/dL (ref 0.57–1.00)
GFR calc Af Amer: 100 mL/min/{1.73_m2} (ref >59)
GFR calc non Af Amer: 87 mL/min/{1.73_m2} (ref >59)
Globulin, Total: 2.4 g/dL (ref 1.5–4.5)
Glucose: 88 mg/dL (ref 65–99)
Potassium: 5.3 mmol/L — ABNORMAL HIGH (ref 3.5–5.2)
Sodium: 139 mmol/L (ref 134–144)
Total Protein: 6.5 g/dL (ref 6.0–8.5)

## 2017-12-04 LAB — CBC WITH DIFFERENTIAL/PLATELET
Basophils Absolute: 0 10*3/uL (ref 0.0–0.2)
Basos: 0 %
EOS (ABSOLUTE): 0.1 10*3/uL (ref 0.0–0.4)
Eos: 1 %
Hematocrit: 41.1 % (ref 34.0–46.6)
Hemoglobin: 13 g/dL (ref 11.1–15.9)
Immature Grans (Abs): 0 10*3/uL (ref 0.0–0.1)
Immature Granulocytes: 0 %
Lymphocytes Absolute: 2 10*3/uL (ref 0.7–3.1)
Lymphs: 39 %
MCH: 27.5 pg (ref 26.6–33.0)
MCHC: 31.6 g/dL (ref 31.5–35.7)
MCV: 87 fL (ref 79–97)
Monocytes Absolute: 0.5 10*3/uL (ref 0.1–0.9)
Monocytes: 9 %
Neutrophils Absolute: 2.5 10*3/uL (ref 1.4–7.0)
Neutrophils: 51 %
Platelets: 303 10*3/uL (ref 150–450)
RBC: 4.72 x10E6/uL (ref 3.77–5.28)
RDW: 14 % (ref 12.3–15.4)
WBC: 5 10*3/uL (ref 3.4–10.8)

## 2017-12-04 MED ORDER — AMLODIPINE BESYLATE 2.5 MG PO TABS: 2.5000 mg | ORAL_TABLET | Freq: Every day | ORAL | 1 refills | Status: DC

## 2017-12-04 NOTE — Patient Instructions (Addendum)
I have ordered either a referral or a diagnostic image. Please be mindful that it usually takes about 2 weeks to be called for an appointment. However if you have not be called for your appointment, please call us at [phone] and ask to speak with a referral clerk to further inquire about either your referral of diagnostic image.   IF you received an x-ray today, you will receive an invoice from [facility]. Please contact [facility] at [phone] with questions or concerns regarding your invoice.   IF you received labwork today, you will receive an invoice from [place] [facility]. Please contact [facility] at [phone] with questions or concerns regarding your invoice.   Our billing staff will not be able to assist you with questions regarding bills from these companies.  You will be contacted with the lab results as soon as they are available. The fastest way to get your results is to activate your My Chart account. Instructions are located on the last page of this paperwork. If you have not heard from [place] regarding the results in 2 weeks, please contact this office.     Preventive Care 71 Years and Older, Female Preventive care refers to lifestyle choices and visits with your health care provider that can promote health and wellness. What does preventive care include?  A yearly physical exam. This is also called an annual well check.  Dental exams once or twice a year.  Routine eye exams. Ask your health care provider how often you should have your eyes checked.  Personal lifestyle choices, including: ? Daily care of your teeth and gums. ? Regular physical activity. ? Eating a healthy diet. ? Avoiding tobacco and drug use. ? Limiting alcohol use. ? Practicing safe sex. ? Taking low-dose aspirin every day. ? Taking vitamin and mineral supplements as recommended by your health care provider. What happens during an annual well check? The services and screenings done  by your health care provider during your annual well check will depend on your age, overall health, lifestyle risk factors, and family history of disease. Counseling Your health care provider may ask you questions about your:  Alcohol use.  Tobacco use.  Drug use.  Emotional well-being.  Home and relationship well-being.  Sexual activity.  Eating habits.  History of falls.  Memory and ability to understand (cognition).  Work and work Statistician.  Reproductive health.  Screening You may have the following tests or measurements:  Height, weight, and BMI.  Blood pressure.  Lipid and cholesterol levels. These may be checked every 5 years, or more frequently if you are over 86 years old.  Skin check.  Lung cancer screening. You may have this screening every year starting at age 70 if you have a 30-pack-year history of smoking and currently smoke or have quit within the past 15 years.  Fecal occult blood test (FOBT) of the stool. You may have this test every year starting at age 78.  Flexible sigmoidoscopy or colonoscopy. You may have a sigmoidoscopy every 5 years or a colonoscopy every 10 years starting at age 50.  Hepatitis C blood test.  Hepatitis B blood test.  Sexually transmitted disease (STD) testing.  Diabetes screening. This is done by checking your blood sugar (glucose) after you have not eaten for a while (fasting). You may have this done every 1-3 years.  Bone density scan. This is done to screen for osteoporosis. You may have this done starting at age 39.  Mammogram. This may be done every  1-2 years. Talk to your health care provider about how often you should have regular mammograms.  Talk with your health care provider about your test results, treatment options, and if necessary, the need for more tests. Vaccines Your health care provider may recommend certain vaccines, such as:  Influenza vaccine. This is recommended every year.  Tetanus,  diphtheria, and acellular pertussis (Tdap, Td) vaccine. You may need a Td booster every 10 years.  Varicella vaccine. You may need this if you have not been vaccinated.  Zoster vaccine. You may need this after age 60.  Measles, mumps, and rubella (MMR) vaccine. You may need at least one dose of MMR if you were born in 1957 or later. You may also need a second dose.  Pneumococcal 13-valent conjugate (PCV13) vaccine. One dose is recommended after age 56.  Pneumococcal polysaccharide (PPSV23) vaccine. One dose is recommended after age 77.  Meningococcal vaccine. You may need this if you have certain conditions.  Hepatitis A vaccine. You may need this if you have certain conditions or if you travel or work in places where you may be exposed to hepatitis A.  Hepatitis B vaccine. You may need this if you have certain conditions or if you travel or work in places where you may be exposed to hepatitis B.  Haemophilus influenzae type b (Hib) vaccine. You may need this if you have certain conditions.  Talk to your health care provider about which screenings and vaccines you need and how often you need them. This information is not intended to replace advice given to you by your health care provider. Make sure you discuss any questions you have with your health care provider. Document Released: 06/29/2015 Document Revised: 02/20/2016 Document Reviewed: 04/03/2015 Elsevier Interactive Patient Education  [employer].

## 2017-12-04 NOTE — Progress Notes (Signed)
Presents today for [employer] Visit-Subsequent.   Date of last exam: 11/24/2016  Interpreter used for this visit? no  Patient Care Team: Hillis Range as PCP - General (Urgent Care)   Other items to address today: progress of tobacco cessation  Cancer Screening: Cervical: n/a Breast: mammogram, 2017, negative Colon: colonoscopy, 2017, + tubular adenoma polyps Prostate: n/a   Other Screening: Last screening for diabetes: 2018 Last lipid screening: 2018  ADVANCE DIRECTIVES: Discussed: no On File: no Materials Provided: yes, 2017  Immunization status:  Immunization History  Administered Date(s) Administered  . Influenza Split 06/19/2010  . Influenza,inj,Quad PF,6+ Mos 02/13/2017  . Pneumococcal Conjugate-13 11/24/2016  . Pneumococcal Polysaccharide-23 12/04/2017  . Tdap 10/26/2009     Health Maintenance Due  Topic Date Due  . MAMMOGRAM  12/02/2017  . PNA vac Low Risk Adult (2 of 2 - PPSV23) 11/24/2017     Functional Status Survey: Is the patient deaf or have difficulty hearing?: No Does the patient have difficulty seeing, even when wearing glasses/contacts?: Yes Does the patient have difficulty concentrating, remembering, or making decisions?: No Does the patient have difficulty walking or climbing stairs?: No Does the patient have difficulty dressing or bathing?: No Does the patient have difficulty doing errands alone such as visiting a doctor's office or shopping?: No   Patient Active Problem List   Diagnosis Date Noted  . Elevated LDL cholesterol level 10/18/2011  . Lumbar spondylosis 10/18/2011  . Obesity (BMI 30-39.9) 10/18/2011  . Hx of gastroesophageal reflux (GERD) 10/18/2011  . Bowens disease 10/18/2011  . History of colon polyps 10/18/2011     Past Medical History:  Diagnosis Date  . Cataract   . Essential hypertension, benign   . GERD (gastroesophageal reflux disease)   . Hyperlipidemia    no current  medications  . Sleep apnea    doesnt wear cpap      Past Surgical History:  Procedure Laterality Date  . APPENDECTOMY  1968  . CHOLECYSTECTOMY  1968  . COLONOSCOPY  2004   polyps  . REDUCTION MAMMAPLASTY Bilateral   . TOE SURGERY    . TUBAL LIGATION  1968  . UPPER GASTROINTESTINAL ENDOSCOPY       Family History  Problem Relation Age of Onset  . Hypertension Mother   . Stroke Mother   . Hypertension Father   . Cancer Father 57       Leukemia  . Early death Brother        pneumonia  . Hypertension Maternal Grandmother   . Early death Brother   . Colon polyps Sister   . Colon cancer Neg Hx   . Esophageal cancer Neg Hx   . Rectal cancer Neg Hx   . Stomach cancer Neg Hx      Social History   Tobacco Use  . Smoking status: Current Every Day Smoker    Packs/day: 1.00    Years: 10.00    Pack years: 10.00    Types: Cigarettes  . Smokeless tobacco: Never Used  Substance Use Topics  . Alcohol use: Yes    Alcohol/week: 3.6 oz    Types: 6 Cans of beer per week    Comment: daily-   . Drug use: No   Currently down from 1ppd to 0.25 ppd  No Known Allergies   Prior to Admission medications   Medication Sig Start Date End Date Taking? Authorizing Provider  amLODipine (NORVASC) 2.5 MG tablet Take 1 tablet (2.5  mg total) by mouth daily. 12/04/17  Yes Rutherford Guys, MD  mometasone (ELOCON) 0.1 % cream Apply 1 application topically daily as needed. 02/13/17  Yes [provider]     Depression screen [facility] 2/9 12/04/2017 12/04/2017 10/29/2017 09/30/2017 09/24/2017  Decreased Interest 0 0 0 0 0  Down, Depressed, Hopeless 0 0 0 0 0  PHQ - 2 Score 0 0 0 0 0     Fall Risk  12/04/2017 12/04/2017 10/29/2017 09/30/2017 09/24/2017  Falls in the past year? _0     Review of Systems  Constitutional: Negative for chills and fever.  HENT: Negative for congestion, ear pain, hearing loss and sore throat.   Eyes: Negative for blurred vision and double vision.    Respiratory: Negative for cough and shortness of breath.   Cardiovascular: Negative for chest pain, palpitations and leg swelling.  Gastrointestinal: Negative for abdominal pain, blood in stool, constipation, diarrhea, melena, nausea and vomiting.  Genitourinary: Negative for dysuria, frequency, hematuria and urgency.  Musculoskeletal: Negative for joint pain and myalgias.  Neurological: Negative for dizziness, speech change, focal weakness and headaches.  Endo/Heme/Allergies: Negative for polydipsia.  Psychiatric/Behavioral: Negative for depression. The patient is not nervous/anxious and does not have insomnia.   All other systems reviewed and are negative.   PHYSICAL EXAM: BP 128/72 (BP Location: Left Arm, Patient Position: Sitting, Cuff Size: Normal)   Pulse 67   Temp 98.3 F (36.8 C) (Oral)   Ht 5' 0.16" (1.528 m)   Wt 167 lb (75.8 kg)   LMP 06/16/1997   SpO2 97%   BMI 32.44 kg/m    Wt Readings from Last 3 Encounters:  12/04/17 167 lb (75.8 kg)  10/29/17 170 lb 12.8 oz (77.5 kg)  09/30/17 172 lb 6.4 oz (78.2 kg)    Physical Exam  Nursing note and vitals reviewed. Constitutional: She appears well-developed and well-nourished.  HENT:  Head: Normocephalic and atraumatic.  Right Ear: Hearing, tympanic membrane, external ear and ear canal normal.  Left Ear: Hearing, tympanic membrane, external ear and ear canal normal.  Mouth/Throat: Oropharynx is clear and moist.  Eyes: Pupils are equal, round, and reactive to light. Conjunctivae and EOM are normal.  Neck: Neck supple. No thyromegaly present.  Cardiovascular: Normal rate, regular rhythm, normal heart sounds and intact distal pulses. Exam reveals no gallop and no friction rub.  No murmur heard. Respiratory: Effort normal and breath sounds normal. She has no wheezes. She has no rales.  GI: Soft. Bowel sounds are normal. She exhibits no distension and no mass. There is no hepatosplenomegaly. There is no tenderness.   Musculoskeletal: Normal range of motion. She exhibits no edema.  Lymphadenopathy:    She has no cervical adenopathy.  Neurological: She is alert. She has normal strength and normal reflexes. No cranial nerve deficit. Coordination and gait normal.  Skin: Skin is warm and dry.  Psychiatric: She has a normal mood and affect.     Education/Counseling provided regarding diet and exercise, prevention of chronic diseases, smoking/tobacco cessation, if applicable, and reviewed "Covered Medicare Preventive Services."   ASSESSMENT/PLAN: 1. Encounter for medicare annual wellness exam No concerns per history or exam. Routine HCM labs ordered. HCM reviewed/discussed. Anticipatory guidance regarding healthy weight, lifestyle and choices given.   2. Tobacco use disorder Smoking cessation instruction/counseling given:  counseled patient on the dangers of tobacco use, advised patient to stop smoking, and reviewed strategies to maximize success  - Lipid panel  3. Need for vaccination -  Pneumococcal polysaccharide vaccine 23-valent greater than or equal to 2yo subcutaneous/IM  4. Visit for screening mammogram - MM Digital Screening; Future  5. Screening for deficiency anemia - CBC with Differential/Platelet  6. Essential hypertension, benign Controlled. Continue current regime.  - Lipid panel - CMP14+EGFR - amLODipine (NORVASC) 2.5 MG tablet; Take 1 tablet (2.5 mg total) by mouth daily.  Return in about 6 months (around 06/05/2018).

## 2017-12-04 NOTE — Progress Notes (Deleted)
   6/21/20198:53 AM  Annye English 07/08/1945, 72 y.o. female [id number]  Chief Complaint  Patient presents with  . Annual Exam    HPI:   Patient is a 72 y.o. female with past medical history significant for HTN and tob use who presents today for medicare annual visit  Cervical Cancer Screening: *** Breast Cancer Screening: *** Colorectal Cancer Screening: *** Bone Density Testing: *** HIV Screening: *** STI Screening: *** Seasonal Influenza Vaccination: *** Td/Tdap Vaccination: *** Pneumococcal Vaccination: *** Zoster Vaccination: *** Frequency of Dental evaluation: ***Q6 months Frequency of Eye evaluation: ***  Fall Risk  12/04/2017 12/04/2017 10/29/2017 09/30/2017 09/24/2017  Falls in the past year? No No No No No     Depression screen [facility] 2/9 12/04/2017 12/04/2017 10/29/2017  Decreased Interest 0 0 0  Down, Depressed, Hopeless 0 0 0  PHQ - 2 Score 0 0 0    No Known Allergies  Prior to Admission medications   Medication Sig Start Date End Date Taking? Authorizing Provider  amLODipine (NORVASC) 2.5 MG tablet Take 1 tablet (2.5 mg total) by mouth daily. 10/29/17  Yes Rutherford Guys, MD  mometasone (ELOCON) 0.1 % cream Apply 1 application topically daily as needed. 02/13/17  Yes [provider]    Past Medical History:  Diagnosis Date  . Cataract   . Essential hypertension, benign   . GERD (gastroesophageal reflux disease)   . Hyperlipidemia    no current medications  . Sleep apnea    doesnt wear cpap     Past Surgical History:  Procedure Laterality Date  . APPENDECTOMY  1968  . CHOLECYSTECTOMY  1968  . COLONOSCOPY  2004   polyps  . REDUCTION MAMMAPLASTY Bilateral   . TOE SURGERY    . TUBAL LIGATION  1968  . UPPER GASTROINTESTINAL ENDOSCOPY      Social History   Tobacco Use  . Smoking status: Current Every Day Smoker    Packs/day: 1.00    Years: 10.00    Pack years: 10.00    Types: Cigarettes  . Smokeless tobacco: Never Used  Substance  Use Topics  . Alcohol use: Yes    Alcohol/week: 3.6 oz    Types: 6 Cans of beer per week    Comment: daily-     Family History  Problem Relation Age of Onset  . Hypertension Mother   . Stroke Mother   . Hypertension Father   . Cancer Father 34       Leukemia  . Early death Brother        pneumonia  . Hypertension Maternal Grandmother   . Early death Brother   . Colon polyps Sister   . Colon cancer Neg Hx   . Esophageal cancer Neg Hx   . Rectal cancer Neg Hx   . Stomach cancer Neg Hx     ROS   OBJECTIVE:  Blood pressure 128/72, pulse 67, temperature 98.3 F (36.8 C), temperature source Oral, height 5' 0.16" (1.528 m), weight 167 lb (75.8 kg), last menstrual period 06/16/1997, SpO2 97 %.    Physical Exam  No results found for this or any previous visit (from the past 24 hour(s)).  No results found.   ASSESSMENT and PLAN  ***  No follow-ups on file.    Rutherford Guys, MD Primary Care at [place], [place] [address] Ph.  [phone] Fax [phone]

## 2017-12-11 ENCOUNTER — Other Ambulatory Visit: Payer: Self-pay | Admitting: Physician Assistant

## 2017-12-15 NOTE — Addendum Note (Signed)
Addended by: Rutherford Guys on: 12/15/2017 07:45 PM   Modules accepted: Orders

## 2017-12-21 ENCOUNTER — Telehealth: Payer: Self-pay | Admitting: Physician Assistant

## 2017-12-21 NOTE — Telephone Encounter (Signed)
Patient notified of results- note in nurse triage basket

## 2018-01-10 ENCOUNTER — Other Ambulatory Visit: Payer: Self-pay | Admitting: Physician Assistant

## 2018-01-11 ENCOUNTER — Ambulatory Visit: Payer: Self-pay | Admitting: Physician Assistant

## 2018-01-11 NOTE — Telephone Encounter (Signed)
Pt fell Sunday and is having right shoulder pain. Pt is having weakness to that arm. Pt stated that she cannot barely lift the arm and has to use the left arm to assist lifting right arm. Pt stated that she has been moving the shoulder and has used Aleve x 1 dose.  Pt stated that she fell from walking on uneven concrete. Denies bruising,swelling, rash,fever or numbness. Pt stated that she is having small amount of neck pain. Pt given care advice and pt verbalized understanding. Advised pt to try using a cold compress for 20 minutes twice a day and to support arm using a pillow.  Appt given for 01/12/18 at 9:20 am.  Reason for Disposition . [1] MODERATE pain (e.g., interferes with normal activities) AND [2] present > 3 days  Answer Assessment - Initial Assessment Questions 1. ONSET: "When did the pain start?"     Sunday 2. LOCATION: "Where is the pain located?"     Top of shoulder that connects to arm 3. PAIN: "How bad is the pain?" (Scale 1-10; or mild, moderate, severe)   - MILD (1-3): doesn't interfere with normal activities   - MODERATE (4-7): interferes with normal activities (e.g., work or school) or awakens from sleep   - SEVERE (8-10): excruciating pain, unable to do any normal activities, unable to move arm at all due to pain     moderate 4. WORK OR EXERCISE: "Has there been any recent work or exercise that involved this part of the body?"     No pt fell on Saturday 5. CAUSE: "What do you think is causing the shoulder pain?"     The fall  6. OTHER SYMPTOMS: "Do you have any other symptoms?" (e.g., neck pain, swelling, rash, fever, numbness, weakness)     Neck pain, weakness, no bruising 7. PREGNANCY: "Is there any chance you are pregnant?" "When was your last menstrual period?"     n/a  Protocols used: SHOULDER PAIN-A-AH

## 2018-01-12 ENCOUNTER — Ambulatory Visit (INDEPENDENT_AMBULATORY_CARE_PROVIDER_SITE_OTHER): Payer: Medicare HMO | Admitting: Physician Assistant

## 2018-01-12 ENCOUNTER — Other Ambulatory Visit: Payer: Self-pay

## 2018-01-12 ENCOUNTER — Ambulatory Visit (INDEPENDENT_AMBULATORY_CARE_PROVIDER_SITE_OTHER): Payer: Medicare HMO

## 2018-01-12 ENCOUNTER — Encounter: Payer: Self-pay | Admitting: Physician Assistant

## 2018-01-12 VITALS — BP 118/76 | HR 74 | Temp 98.8°F | Resp 16 | Ht 59.5 in | Wt 171.4 lb

## 2018-01-12 DIAGNOSIS — E875 Hyperkalemia: Secondary | ICD-10-CM

## 2018-01-12 DIAGNOSIS — S4991XA Unspecified injury of right shoulder and upper arm, initial encounter: Secondary | ICD-10-CM

## 2018-01-12 LAB — BASIC METABOLIC PANEL WITH GFR
BUN/Creatinine Ratio: 11 — ABNORMAL LOW (ref 12–28)
BUN: 9 mg/dL (ref 8–27)
Chloride: 100 mmol/L (ref 96–106)
GFR calc Af Amer: 84 mL/min/{1.73_m2} (ref >59)
GFR calc non Af Amer: 73 mL/min/{1.73_m2} (ref >59)
Potassium: 4.7 mmol/L (ref 3.5–5.2)

## 2018-01-12 LAB — BASIC METABOLIC PANEL
CO2: 23 mmol/L (ref 20–29)
Calcium: 9.4 mg/dL (ref 8.7–10.3)
Creatinine, Ser: 0.82 mg/dL (ref 0.57–1.00)
Glucose: 108 mg/dL — ABNORMAL HIGH (ref 65–99)
Sodium: 137 mmol/L (ref 134–144)

## 2018-01-12 NOTE — Patient Instructions (Addendum)
  Your x-ray does not show a fracture. I am referring you to orthopedics for evaluation of possible injury to the ligaments of your shoulder. You will receive a phone call to schedule this appointment.   Put ice in a plastic bag. Place a towel between your skin and the bag or between your plaster splint and the bag. Leave the ice on for 20 minutes, 2-3 times a day. Ibuprofen and/or Tylenol for pain. Keep the area elevated above the level of your heart. Do this 2-3 times a day until swelling improves.    Thank you for coming in today. I hope you feel we met your needs.  Feel free to call PCP if you have any questions or further requests.  Please consider signing up for MyChart if you do not already have it, as this is a great way to communicate with me.  Best,  Whitney McVey, PA-C  IF you received an x-ray today, you will receive an invoice from [facility]. Please contact [facility] at [phone] with questions or concerns regarding your invoice.   IF you received labwork today, you will receive an invoice from [place]. Please contact [facility] at [phone] with questions or concerns regarding your invoice.   Our billing staff will not be able to assist you with questions regarding bills from these companies.  You will be contacted with the lab results as soon as they are available. The fastest way to get your results is to activate your My Chart account. Instructions are located on the last page of this paperwork. If you have not heard from [place] regarding the results in 2 weeks, please contact this office.

## 2018-01-12 NOTE — Progress Notes (Signed)
Alicia Parker  MRN: [id number] DOB: 12-04-1945  PCP: Tereasa Coop, PA-C  Subjective:  Pt is a pleasant 72 year old female who presents to clinic for right shoulder pain s/p fall 4 days ago.  She was walking on uneven concrete in the dark when she tripped and fell onto her right side. Endorses shoulder pain and reduced range of motion since that time. 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 In order to lift her right arm she must use assistance from her left arm.  Pain is of the top of her right shoulder. Denies numbness, tingling, weakness, swelling, laceration.  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   She was supposed to rtc for lab only BMP after her annual exam, however did not come back.   Review of Systems  Musculoskeletal: Positive for arthralgias (right shoulder). Negative for joint swelling.  Skin: Negative.   Neurological: Negative for weakness and numbness.    Patient Active Problem List   Diagnosis Date Noted  . Elevated LDL cholesterol level 10/18/2011  . Lumbar spondylosis 10/18/2011  . Obesity (BMI 30-39.9) 10/18/2011  . Hx of gastroesophageal reflux (GERD) 10/18/2011  . Bowens disease 10/18/2011  . History of colon polyps 10/18/2011    Current Outpatient Medications on File Prior to Visit  Medication Sig Dispense Refill  . amLODipine (NORVASC) 2.5 MG tablet Take 1 tablet (2.5 mg total) by mouth daily. 90 tablet 1   No current facility-administered medications on file prior to visit.     No Known Allergies   Objective:  BP 118/76 (BP Location: Left Arm, Patient Position: Sitting, Cuff Size: Normal)   Pulse 74   Temp 98.8 F (37.1 C) (Oral)   Resp 16   Ht 4' 11.5" (1.511 m)   Wt 171 lb 6.4 oz (77.7 kg)   LMP 06/16/1997   SpO2 100%   BMI 34.04 kg/m   Physical Exam  Constitutional: She is oriented to person, place, and time. No distress.  Musculoskeletal:       Right shoulder: She exhibits decreased range of motion  (cannot abduct arm past 40 degrees) and tenderness. She exhibits no bony tenderness, no swelling, no effusion, no crepitus and no deformity.  No decreased ROM with assisted abduction.  Reduced ROM with reaching behind back from above shoulder and behind hip.  No decreased strength.   Neurological: She is alert and oriented to person, place, and time.  Skin: Skin is warm and dry.  Psychiatric: Judgment normal.  Vitals reviewed.  Dg Shoulder Right  Result Date: 01/12/2018 CLINICAL DATA:  Recent fall with right shoulder pain, initial encounter EXAM: RIGHT SHOULDER - 2+ VIEW COMPARISON:  None. FINDINGS: No acute fracture or dislocation is noted. Mild degenerative changes of the acromioclavicular joint are seen. Mild calcific changes of supraspinatus tendon is noted. IMPRESSION: Chronic changes without acute abnormality. Electronically Signed   By: Inez Catalina M.D.   On: 01/12/2018 09:52   Assessment and Plan :  1. Injury of right shoulder, initial encounter - Pt presents for right shoulder pain x 4 days s/p fall on uneven walking surface. X-ray negative. Suspect possible rotator cuff injury. Plan to refer to ortho for evaluation. Right arm placed in sling by CMA. Work note restriction written to remain out of work until evaluated by ortho. PRICE principle advised.  - DG Shoulder Right; Future - Ambulatory referral to Orthopedic Surgery  2. Increased potassium in the blood - Pt Mild elevation  of potassium at her medicare wellness exam with Dr. Pamella Pert last month and did not rtc for repeat labs. Will draw today and forward to [place].  - Basic Metabolic Panel    Mercer Pod, PA-C  [facility] at [place] 01/12/2018 9:24 AM  Please note: Portions of this report may have been transcribed using dragon voice recognition software. Every effort was made to ensure accuracy; however, inadvertent computerized transcription errors may be present.

## 2018-01-14 ENCOUNTER — Encounter: Payer: Self-pay | Admitting: Physician Assistant

## 2018-01-26 ENCOUNTER — Encounter (INDEPENDENT_AMBULATORY_CARE_PROVIDER_SITE_OTHER): Payer: Self-pay | Admitting: Orthopaedic Surgery

## 2018-01-26 ENCOUNTER — Ambulatory Visit (INDEPENDENT_AMBULATORY_CARE_PROVIDER_SITE_OTHER): Payer: Medicare HMO | Admitting: Orthopaedic Surgery

## 2018-01-26 ENCOUNTER — Other Ambulatory Visit (INDEPENDENT_AMBULATORY_CARE_PROVIDER_SITE_OTHER): Payer: Self-pay | Admitting: Radiology

## 2018-01-26 VITALS — BP 134/67 | HR 66 | Ht 60.0 in | Wt 169.0 lb

## 2018-01-26 DIAGNOSIS — M25511 Pain in right shoulder: Secondary | ICD-10-CM

## 2018-01-26 DIAGNOSIS — G8929 Other chronic pain: Secondary | ICD-10-CM

## 2018-01-26 MED ORDER — METHYLPREDNISOLONE ACETATE 40 MG/ML IJ SUSP
80.0000 mg | INTRAMUSCULAR | Status: AC | PRN
Start: 2018-01-26 — End: 2018-01-26
  Administered 2018-01-26: 80 mg

## 2018-01-26 MED ORDER — BUPIVACAINE HCL 0.5 % IJ SOLN
2.0000 mL | INTRAMUSCULAR | Status: AC | PRN
  Administered 2018-01-26: 2 mL via INTRA_ARTICULAR

## 2018-01-26 MED ORDER — LIDOCAINE HCL 2 % IJ SOLN
2.0000 mL | INTRAMUSCULAR | Status: AC | PRN
  Administered 2018-01-26: 2 mL

## 2018-01-26 NOTE — Progress Notes (Signed)
Office Visit Note   Patient: Alicia Parker           Date of Birth: 07-29-1945           MRN: [id number] Visit Date: 01/26/2018              Requested by: Dorise Hiss, PA-C [place], [place] [address] PCP: Tereasa Coop, PA-C   Assessment & Plan: Visit Diagnoses:  1. Acute pain of right shoulder     Plan: Impingement syndrome right shoulder.  Long discussion regarding possible diagnoses and treatment options.  Alicia Parker would like to try a subacromial cortisone injection and monitor her response.  We will give her a note to return to work on Monday, August 19 over the next several weeks if no improvement.  Then, consider MRI scan  Follow-Up Instructions: Return if symptoms worsen or fail to improve.   Orders:  Orders Placed This Encounter  Procedures  . Large Joint Inj: R subacromial bursa   No orders of the defined types were placed in this encounter.     Procedures: Large Joint Inj: R subacromial bursa on 01/26/2018 2:26 PM Indications: pain and diagnostic evaluation Details: 25 G 1.5 in needle, anterolateral approach  Arthrogram: No  Medications: 2 mL lidocaine 2 %; 2 mL bupivacaine 0.5 %; 80 mg methylPREDNISolone acetate 40 MG/ML Consent was given by the patient. Immediately prior to procedure a time out was called to verify the correct patient, procedure, equipment, support staff and site/side marked as required. Patient was prepped and draped in the usual sterile fashion.       Clinical Data: No additional findings.   Subjective: Chief Complaint  Patient presents with  . New Patient (Initial Visit)    R SHOULDER INJURY PT FELL 01/11/18 SEEN PCP, HAD X RAYS AND SHE REFERRED PT HERE  Alicia Parker is 72 years old and fell on her right shoulder about 2 weeks ago.  X-rays were performed through her primary care physician's office.  Films demonstrated no acute fracture or dislocation.  There were mild degenerative changes at the  acromioclavicular joint and some areas of calcific tendinitis about the supraspinatus tendon.  She is had difficulty raising her arm over her head and sleeping at night.  She has not worked in 2 weeks.  Denies any prior problems  HPI  Review of Systems  Constitutional: Positive for fatigue. Negative for fever.  HENT: Negative for ear pain.   Eyes: Negative for pain.  Respiratory: Negative for cough and shortness of breath.   Cardiovascular: Negative for leg swelling.  Gastrointestinal: Negative for constipation and diarrhea.  Genitourinary: Negative for difficulty urinating.  Musculoskeletal: Positive for back pain and neck pain.  Skin: Negative for rash.  Allergic/Immunologic: Negative for food allergies.  Neurological: Positive for weakness. Negative for numbness.  Hematological: Does not bruise/bleed easily.  Psychiatric/Behavioral: Positive for sleep disturbance.     Objective: Vital Signs: BP 134/67 (BP Location: Left Arm, Patient Position: Sitting, Cuff Size: Normal)   Pulse 66   Ht 5' (1.524 m)   Wt 169 lb (76.7 kg)   LMP 06/16/1997   BMI 33.01 kg/m   Physical Exam  Constitutional: She is oriented to person, place, and time. She appears well-developed and well-nourished.  HENT:  Mouth/Throat: Oropharynx is clear and moist.  Eyes: Pupils are equal, round, and reactive to light. EOM are normal.  Pulmonary/Chest: Effort normal.  Neurological: She is alert and oriented to person, place, and time.  Skin: Skin is warm and dry.  Psychiatric: She has a normal mood and affect. Her behavior is normal.    Ortho Exam awake alert and oriented x3.  Comfortable sitting.  Positive impingement extreme of internal/external rotation.  Able to place her right arm fully over her head.  Good strength.  Biceps intact.  Skin intact.  Does have some tenderness inferior to the acromioclavicular joint over the supraspinatus tendon.  Negative speeds sign.  Specialty Comments:  No specialty  comments available.  Imaging: No results found.   PMFS History: Patient Active Problem List   Diagnosis Date Noted  . Elevated LDL cholesterol level 10/18/2011  . Lumbar spondylosis 10/18/2011  . Obesity (BMI 30-39.9) 10/18/2011  . Hx of gastroesophageal reflux (GERD) 10/18/2011  . Bowens disease 10/18/2011  . History of colon polyps 10/18/2011   Past Medical History:  Diagnosis Date  . Cataract   . Essential hypertension, benign   . GERD (gastroesophageal reflux disease)   . Hyperlipidemia    no current medications  . Sleep apnea    doesnt wear cpap     Family History  Problem Relation Age of Onset  . Hypertension Mother   . Stroke Mother   . Hypertension Father   . Cancer Father 45       Leukemia  . Early death Brother        pneumonia  . Hypertension Maternal Grandmother   . Early death Brother   . Colon polyps Sister   . Colon cancer Neg Hx   . Esophageal cancer Neg Hx   . Rectal cancer Neg Hx   . Stomach cancer Neg Hx     Past Surgical History:  Procedure Laterality Date  . APPENDECTOMY  1968  . CHOLECYSTECTOMY  1968  . COLONOSCOPY  2004   polyps  . REDUCTION MAMMAPLASTY Bilateral   . TOE SURGERY    . TUBAL LIGATION  1968  . UPPER GASTROINTESTINAL ENDOSCOPY     Social History   Occupational History  . Not on file  Tobacco Use  . Smoking status: Current Every Day Smoker    Packs/day: 1.00    Years: 10.00    Pack years: 10.00    Types: Cigarettes  . Smokeless tobacco: Never Used  Substance and Sexual Activity  . Alcohol use: Yes    Alcohol/week: 6.0 standard drinks    Types: 6 Cans of beer per week    Comment: daily-   . Drug use: No  . Sexual activity: Yes    Partners: Male    Birth control/protection: Surgical    Comment: btl

## 2018-03-11 ENCOUNTER — Ambulatory Visit
Admission: RE | Admit: 2018-03-11 | Discharge: 2018-03-11 | Disposition: A | Discharge status: Home / Self Care | Payer: Medicare HMO | Source: Ambulatory Visit | Attending: Family Medicine | Admitting: Family Medicine

## 2018-03-11 DIAGNOSIS — Z1231 Encounter for screening mammogram for malignant neoplasm of breast: Secondary | ICD-10-CM

## 2018-03-21 ENCOUNTER — Ambulatory Visit
Admission: RE | Admit: 2018-03-21 | Discharge: 2018-03-21 | Disposition: A | Discharge status: Home / Self Care | Payer: Medicare HMO | Source: Ambulatory Visit | Attending: Orthopaedic Surgery | Admitting: Orthopaedic Surgery

## 2018-03-21 DIAGNOSIS — M25511 Pain in right shoulder: Principal | ICD-10-CM

## 2018-03-21 DIAGNOSIS — G8929 Other chronic pain: Secondary | ICD-10-CM

## 2018-03-25 ENCOUNTER — Telehealth (INDEPENDENT_AMBULATORY_CARE_PROVIDER_SITE_OTHER): Payer: Self-pay | Admitting: Orthopaedic Surgery

## 2018-03-25 NOTE — Telephone Encounter (Signed)
Please advise 

## 2018-03-25 NOTE — Telephone Encounter (Signed)
Called patient with results.  She said she will make appt with PW to go over what can be done

## 2018-03-25 NOTE — Telephone Encounter (Signed)
Patient called stating she had her MRI on 03/21/18 and is requesting a return call with the results.  Patient is unable to schedule an appointment at this time due to financial reasons.

## 2018-03-26 NOTE — Telephone Encounter (Signed)
Please call patient and schedule appt. Thank you.

## 2018-03-29 NOTE — Telephone Encounter (Signed)
thanks

## 2018-04-01 ENCOUNTER — Ambulatory Visit (INDEPENDENT_AMBULATORY_CARE_PROVIDER_SITE_OTHER): Payer: Medicare HMO | Admitting: Orthopaedic Surgery

## 2018-04-01 ENCOUNTER — Encounter (INDEPENDENT_AMBULATORY_CARE_PROVIDER_SITE_OTHER): Payer: Self-pay | Admitting: Orthopaedic Surgery

## 2018-04-01 VITALS — BP 144/66 | HR 57 | Ht 60.0 in | Wt 169.0 lb

## 2018-04-01 DIAGNOSIS — M25511 Pain in right shoulder: Secondary | ICD-10-CM | POA: Diagnosis not present

## 2018-04-01 DIAGNOSIS — G8929 Other chronic pain: Secondary | ICD-10-CM

## 2018-04-01 NOTE — Progress Notes (Signed)
Office Visit Note   Patient: Alicia Parker           Date of Birth: [birth year]           MRN: [id number] Visit Date: 04/01/2018              Requested by: Tereasa Coop, PA-C [place], [place] [address] PCP: Tereasa Coop, PA-C   Assessment & Plan: Visit Diagnoses:  1. Chronic right shoulder pain     Plan: MRI scan demonstrates tearing of the supra and infraspinatus tendons with retraction to the level of the acromioclavicular joint.  There is some intra substance tearing of the subscapularis.  Mrs. Deist relates that she does not have any trouble during the day but does have some discomfort trying to sleep at night.  She is able to raise her arm over her head.  I had a long discussion with her regarding the MRI scan findings and different treatment options over approximately 30 minutes.  I have discussed surgery and even physical therapy.  She would like to try physical therapy.  Her problem is been present since July when she fell.  Return in 1 month. I have discussed the potential plus and minuses of surgery versus nonsurgical treatment.  There is some atrophy on the MRI scan of both the infra and supraspinatus muscles.  Have discussed the natural history of cuff arthropathy.  Also discussed the potential benefit of rotator cuff tear repair, surgery, rehab, time in a sling.  Also discussed possibility of repeat tear should she choose surgery given the chronic nature of the cough and the atrophy.  Might be a candidate for supplemental patch  Follow-Up Instructions: Return in about 1 month (around 05/02/2018).   Orders:  No orders of the defined types were placed in this encounter.  No orders of the defined types were placed in this encounter.     Procedures: No procedures performed   Clinical Data: No additional findings.   Subjective: Chief Complaint  Patient presents with  . Follow-up    MRI REVIEW R SHOULDER    HPI  Review of Systems    Constitutional: Negative for fatigue and fever.  HENT: Negative for ear pain.   Eyes: Negative for pain.  Respiratory: Negative for cough and shortness of breath.   Cardiovascular: Negative for leg swelling.  Gastrointestinal: Negative for constipation and diarrhea.  Genitourinary: Negative for difficulty urinating.  Musculoskeletal: Positive for back pain and neck pain.  Skin: Negative for rash.  Allergic/Immunologic: Negative for food allergies.  Neurological: Positive for weakness. Negative for numbness.  Hematological: Does not bruise/bleed easily.  Psychiatric/Behavioral: Positive for sleep disturbance.     Objective: Vital Signs: BP (!) 144/66 (BP Location: Left Arm, Patient Position: Sitting, Cuff Size: Normal)   Pulse (!) 57   Ht 5' (1.524 m)   Wt 169 lb (76.7 kg)   LMP 06/16/1997   BMI 33.01 kg/m   Physical Exam  Constitutional: She is oriented to person, place, and time. She appears well-developed and well-nourished.  HENT:  Mouth/Throat: Oropharynx is clear and moist.  Eyes: Pupils are equal, round, and reactive to light. EOM are normal.  Pulmonary/Chest: Effort normal.  Neurological: She is alert and oriented to person, place, and time.  Skin: Skin is warm and dry.  Psychiatric: She has a normal mood and affect. Her behavior is normal.    Ortho Exam awake alert and oriented x3.  Comfortable sitting.  Able to place right  arm quickly overhead.  Abduction over 90 degrees.  Very minimal tenderness along the anterior subacromial region.  Minimal discomfort over the acromioclavicular joint.  Biceps appears to be intact.  No pain along the biceps tendon.  Skin intact.  Neurovascular exam intact.  Positive impingement on extremes of external rotation.  Positive empty can testing.  Does have a little bit of weakness with external rotation. MRI scan does demonstrate tearing of the infra and supraspinatus with some retraction.  Specialty Comments:  No specialty comments  available.  Imaging: No results found.   PMFS History: Patient Active Problem List   Diagnosis Date Noted  . Elevated LDL cholesterol level 10/18/2011  . Lumbar spondylosis 10/18/2011  . Obesity (BMI 30-39.9) 10/18/2011  . Hx of gastroesophageal reflux (GERD) 10/18/2011  . Bowens disease 10/18/2011  . History of colon polyps 10/18/2011   Past Medical History:  Diagnosis Date  . Cataract   . Essential hypertension, benign   . GERD (gastroesophageal reflux disease)   . Hyperlipidemia    no current medications  . Sleep apnea    doesnt wear cpap     Family History  Problem Relation Age of Onset  . Hypertension Mother   . Stroke Mother   . Hypertension Father   . Cancer Father 66       Leukemia  . Early death Brother        pneumonia  . Hypertension Maternal Grandmother   . Early death Brother   . Colon polyps Sister   . Colon cancer Neg Hx   . Esophageal cancer Neg Hx   . Rectal cancer Neg Hx   . Stomach cancer Neg Hx     Past Surgical History:  Procedure Laterality Date  . APPENDECTOMY  1968  . CHOLECYSTECTOMY  1968  . COLONOSCOPY  2004   polyps  . REDUCTION MAMMAPLASTY Bilateral   . TOE SURGERY    . TUBAL LIGATION  1968  . UPPER GASTROINTESTINAL ENDOSCOPY     Social History   Occupational History  . Not on file  Tobacco Use  . Smoking status: Current Every Day Smoker    Packs/day: 1.00    Years: 10.00    Pack years: 10.00    Types: Cigarettes  . Smokeless tobacco: Never Used  Substance and Sexual Activity  . Alcohol use: Yes    Alcohol/week: 6.0 standard drinks    Types: 6 Cans of beer per week    Comment: daily-   . Drug use: No  . Sexual activity: Yes    Partners: Male    Birth control/protection: Surgical    Comment: btl

## 2018-05-28 ENCOUNTER — Other Ambulatory Visit: Payer: Self-pay | Admitting: Family Medicine

## 2018-05-28 NOTE — Telephone Encounter (Signed)
Requested Prescriptions  Pending Prescriptions Disp Refills  . amLODipine (NORVASC) 2.5 MG tablet [Pharmacy Med Name: AMLODIPINE BESYLATE 2.5 MG TAB] 90 tablet 0    Sig: TAKE 1 TABLET BY MOUTH EVERY DAY     Cardiovascular:  Calcium Channel Blockers Failed - 05/28/2018 11:05 AM      Failed - Last BP in normal range    BP Readings from Last 1 Encounters:  04/01/18 (!) 144/66         Passed - Valid encounter within last 6 months    Recent Outpatient Visits          4 months ago Injury of right shoulder, initial encounter   [facility], Gelene Mink, PA-C   5 months ago Encounter for [employer] annual wellness exam   [facility], Lilia Argue, MD   7 months ago Essential hypertension, benign   [facility], Lilia Argue, MD   8 months ago Vaginal discharge   [facility], PA-C   8 months ago Essential hypertension, benign   [facility], Lilia Argue, MD      Future Appointments            In 1 week Rutherford Guys, MD [facility] [place], [place] 3 BP's in office notes : 144/66, 134/67, 118/76

## 2018-06-04 ENCOUNTER — Encounter: Payer: Self-pay | Admitting: Family Medicine

## 2018-06-04 ENCOUNTER — Ambulatory Visit (INDEPENDENT_AMBULATORY_CARE_PROVIDER_SITE_OTHER): Payer: Medicare HMO | Admitting: Family Medicine

## 2018-06-04 VITALS — BP 126/79 | HR 74 | Temp 97.8°F | Resp 18 | Ht 60.0 in | Wt 176.6 lb

## 2018-06-04 DIAGNOSIS — M25511 Pain in right shoulder: Secondary | ICD-10-CM | POA: Diagnosis not present

## 2018-06-04 DIAGNOSIS — F17210 Nicotine dependence, cigarettes, uncomplicated: Secondary | ICD-10-CM

## 2018-06-04 DIAGNOSIS — G8929 Other chronic pain: Secondary | ICD-10-CM | POA: Diagnosis not present

## 2018-06-04 DIAGNOSIS — I10 Essential (primary) hypertension: Secondary | ICD-10-CM

## 2018-06-04 DIAGNOSIS — F17200 Nicotine dependence, unspecified, uncomplicated: Secondary | ICD-10-CM

## 2018-06-04 MED ORDER — MELOXICAM 7.5 MG PO TABS: 7.5000 mg | ORAL_TABLET | Freq: Every day | ORAL | 0 refills | Status: DC

## 2018-06-04 MED ORDER — BUPROPION HCL ER (SR) 150 MG PO TB12: 150.0000 mg | ORAL_TABLET | Freq: Two times a day (BID) | ORAL | 2 refills | Status: DC

## 2018-06-04 MED ORDER — AMLODIPINE BESYLATE 2.5 MG PO TABS: 2.5000 mg | ORAL_TABLET | Freq: Every day | ORAL | 3 refills | Status: DC

## 2018-06-04 NOTE — Progress Notes (Signed)
12/20/20198:18 AM  Fuller Plan 1946/02/03, 72 y.o. female [id number]  Chief Complaint  Patient presents with  . other    patient does not know why she is here; was just told to follow up in 6 months    HPI:   Patient is a 72 y.o. female with past medical history significant for HTN  and tobacco use who presents today for routine followup  Saw ortho right chronic shoulder pain, completed PT, improved ROM and less pain, still has some issues with sleeping Rotator cuff tear. Does not want surgery She takes prn aleve, does not help that much  She does continue do to HEP  Taking amlodipine wo issues  Has cut back on smoking down to 1/2 pack a day contemplative  Lab Results  Component Value Date   CREATININE 0.82 01/12/2018   BUN 9 01/12/2018   NA 137 01/12/2018   K 4.7 01/12/2018   CL 100 01/12/2018   CO2 23 01/12/2018    Fall Risk  06/04/2018 01/12/2018 12/04/2017 12/04/2017 10/29/2017  Falls in the past year? 0 Yes No No No  Comment - x 4 days ago, hurt right shoulder - - -     Depression screen [facility] 2/9 06/04/2018 01/12/2018 12/04/2017  Decreased Interest 0 0 0  Down, Depressed, Hopeless 0 0 0  PHQ - 2 Score 0 0 0    No Known Allergies  Prior to Admission medications   Medication Sig Start Date End Date Taking? Authorizing Provider  amLODipine (NORVASC) 2.5 MG tablet TAKE 1 TABLET BY MOUTH EVERY DAY 05/28/18  Yes Rutherford Guys, MD    Past Medical History:  Diagnosis Date  . Cataract   . Essential hypertension, benign   . GERD (gastroesophageal reflux disease)   . Hyperlipidemia    no current medications  . Sleep apnea    doesnt wear cpap     Past Surgical History:  Procedure Laterality Date  . APPENDECTOMY  1968  . CHOLECYSTECTOMY  1968  . COLONOSCOPY  2004   polyps  . REDUCTION MAMMAPLASTY Bilateral   . TOE SURGERY    . TUBAL LIGATION  1968  . UPPER GASTROINTESTINAL ENDOSCOPY      Social History   Tobacco Use  . Smoking status: Current  Every Day Smoker    Packs/day: 1.00    Years: 10.00    Pack years: 10.00    Types: Cigarettes  . Smokeless tobacco: Never Used  Substance Use Topics  . Alcohol use: Yes    Alcohol/week: 6.0 standard drinks    Types: 6 Cans of beer per week    Comment: daily-     Family History  Problem Relation Age of Onset  . Hypertension Mother   . Stroke Mother   . Hypertension Father   . Cancer Father 52       Leukemia  . Early death Brother        pneumonia  . Hypertension Maternal Grandmother   . Early death Brother   . Colon polyps Sister   . Colon cancer Neg Hx   . Esophageal cancer Neg Hx   . Rectal cancer Neg Hx   . Stomach cancer Neg Hx     Review of Systems  Constitutional: Negative for chills and fever.  Respiratory: Negative for cough and shortness of breath.   Cardiovascular: Negative for chest pain, palpitations and leg swelling.  Gastrointestinal: Negative for abdominal pain, nausea and vomiting.     OBJECTIVE:  Blood  pressure 126/79, pulse 74, temperature 97.8 F (36.6 C), temperature source Oral, resp. rate 18, height 5' (1.524 m), weight 176 lb 9.6 oz (80.1 kg), last menstrual period 06/16/1997, SpO2 95 %. Body mass index is 34.49 kg/m.   Physical Exam Vitals signs and nursing note reviewed.  Constitutional:      Appearance: She is well-developed.  HENT:     Head: Normocephalic and atraumatic.  Eyes:     General: No scleral icterus.    Conjunctiva/sclera: Conjunctivae normal.     Pupils: Pupils are equal, round, and reactive to light.  Neck:     Musculoskeletal: Neck supple.  Pulmonary:     Effort: Pulmonary effort is normal.  Skin:    General: Skin is warm and dry.  Neurological:     Mental Status: She is alert and oriented to person, place, and time.     ASSESSMENT and PLAN  1. Essential hypertension, benign Controlled. Continue current regime.   2. Tobacco use disorder The patient was for counseled for > 10 minutes on the dangers of  tobacco use, and was advised to quit.  Reviewed strategies to maximize success, including removing cigarettes and smoking materials from environment, stress management, substitution of other forms of reinforcement, support of family/friends, written materials and pharmacotherapy (buproprion).  3. Chronic right shoulder pain Improved, still having pain at night interfering with sleep. Changing aleve OTC to meloxicam, discussed OTC meds, cont with HEP  Other orders - meloxicam (MOBIC) 7.5 MG tablet; Take 1 tablet (7.5 mg total) by mouth daily. - buPROPion (WELLBUTRIN SR) 150 MG 12 hr tablet; Take 1 tablet (150 mg total) by mouth 2 (two) times daily. - amLODipine (NORVASC) 2.5 MG tablet; Take 1 tablet (2.5 mg total) by mouth daily.  Return in about 6 months (around 12/04/2018) for Medicare annual wellness visit.    Rutherford Guys, MD [facility] at [facility] [place], [place] [address] Ph.  [phone] Fax [phone]

## 2018-06-04 NOTE — Patient Instructions (Addendum)
Try biofreeze spray or roll-on or salnopas patches for your shoulder Trial of meloxicam, cant do aleve with it  Think about quiting Remember to start wellbutrin (buproprion) once a day for a week, then increase to twice a day Quit after a week of being on it for twice a day    Coping with Quitting Smoking  Quitting smoking is a physical and mental challenge. You will face cravings, withdrawal symptoms, and temptation. Before quitting, work with your health care provider to make a plan that can help you cope. Preparation can help you quit and keep you from giving in. How can I cope with cravings? Cravings usually last for 5-10 minutes. If you get through it, the craving will pass. Consider taking the following actions to help you cope with cravings:  Keep your mouth busy: ? Chew sugar-free gum. ? Suck on hard candies or a straw. ? Brush your teeth.  Keep your hands and body busy: ? Immediately change to a different activity when you feel a craving. ? Squeeze or play with a ball. ? Do an activity or a hobby, like making bead jewelry, practicing needlepoint, or working with wood. ? Mix up your normal routine. ? Take a short exercise break. Go for a quick walk or run up and down stairs. ? Spend time in public places where smoking is not allowed.  Focus on doing something kind or helpful for someone else.  Call a friend or family member to talk during a craving.  Join a support group.  Call a quit line, such as 1-800-QUIT-NOW.  Talk with your health care provider about medicines that might help you cope with cravings and make quitting easier for you. How can I deal with withdrawal symptoms? Your body may experience negative effects as it tries to get used to not having nicotine in the system. These effects are called withdrawal symptoms. They may include:  Feeling hungrier than normal.  Trouble concentrating.  Irritability.  Trouble sleeping.  Feeling  depressed.  Restlessness and agitation.  Craving a cigarette. To manage withdrawal symptoms:  Avoid places, people, and activities that trigger your cravings.  Remember why you want to quit.  Get plenty of sleep.  Avoid coffee and other caffeinated drinks. These may worsen some of your symptoms. How can I handle social situations? Social situations can be difficult when you are quitting smoking, especially in the first few weeks. To manage this, you can:  Avoid parties, bars, and other social situations where people might be smoking.  Avoid alcohol.  Leave right away if you have the urge to smoke.  Explain to your family and friends that you are quitting smoking. Ask for understanding and support.  Plan activities with friends or family where smoking is not an option. What are some ways I can cope with stress? Wanting to smoke may cause stress, and stress can make you want to smoke. Find ways to manage your stress. Relaxation techniques can help. For example:  Breathe slowly and deeply, in through your nose and out through your mouth.  Listen to soothing, relaxing music.  Talk with a family member or friend about your stress.  Light a candle.  Soak in a bath or take a shower.  Think about a peaceful place. What are some ways I can prevent weight gain? Be aware that many people gain weight after they quit smoking. However, not everyone does. To keep from gaining weight, have a plan in place before you quit and  stick to the plan after you quit. Your plan should include:  Having healthy snacks. When you have a craving, it may help to: ? Eat plain popcorn, crunchy carrots, celery, or other cut vegetables. ? Chew sugar-free gum.  Changing how you eat: ? Eat small portion sizes at meals. ? Eat 4-6 small meals throughout the day instead of 1-2 large meals a day. ? Be mindful when you eat. Do not watch television or do other things that might distract you as you  eat.  Exercising regularly: ? Make time to exercise each day. If you do not have time for a long workout, do short bouts of exercise for 5-10 minutes several times a day. ? Do some form of strengthening exercise, like weight lifting, and some form of aerobic exercise, like running or swimming.  Drinking plenty of water or other low-calorie or no-calorie drinks. Drink 6-8 glasses of water daily, or as much as instructed by your health care provider. Summary  Quitting smoking is a physical and mental challenge. You will face cravings, withdrawal symptoms, and temptation to smoke again. Preparation can help you as you go through these challenges.  You can cope with cravings by keeping your mouth busy (such as by chewing gum), keeping your body and hands busy, and making calls to family, friends, or a helpline for people who want to quit smoking.  You can cope with withdrawal symptoms by avoiding places where people smoke, avoiding drinks with caffeine, and getting plenty of rest.  Ask your health care provider about the different ways to prevent weight gain, avoid stress, and handle social situations. This information is not intended to replace advice given to you by your health care provider. Make sure you discuss any questions you have with your health care provider. Document Released: 05/30/2016 Document Revised: 05/30/2016 Document Reviewed: 05/30/2016 Elsevier Interactive Patient Education  [employer].   If you have lab work done today you will be contacted with your lab results within the next 2 weeks.  If you have not heard from [place] then please contact us. The fastest way to get your results is to register for My Chart.   IF you received an x-ray today, you will receive an invoice from [facility]. Please contact [facility] at [phone] with questions or concerns regarding your invoice.   IF you received labwork today, you will receive an invoice from  [facility]. Please contact [facility] at [phone] with questions or concerns regarding your invoice.   Our billing staff will not be able to assist you with questions regarding bills from these companies.  You will be contacted with the lab results as soon as they are available. The fastest way to get your results is to activate your My Chart account. Instructions are located on the last page of this paperwork. If you have not heard from [place] regarding the results in 2 weeks, please contact this office.

## 2018-06-16 DIAGNOSIS — I739 Peripheral vascular disease, unspecified: Secondary | ICD-10-CM

## 2018-06-16 HISTORY — DX: Peripheral vascular disease, unspecified: I73.9

## 2018-07-01 ENCOUNTER — Other Ambulatory Visit: Payer: Self-pay | Admitting: Family Medicine

## 2018-07-01 NOTE — Telephone Encounter (Signed)
Requested Prescriptions  Pending Prescriptions Disp Refills  . meloxicam (MOBIC) 7.5 MG tablet [Pharmacy Med Name: MELOXICAM 7.5 MG TABLET] 30 tablet 0    Sig: TAKE 1 TABLET BY MOUTH EVERY DAY     Analgesics:  COX2 Inhibitors Passed - 07/01/2018  3:06 AM      Passed - HGB in normal range and within 360 days    Hemoglobin  Date Value Ref Range Status  12/04/2017 13.0 11.1 - 15.9 g/dL Final   Hemoglobin, fingerstick  Date Value Ref Range Status  03/31/2014 13.4 12.0 - 16.0 g/dL Final         Passed - Cr in normal range and within 360 days    Creat  Date Value Ref Range Status  04/25/2015 1.09 (H) 0.50 - 0.99 mg/dL Final   Creatinine, Ser  Date Value Ref Range Status  01/12/2018 0.82 0.57 - 1.00 mg/dL Final         Passed - Patient is not pregnant      Passed - Valid encounter within last 12 months    Recent Outpatient Visits          3 weeks ago Essential hypertension, benign   [facility], Lilia Argue, MD   5 months ago Injury of right shoulder, initial encounter   [facility] at [facility], Gelene Mink, PA-C   6 months ago Encounter for [employer] annual wellness exam   [facility], Lilia Argue, MD   8 months ago Essential hypertension, benign   [facility], Lilia Argue, MD   9 months ago Vaginal discharge   [facility], Audrie Lia, PA-C      Future Appointments            In 5 months Pamella Pert, Lilia Argue, MD Primary Care at [place], [facility]

## 2018-07-08 ENCOUNTER — Other Ambulatory Visit: Payer: Self-pay | Admitting: Family Medicine

## 2018-08-31 ENCOUNTER — Telehealth: Payer: Self-pay | Admitting: Family Medicine

## 2018-08-31 NOTE — Telephone Encounter (Signed)
Called pt left VM for her  to call and reschedule AWV and appt with Dr Pamella Pert scheduled for 06.24.2020/ appt's where  cancelled due to Schedule conflict . FR

## 2018-09-06 ENCOUNTER — Encounter: Payer: Self-pay | Admitting: Gastroenterology

## 2018-10-21 ENCOUNTER — Encounter: Payer: Self-pay | Admitting: Gastroenterology

## 2018-11-10 ENCOUNTER — Telehealth: Payer: Self-pay | Admitting: Family Medicine

## 2018-11-10 NOTE — Telephone Encounter (Signed)
Pt called to reschedule her AWV.

## 2018-12-04 DIAGNOSIS — H524 Presbyopia: Secondary | ICD-10-CM | POA: Diagnosis not present

## 2018-12-07 ENCOUNTER — Ambulatory Visit: Payer: Medicare HMO | Admitting: Family Medicine

## 2018-12-08 ENCOUNTER — Ambulatory Visit: Payer: Self-pay | Admitting: Family Medicine

## 2018-12-08 ENCOUNTER — Ambulatory Visit: Payer: Self-pay

## 2018-12-08 ENCOUNTER — Other Ambulatory Visit: Payer: Self-pay | Admitting: Pediatric Intensive Care

## 2018-12-08 DIAGNOSIS — Z20822 Contact with and (suspected) exposure to covid-19: Secondary | ICD-10-CM

## 2018-12-12 LAB — NOVEL CORONAVIRUS, NAA: SARS-CoV-2, NAA: NOT DETECTED

## 2019-01-25 ENCOUNTER — Ambulatory Visit (INDEPENDENT_AMBULATORY_CARE_PROVIDER_SITE_OTHER): Payer: Medicare HMO | Admitting: Family Medicine

## 2019-01-25 ENCOUNTER — Encounter: Payer: Self-pay | Admitting: Family Medicine

## 2019-01-25 ENCOUNTER — Other Ambulatory Visit: Payer: Self-pay

## 2019-01-25 VITALS — BP 136/77 | HR 69 | Temp 98.2°F | Ht 60.0 in | Wt 180.0 lb

## 2019-01-25 DIAGNOSIS — K219 Gastro-esophageal reflux disease without esophagitis: Secondary | ICD-10-CM | POA: Diagnosis not present

## 2019-01-25 DIAGNOSIS — I10 Essential (primary) hypertension: Secondary | ICD-10-CM | POA: Diagnosis not present

## 2019-01-25 DIAGNOSIS — Z1231 Encounter for screening mammogram for malignant neoplasm of breast: Secondary | ICD-10-CM

## 2019-01-25 DIAGNOSIS — E559 Vitamin D deficiency, unspecified: Secondary | ICD-10-CM | POA: Diagnosis not present

## 2019-01-25 DIAGNOSIS — Z8659 Personal history of other mental and behavioral disorders: Secondary | ICD-10-CM | POA: Diagnosis not present

## 2019-01-25 DIAGNOSIS — Z0001 Encounter for general adult medical examination with abnormal findings: Secondary | ICD-10-CM

## 2019-01-25 DIAGNOSIS — Z0000 Encounter for general adult medical examination without abnormal findings: Secondary | ICD-10-CM

## 2019-01-25 MED ORDER — AMLODIPINE BESYLATE 2.5 MG PO TABS: 2.5000 mg | ORAL_TABLET | Freq: Every day | ORAL | 3 refills | Status: DC

## 2019-01-25 MED ORDER — FAMOTIDINE 20 MG PO TABS: 20.0000 mg | ORAL_TABLET | Freq: Every day | ORAL | 3 refills | Status: DC

## 2019-01-25 NOTE — Progress Notes (Signed)
Presents today for [employer] Visit-Subsequent.   Date of last exam: December 04 2017  Interpreter used for this visit? no  Patient Care Team: Rutherford Guys, MD as PCP - General (Family Medicine)   Other items to address today:  1. Anxiety - GAD 7 score 6, last night was having a dream about feeling closed in a tight hallway and woke up not being able to breath, she also had bowl of spaghetti late last night, has known claustrophobia, other than last night has not had significant issues  2. HTN - takes amlodipine every day, checks bp occasionally at home, denies any concerns  3. Tob use - quit smoking June 2020, overall doing well  4. Vitamin D deficiency - supplemented last year. Not taking any supplement  Cancer Screening: Cervical: n/a Breast: mammo sept 2019, normal Colon: colonoscopy, may 2017, tubular adenomas, repeat in 3 years - patient reports receiving a letter stating that it would be postponed   Other Screening: Last screening for diabetes: June 2019 Last lipid screening: June 2019 Dexa: June 2017, normal  ADVANCE DIRECTIVES: Discussed: yes Patient desires CPR (Yes ), mechanical ventilation (Yes ), prolonged artificial support (may include mechanical ventilation, tube/PEG feeding, etc) (No ). On File: no Materials Provided: yes  Immunization status:  Immunization History  Administered Date(s) Administered   Influenza Split 06/19/2010   Influenza,inj,Quad PF,6+ Mos 02/13/2017   Pneumococcal Conjugate-13 11/24/2016   Pneumococcal Polysaccharide-23 12/04/2017   Tdap 10/26/2009     Health Maintenance Due  Topic Date Due   COLONOSCOPY  10/22/2018   INFLUENZA VACCINE  01/15/2019     Functional Status Survey: Is the patient deaf or have difficulty hearing?: No Does the patient have difficulty seeing, even when wearing glasses/contacts?: No Does the patient have difficulty concentrating, remembering, or making decisions?:  No Does the patient have difficulty walking or climbing stairs?: Yes(gets a little winded) Does the patient have difficulty dressing or bathing?: No Does the patient have difficulty doing errands alone such as visiting a doctor's office or shopping?: No   6CIT Screen 01/25/2019  What Year? 0 points  What month? 0 points  What time? 0 points  Count back from 20 0 points  Months in reverse 0 points  Repeat phrase 0 points  Total Score 0     Home Environment:  Lives with husband Denies any trip/fall hazards  Urinary Incontinence Screening:  denies  Patient Active Problem List   Diagnosis Date Noted   Elevated LDL cholesterol level 10/18/2011   Lumbar spondylosis 10/18/2011   Obesity (BMI 30-39.9) 10/18/2011   Hx of gastroesophageal reflux (GERD) 10/18/2011   Bowens disease 10/18/2011   History of colon polyps 10/18/2011     Past Medical History:  Diagnosis Date   Cataract    Essential hypertension, benign    GERD (gastroesophageal reflux disease)    Hyperlipidemia    no current medications   Sleep apnea    doesnt wear cpap      Past Surgical History:  Procedure Laterality Date   APPENDECTOMY  1968   CHOLECYSTECTOMY  1968   COLONOSCOPY  2004   polyps   REDUCTION MAMMAPLASTY Bilateral    TOE SURGERY     TUBAL LIGATION  1968   UPPER GASTROINTESTINAL ENDOSCOPY       Family History  Problem Relation Age of Onset   Hypertension Mother    Stroke Mother    Hypertension Father    Leukemia Father 71  Early death Brother        pneumonia   Hypertension Maternal Grandmother    Early death Brother    Colon polyps Sister    Colon cancer Neg Hx    Esophageal cancer Neg Hx    Rectal cancer Neg Hx    Stomach cancer Neg Hx      Social History   Socioeconomic History   Marital status: Married    Spouse name: Not on file   Number of children: Not on file   Years of education: Not on file   Highest education level: Not on  file  Occupational History   Not on file  Social Needs   Financial resource strain: Not on file   Food insecurity    Worry: Not on file    Inability: Not on file   Transportation needs    Medical: Not on file    Non-medical: Not on file  Tobacco Use   Smoking status: Current Every Day Smoker    Packs/day: 1.00    Years: 10.00    Pack years: 10.00    Types: Cigarettes   Smokeless tobacco: Never Used  Substance and Sexual Activity   Alcohol use: Yes    Alcohol/week: 6.0 standard drinks    Types: 6 Cans of beer per week    Comment: daily-    Drug use: No   Sexual activity: Yes    Partners: Male    Birth control/protection: Surgical    Comment: btl  Lifestyle   Physical activity    Days per week: Not on file    Minutes per session: Not on file   Stress: Not on file  Relationships   Social connections    Talks on phone: Not on file    Gets together: Not on file    Attends religious service: Not on file    Active member of club or organization: Not on file    Attends meetings of clubs or organizations: Not on file    Relationship status: Not on file   Intimate partner violence    Fear of current or ex partner: Not on file    Emotionally abused: Not on file    Physically abused: Not on file    Forced sexual activity: Not on file  Other Topics Concern   Not on file  Social History Narrative   Not on file     No Known Allergies   Prior to Admission medications   Medication Sig Start Date End Date Taking? Authorizing Provider  amLODipine (NORVASC) 2.5 MG tablet Take 1 tablet (2.5 mg total) by mouth daily. 06/04/18  Yes Rutherford Guys, MD     Depression screen [facility] 2/9 01/25/2019 06/04/2018 01/12/2018 12/04/2017 12/04/2017  Decreased Interest 0 0 0 0 0  Down, Depressed, Hopeless 0 0 0 0 0  PHQ - 2 Score 0 0 0 0 0     Fall Risk  01/25/2019 06/04/2018 01/12/2018 12/04/2017 12/04/2017  Falls in the past year? 0 0 Yes No No  Comment - - x 4 days ago,  hurt right shoulder - -  Number falls in past yr: 0 - - - -  Injury with Fall? 0 - - - -    Review of Systems  Constitutional: Negative for chills and fever.  Respiratory: Positive for cough (occ when she lies down). Negative for shortness of breath.   Cardiovascular: Negative for chest pain, palpitations, orthopnea, leg swelling and PND.  Gastrointestinal: Positive for heartburn.  Negative for abdominal pain, blood in stool, melena, nausea and vomiting.  Musculoskeletal: Positive for joint pain (right shoulder, chronic).  All other systems reviewed and are negative.   PHYSICAL EXAM: BP 136/77 (BP Location: Left Arm, Patient Position: Sitting, Cuff Size: Normal)    Pulse 69    Temp 98.2 F (36.8 C) (Oral)    Ht 5' (1.524 m)    Wt 180 lb (81.6 kg)    LMP 06/16/1997    SpO2 97%    BMI 35.15 kg/m    Wt Readings from Last 3 Encounters:  01/25/19 180 lb (81.6 kg)  06/04/18 176 lb 9.6 oz (80.1 kg)  04/01/18 169 lb (76.7 kg)      Hearing Screening   _0  _1  _2  _3  _4  _5  _6  _7  _8   Right ear:           Left ear:             Visual Acuity Screening   Right eye Left eye Both eyes  Without correction:     With correction: _9    Physical Exam  Constitutional: She is oriented to person, place, and time and well-developed, well-nourished, and in no distress.  HENT:  Head: Normocephalic and atraumatic.  Right Ear: Hearing, tympanic membrane, external ear and ear canal normal.  Left Ear: Hearing, tympanic membrane, external ear and ear canal normal.  Mouth/Throat: Oropharynx is clear and moist.  Perforated Left TM  Eyes: Pupils are equal, round, and reactive to light. EOM are normal. No scleral icterus.  Neck: Neck supple. No thyromegaly present.  Cardiovascular: Normal rate, regular rhythm, normal heart sounds and intact distal pulses. Exam reveals no gallop and no friction rub.  No murmur heard. Pulmonary/Chest: Effort normal and breath  sounds normal. She has no wheezes. She has no rales. Right breast exhibits no mass, no nipple discharge and no skin change. Left breast exhibits no mass, no nipple discharge and no skin change.  Chaperone present  Abdominal: Soft. Bowel sounds are normal. She exhibits no distension and no mass. There is no abdominal tenderness.  Musculoskeletal: Normal range of motion.        General: No edema.  Lymphadenopathy:    She has no cervical adenopathy.    She has no axillary adenopathy.       Right: No supraclavicular adenopathy present.       Left: No supraclavicular adenopathy present.  Neurological: She is alert and oriented to person, place, and time. She has normal reflexes. Gait normal.  Skin: Skin is warm and dry.  Psychiatric: Mood and affect normal.  Nursing note and vitals reviewed.     Education/Counseling provided regarding diet and exercise, prevention of chronic diseases, smoking/tobacco cessation, if applicable, and reviewed "Covered Medicare Preventive Services."   ASSESSMENT/PLAN:  1. Encounter for Medicare annual wellness exam  HCM reviewed/discussed. Anticipatory guidance regarding healthy weight, lifestyle and choices given. Discussed advanced directives, provided forms   2. Essential hypertension, benign Controlled. Continue current regime.  - TSH - CMP14+EGFR  3. Vitamin D deficiency discussed starting OTC supplementation of 2000 units a day  - Vitamin D, 25-hydroxy  4. Gastroesophageal reflux disease, esophagitis presence not specified Starting pepcid at bedtime, reviewed LFM, encouraged weight loss  5. History of claustrophobia Reassured patient  6. Visit for screening mammogram - MM DIGITAL SCREENING BILATERAL; Future  Other orders - amLODipine (NORVASC) 2.5 MG tablet; Take 1 tablet (2.5 mg total) by mouth daily. - famotidine (PEPCID) 20 MG tablet;  Take 1 tablet (20 mg total) by mouth at bedtime.

## 2019-01-25 NOTE — Patient Instructions (Addendum)
   If you have lab work done today you will be contacted with your lab results within the next 2 weeks.  If you have not heard from us then please contact us. The fastest way to get your results is to register for My Chart.   IF you received an x-ray today, you will receive an invoice from [facility]. Please contact [facility] at [phone] with questions or concerns regarding your invoice.   IF you received labwork today, you will receive an invoice from [facility]. Please contact [facility] at [phone] with questions or concerns regarding your invoice.   Our billing staff will not be able to assist you with questions regarding bills from these companies.  You will be contacted with the lab results as soon as they are available. The fastest way to get your results is to activate your My Chart account. Instructions are located on the last page of this paperwork. If you have not heard from us regarding the results in 2 weeks, please contact this office.     Preventive Care 65 Years and Older, Female Preventive care refers to lifestyle choices and visits with your health care provider that can promote health and wellness. This includes:  A yearly physical exam. This is also called an annual well check.  Regular dental and eye exams.  Immunizations.  Screening for certain conditions.  Healthy lifestyle choices, such as diet and exercise. What can I expect for my preventive care visit? Physical exam Your health care provider will check:  Height and weight. These may be used to calculate body mass index (BMI), which is a measurement that tells if you are at a healthy weight.  Heart rate and blood pressure.  Your skin for abnormal spots. Counseling Your health care provider may ask you questions about:  Alcohol, tobacco, and drug use.  Emotional well-being.  Home and relationship well-being.  Sexual activity.  Eating habits.  History of  falls.  Memory and ability to understand (cognition).  Work and work environment.  Pregnancy and menstrual history. What immunizations do I need?  Influenza (flu) vaccine  This is recommended every year. Tetanus, diphtheria, and pertussis (Tdap) vaccine  You may need a Td booster every 10 years. Varicella (chickenpox) vaccine  You may need this vaccine if you have not already been vaccinated. Zoster (shingles) vaccine  You may need this after age 60. Pneumococcal conjugate (PCV13) vaccine  One dose is recommended after age 65. Pneumococcal polysaccharide (PPSV23) vaccine  One dose is recommended after age 65. Measles, mumps, and rubella (MMR) vaccine  You may need at least one dose of MMR if you were born in 1957 or later. You may also need a second dose. Meningococcal conjugate (MenACWY) vaccine  You may need this if you have certain conditions. Hepatitis A vaccine  You may need this if you have certain conditions or if you travel or work in places where you may be exposed to hepatitis A. Hepatitis B vaccine  You may need this if you have certain conditions or if you travel or work in places where you may be exposed to hepatitis B. Haemophilus influenzae type b (Hib) vaccine  You may need this if you have certain conditions. You may receive vaccines as individual doses or as more than one vaccine together in one shot (combination vaccines). Talk with your health care provider about the risks and benefits of combination vaccines. What tests do I need? Blood tests  Lipid and cholesterol levels. These   may be checked every 5 years, or more frequently depending on your overall health.  Hepatitis C test.  Hepatitis B test. Screening  Lung cancer screening. You may have this screening every year starting at age 55 if you have a 30-pack-year history of smoking and currently smoke or have quit within the past 15 years.  Colorectal cancer screening. All adults should  have this screening starting at age 50 and continuing until age 75. Your health care provider may recommend screening at age 45 if you are at increased risk. You will have tests every 1-10 years, depending on your results and the type of screening test.  Diabetes screening. This is done by checking your blood sugar (glucose) after you have not eaten for a while (fasting). You may have this done every 1-3 years.  Mammogram. This may be done every 1-2 years. Talk with your health care provider about how often you should have regular mammograms.  BRCA-related cancer screening. This may be done if you have a family history of breast, ovarian, tubal, or peritoneal cancers. Other tests  Sexually transmitted disease (STD) testing.  Bone density scan. This is done to screen for osteoporosis. You may have this done starting at age 65. Follow these instructions at home: Eating and drinking  Eat a diet that includes fresh fruits and vegetables, whole grains, lean protein, and low-fat dairy products. Limit your intake of foods with high amounts of sugar, saturated fats, and salt.  Take vitamin and mineral supplements as recommended by your health care provider.  Do not drink alcohol if your health care provider tells you not to drink.  If you drink alcohol: ? Limit how much you have to 0-1 drink a day. ? Be aware of how much alcohol is in your drink. In the [place] one drink equals one 12 oz bottle of beer (355 mL), one 5 oz glass of wine (148 mL), or one 1 oz glass of hard liquor (44 mL). Lifestyle  Take daily care of your teeth and gums.  Stay active. Exercise for at least 30 minutes on 5 or more days each week.  Do not use any products that contain nicotine or tobacco, such as cigarettes, e-cigarettes, and chewing tobacco. If you need help quitting, ask your health care provider.  If you are sexually active, practice safe sex. Use a condom or other form of protection in order to prevent STIs  (sexually transmitted infections).  Talk with your health care provider about taking a low-dose aspirin or statin. What's next?  Go to your health care provider once a year for a well check visit.  Ask your health care provider how often you should have your eyes and teeth checked.  Stay up to date on all vaccines. This information is not intended to replace advice given to you by your health care provider. Make sure you discuss any questions you have with your health care provider. Document Released: 06/29/2015 Document Revised: 05/27/2018 Document Reviewed: 05/27/2018 Elsevier Patient Education  2020 Elsevier Inc.  

## 2019-01-26 LAB — CMP14+EGFR
ALT: 13 IU/L (ref 0–32)
AST: 14 IU/L (ref 0–40)
Albumin/Globulin Ratio: 1.8 (ref 1.2–2.2)
Albumin: 4.1 g/dL (ref 3.7–4.7)
Alkaline Phosphatase: 116 IU/L (ref 39–117)
BUN/Creatinine Ratio: 13 (ref 12–28)
BUN: 11 mg/dL (ref 8–27)
Bilirubin Total: 0.4 mg/dL (ref 0.0–1.2)
CO2: 25 mmol/L (ref 20–29)
Calcium: 9.5 mg/dL (ref 8.7–10.3)
Chloride: 101 mmol/L (ref 96–106)
Creatinine, Ser: 0.82 mg/dL (ref 0.57–1.00)
GFR calc Af Amer: 83 mL/min/{1.73_m2} (ref >59)
GFR calc non Af Amer: 72 mL/min/{1.73_m2} (ref >59)
Globulin, Total: 2.3 g/dL (ref 1.5–4.5)
Glucose: 98 mg/dL (ref 65–99)
Potassium: 4.9 mmol/L (ref 3.5–5.2)
Sodium: 138 mmol/L (ref 134–144)
Total Protein: 6.4 g/dL (ref 6.0–8.5)

## 2019-01-26 LAB — TSH: TSH: 3.41 u[IU]/mL (ref 0.450–4.500)

## 2019-01-26 LAB — VITAMIN D 25 HYDROXY (VIT D DEFICIENCY, FRACTURES): Vit D, 25-Hydroxy: 22.8 ng/mL — ABNORMAL LOW (ref 30.0–100.0)

## 2019-02-11 ENCOUNTER — Encounter: Payer: Self-pay | Admitting: Radiology

## 2019-02-18 ENCOUNTER — Other Ambulatory Visit: Payer: Self-pay

## 2019-02-18 ENCOUNTER — Encounter: Payer: Self-pay | Admitting: Family Medicine

## 2019-02-18 ENCOUNTER — Ambulatory Visit (INDEPENDENT_AMBULATORY_CARE_PROVIDER_SITE_OTHER): Payer: Medicare HMO | Admitting: Family Medicine

## 2019-02-18 VITALS — BP 143/80 | HR 63 | Temp 98.4°F | Ht 60.0 in | Wt 182.8 lb

## 2019-02-18 DIAGNOSIS — R635 Abnormal weight gain: Secondary | ICD-10-CM

## 2019-02-18 DIAGNOSIS — K219 Gastro-esophageal reflux disease without esophagitis: Secondary | ICD-10-CM

## 2019-02-18 DIAGNOSIS — Z8669 Personal history of other diseases of the nervous system and sense organs: Secondary | ICD-10-CM | POA: Diagnosis not present

## 2019-02-18 DIAGNOSIS — G479 Sleep disorder, unspecified: Secondary | ICD-10-CM

## 2019-02-18 DIAGNOSIS — F411 Generalized anxiety disorder: Secondary | ICD-10-CM

## 2019-02-18 MED ORDER — SERTRALINE HCL 50 MG PO TABS: 50.0000 mg | ORAL_TABLET | Freq: Every day | ORAL | 3 refills | Status: DC

## 2019-02-18 NOTE — Patient Instructions (Addendum)
   Take sertraline 50 mg 1 daily for anxiety.  It may take being on it for several weeks before you start feeling significantly better.  Continue your medicine for stomach acid reduction.  You are being referred for a sleep study.  I encourage you to get regular exercise by doing some walking or something.  Try and spend some time with your friends, at least on the phone with them.  Return in about 6 weeks to follow-up with Dr. Pamella Pert.  Come in sooner if needed.  If you have lab work done today you will be contacted with your lab results within the next 2 weeks.  If you have not heard from [place] then please contact us. The fastest way to get your results is to register for My Chart.   IF you received an x-ray today, you will receive an invoice from [facility]. Please contact [facility] at [phone] with questions or concerns regarding your invoice.   IF you received labwork today, you will receive an invoice from [facility]. Please contact [facility] at [phone] with questions or concerns regarding your invoice.   Our billing staff will not be able to assist you with questions regarding bills from these companies.  You will be contacted with the lab results as soon as they are available. The fastest way to get your results is to activate your My Chart account. Instructions are located on the last page of this paperwork. If you have not heard from [place] regarding the results in 2 weeks, please contact this office.

## 2019-02-18 NOTE — Progress Notes (Signed)
Patient ID: Alicia Parker, female    DOB: [birth year]  Age: 73 y.o. MRN: [id number]  Chief Complaint  Patient presents with  . Anxiety    coming more frequent   . Sleep Apnea    referral request    Subjective:   Patient is here with concerns about anxiety.  This been bothering her more frequently, almost every day now.  She continues to have this trouble.  She has been somewhat stressed with the COVID crisis.  She does have a stressful marriage because of her husband's PTSD, but otherwise they get along well.  She has not been able to see many of her friends, but she talks on the phone frequently.  She has 3 children and some grandchildren.  She is retired, 29 years old.  Has not been able to go to church due to [place].  Does not get exercise and has been gaining weight.  She gets some chest anxiety tightness sensation.  The reflux medication that she was recently put on helps, famotidine, but she did get a piece of steak stuck in her throat recently.  She does have a history of sleep apnea.  Has not used her machine for years.  She feels like that is getting worse with some sleep disturbance.  She would like to get retested.  Current allergies, medications, problem list, past/family and social histories reviewed.  Objective:  BP (!) 143/80 (BP Location: Right Arm, Patient Position: Sitting, Cuff Size: Normal)   Pulse 63   Temp 98.4 F (36.9 C) (Oral)   Ht 5' (1.524 m)   Wt 182 lb 12.8 oz (82.9 kg)   LMP 06/16/1997   SpO2 98%   BMI 35.70 kg/m   Chest clear.  Heart rate without murmurs.  Assessment & Plan:   Assessment: 1. Generalized anxiety disorder   2. History of sleep apnea   3. Sleep disturbance   4. Weight gain   5. Gastroesophageal reflux disease, esophagitis presence not specified       Plan: See instructions.  No orders of the defined types were placed in this encounter.   No orders of the defined types were placed in this encounter.        Patient  Instructions     Take sertraline 50 mg 1 daily for anxiety.  It may take being on it for several weeks before you start feeling significantly better.  Continue your medicine for stomach acid reduction.  You are being referred for a sleep study.  I encourage you to get regular exercise by doing some walking or something.  Try and spend some time with your friends, at least on the phone with them.  Return in about 6 weeks to follow-up with Dr. Pamella Pert.  Come in sooner if needed.  If you have lab work done today you will be contacted with your lab results within the next 2 weeks.  If you have not heard from [place] then please contact us. The fastest way to get your results is to register for My Chart.   IF you received an x-ray today, you will receive an invoice from [facility]. Please contact [facility] at [phone] with questions or concerns regarding your invoice.   IF you received labwork today, you will receive an invoice from [place]. Please contact [facility] at [phone] with questions or concerns regarding your invoice.   Our billing staff will not be able to assist you with questions regarding bills from these companies.  You will  be contacted with the lab results as soon as they are available. The fastest way to get your results is to activate your My Chart account. Instructions are located on the last page of this paperwork. If you have not heard from [place] regarding the results in 2 weeks, please contact this office.         Return in about 6 weeks (around 04/01/2019) for santiago for anxiety and sleep study follow up.   Ruben Reason, MD 02/18/2019

## 2019-03-03 ENCOUNTER — Encounter: Payer: Self-pay | Admitting: Neurology

## 2019-03-03 ENCOUNTER — Other Ambulatory Visit: Payer: Self-pay

## 2019-03-03 ENCOUNTER — Ambulatory Visit (INDEPENDENT_AMBULATORY_CARE_PROVIDER_SITE_OTHER): Payer: Medicare HMO | Admitting: Neurology

## 2019-03-03 VITALS — BP 120/70 | HR 63 | Ht 60.0 in | Wt 178.0 lb

## 2019-03-03 DIAGNOSIS — Z820 Family history of epilepsy and other diseases of the nervous system: Secondary | ICD-10-CM | POA: Diagnosis not present

## 2019-03-03 DIAGNOSIS — G4733 Obstructive sleep apnea (adult) (pediatric): Secondary | ICD-10-CM | POA: Diagnosis not present

## 2019-03-03 DIAGNOSIS — E669 Obesity, unspecified: Secondary | ICD-10-CM | POA: Diagnosis not present

## 2019-03-03 DIAGNOSIS — G4719 Other hypersomnia: Secondary | ICD-10-CM

## 2019-03-03 DIAGNOSIS — R351 Nocturia: Secondary | ICD-10-CM | POA: Diagnosis not present

## 2019-03-03 NOTE — Patient Instructions (Signed)

## 2019-03-03 NOTE — Progress Notes (Signed)
Subjective:    Patient ID: Alicia Parker is a 73 y.o. female.  HPI     Star Age, MD, PhD [facility] [address] [place], [place] [address]  Dear Dr. [name], I saw your patient, Alicia Parker, upon your kind request in my sleep clinic today for initial consultation of her sleep disorder, in particular, reevaluation of her prior diagnosis of obstructive sleep apnea.  The patient is unaccompanied today.  As you know, Alicia Parker is a 73 year old right-handed woman with an underlying medical history of reflux disease, anxiety, hypertension, hyperlipidemia, cataracts and obesity, who was previously diagnosed with obstructive sleep apnea and placed on CPAP therapy.  Prior sleep study results are not available for my review today.  She has not been using her CPAP machine in some years.  She has had some weight gain over time.  I reviewed your office note from 02/18/2019.  Her Epworth sleepiness score is 17 out of 24, fatigue severity score is 54 out of 63.  She is married and lives with her husband and daughter.  She has 3 children.  She quit smoking in June 2020.  She drinks alcohol in the form of beer, about 2/day but is cutting back, and caffeine in the form of coffee, about 1 or 1-1/2 cups/day on average.  She does not typically drink soda on a day-to-day basis or tea.  She has no pets, she has a TV in the bedroom and falls asleep with the TV on.  She is generally in bed before 9 and rise time varies, between 7 and 10.  She is retired from Scientist, research (medical) work.  She reports that her husband has PTSD and is a very restless sleeper and she is planning to separate their bedrooms as none of them are sleeping well.  She has a family history of sleep apnea and her brother who has a CPAP machine and her son.  She has 2 sons and 1 daughter.  She estimates that she has not been on CPAP therapy since 2004, she reports that her machine was from 2000.  She had difficulty with the  full facemask.  She would be willing to get retested and consider CPAP therapy again.  She denies recurrent morning headaches but has nocturia about 2 or 3 times per average night and also allergy symptoms.   Her Past Medical History Is Significant For: Past Medical History:  Diagnosis Date  . Cataract   . Essential hypertension, benign   . GERD (gastroesophageal reflux disease)   . Hyperlipidemia    no current medications  . Sleep apnea    doesnt wear cpap     Her Past Surgical History Is Significant For: Past Surgical History:  Procedure Laterality Date  . APPENDECTOMY  1968  . CHOLECYSTECTOMY  1968  . COLONOSCOPY  2004   polyps  . REDUCTION MAMMAPLASTY Bilateral   . TOE SURGERY    . TUBAL LIGATION  1968  . UPPER GASTROINTESTINAL ENDOSCOPY      Her Family History Is Significant For: Family History  Problem Relation Age of Onset  . Hypertension Mother   . Stroke Mother   . Hypertension Father   . Leukemia Father 52  . Early death Brother        pneumonia  . Hypertension Maternal Grandmother   . Early death Brother   . Colon polyps Sister   . Colon cancer Neg Hx   . Esophageal cancer Neg Hx   .  Rectal cancer Neg Hx   . Stomach cancer Neg Hx     Her Social History Is Significant For: Social History   Socioeconomic History  . Marital status: Married    Spouse name: Not on file  . Number of children: Not on file  . Years of education: Not on file  . Highest education level: Not on file  Occupational History  . Not on file  Social Needs  . Financial resource strain: Not on file  . Food insecurity    Worry: Not on file    Inability: Not on file  . Transportation needs    Medical: Not on file    Non-medical: Not on file  Tobacco Use  . Smoking status: Current Every Day Smoker    Packs/day: 1.00    Years: 10.00    Pack years: 10.00    Types: Cigarettes  . Smokeless tobacco: Never Used  Substance and Sexual Activity  . Alcohol use: Yes    Alcohol/week:  6.0 standard drinks    Types: 6 Cans of beer per week    Comment: daily-   . Drug use: No  . Sexual activity: Yes    Partners: Male    Birth control/protection: Surgical    Comment: btl  Lifestyle  . Physical activity    Days per week: Not on file    Minutes per session: Not on file  . Stress: Not on file  Relationships  . Social Herbalist on phone: Not on file    Gets together: Not on file    Attends religious service: Not on file    Active member of club or organization: Not on file    Attends meetings of clubs or organizations: Not on file    Relationship status: Not on file  Other Topics Concern  . Not on file  Social History Narrative  . Not on file    Her Allergies Are:  No Known Allergies:   Her Current Medications Are:  Outpatient Encounter Medications as of 03/03/2019  Medication Sig  . amLODipine (NORVASC) 2.5 MG tablet Take 1 tablet (2.5 mg total) by mouth daily.  . famotidine (PEPCID) 20 MG tablet Take 1 tablet (20 mg total) by mouth at bedtime.  . sertraline (ZOLOFT) 50 MG tablet Take 1 tablet (50 mg total) by mouth daily.   No facility-administered encounter medications on file as of 03/03/2019.   :  Review of Systems:  Out of a complete 14 point review of systems, all are reviewed and negative with the exception of these symptoms as listed below: Review of Systems  Neurological:       Pt presents today to discuss her sleep. Pt has had a sleep study in the past but stopped using her cpap around 2004. Pt does endorse snoring.  Epworth Sleepiness Scale 0= would never doze 1= slight chance of dozing 2= moderate chance of dozing 3= high chance of dozing  Sitting and reading: 3 Watching TV: 3 Sitting inactive in a public place (ex. Theater or meeting): 2 As a passenger in a car for an hour without a break: 1 Lying down to rest in the afternoon: 3 Sitting and talking to someone: 2 Sitting quietly after lunch (no alcohol): 3 In a car, while  stopped in traffic:0 Total: 17     Objective:  Neurological Exam  Physical Exam Physical Examination:   Vitals:   03/03/19 1058  BP: 120/70  Pulse: 63    General  Examination: The patient is a very pleasant 73 y.o. female in no acute distress. She appears well-developed and well-nourished and well groomed.   HEENT: Normocephalic, atraumatic, pupils are equal, round and reactive to light and accommodation. Corrective eyeglasses in place.  Extraocular tracking well preserved, hearing grossly intact. Face is symmetric with normal facial animation. Speech is clear with no dysarthria noted. There is no hypophonia. There is no lip, neck/head, jaw or voice tremor. Neck is supple with full range of passive and active motion. There are no carotid bruits on auscultation. Oropharynx exam reveals: mild mouth dryness, adequate dental hygiene and moderate airway crowding, due to Small airway entry and redundant soft palate, tonsils on the smaller side, uvula normal in size, tongue protrudes centrally in palate elevates symmetrically, neck circumference is 17-1/2 inches.  She has a mild overbite.  Nasal inspection shows slight deviated septum to the right but nothing obstructing or crowded.   Chest: Clear to auscultation without wheezing, rhonchi or crackles noted.  Heart: S1+S2+0, regular and normal without murmurs, rubs or gallops noted.   Abdomen: Soft, non-tender and non-distended with normal bowel sounds appreciated on auscultation.  Extremities: There is no pitting edema in the distal lower extremities bilaterally.   Skin: Warm and dry without trophic changes noted.  Musculoskeletal: exam reveals no obvious joint deformities, tenderness or joint swelling or erythema.   Neurologically:  Mental status: The patient is awake, alert and oriented in all 4 spheres. Her immediate and remote memory, attention, language skills and fund of knowledge are appropriate. There is no evidence of aphasia,  agnosia, apraxia or anomia. Speech is clear with normal prosody and enunciation. Thought process is linear. Mood is normal and affect is normal.  Cranial nerves II - XII are as described above under HEENT exam.  Motor exam: Normal bulk, strength and tone is noted. There is no tremor. Romberg is negative. Fine motor skills and coordination: grossly intact.  Cerebellar testing: No dysmetria or intention tremor. There is no truncal or gait ataxia.  Sensory exam: intact to light touch.  Gait, station and balance: She stands easily. No veering to one side is noted. No leaning to one side is noted. Posture is age-appropriate and stance is narrow based. Gait shows normal stride length and normal pace. No problems turning are noted. Tandem walk is slightly challenging for her (but appears normal for age).   Assessment and Plan:  In summary, ROGAN NOA is a very pleasant 73 y.o.-year old female with an underlying medical history of reflux disease, anxiety, hypertension, hyperlipidemia, cataracts and obesity, who Presents for reevaluation of her prior diagnosis of obstructive sleep apnea.  She reports sleep disruption, snoring, nonrestorative sleep and daytime somnolence as well as significant nocturia.  She would benefit from retesting with a sleep study. I had a long chat with the patient about my findings and the diagnosis of OSA, its prognosis and treatment options. We talked about medical treatments, surgical interventions and non-pharmacological approaches. I explained in particular the risks and ramifications of untreated moderate to severe OSA, especially with respect to developing cardiovascular disease down the Road, including congestive heart failure, difficult to treat hypertension, cardiac arrhythmias, or stroke. Even type 2 diabetes has, in part, been linked to untreated OSA. Symptoms of untreated OSA include daytime sleepiness, memory problems, mood irritability and mood disorder such as  depression and anxiety, lack of energy, as well as recurrent headaches, especially morning headaches. We talked about ongoing smoking cessation and trying to maintain a  healthy lifestyle in general, as well as the importance of weight control. I encouraged the patient to eat healthy, exercise daily and keep well hydrated, to keep a scheduled bedtime and wake time routine, to not skip any meals and eat healthy snacks in between meals. I advised the patient not to drive when feeling sleepy. I recommended the following at this time: sleep study.   I explained the sleep test procedure to the patient and also outlined possible surgical and non-surgical treatment options of OSA, including the use of a custom-made dental device (which would require a referral to a specialist dentist or oral surgeon), upper airway surgical options, such as pillar implants, radiofrequency surgery, tongue base surgery, and UPPP (which would involve a referral to an ENT surgeon).  I also explained the CPAP treatment option to the patient, who indicated that she would be willing to try CPAP if the need arises. I explained the importance of being compliant with PAP treatment, not only for insurance purposes but primarily to improve Her symptoms, and for the patient's long term health benefit, including to reduce Her cardiovascular risks. I answered all her questions today and the patient was in agreement. I plan to see her back after the sleep study is completed and encouraged her to call with any interim questions, concerns, problems or updates.   Thank you very much for allowing me to participate in the care of this nice patient. If I can be of any further assistance to you please do not hesitate to call me at [phone].  Sincerely,   Star Age, MD, PhD

## 2019-03-12 ENCOUNTER — Other Ambulatory Visit: Payer: Self-pay | Admitting: Family Medicine

## 2019-03-12 DIAGNOSIS — F411 Generalized anxiety disorder: Secondary | ICD-10-CM

## 2019-03-13 NOTE — Telephone Encounter (Signed)
Requested medication (s) are due for refill today: yes  Requested medication (s) are on the active medication list: yes  Last refill:  02/18/2019  Future visit scheduled: yes  Notes to clinic:  Requesting 90 day supply   Requested Prescriptions  Pending Prescriptions Disp Refills   sertraline (ZOLOFT) 50 MG tablet [Pharmacy Med Name: SERTRALINE HCL 50 MG TABLET] 90 tablet 2    Sig: TAKE 1 TABLET BY [place]     Psychiatry:  Antidepressants - SSRI Failed - 03/12/2019 12:31 PM      Failed - Completed PHQ-2 or PHQ-9 in the last 360 days.      Passed - Valid encounter within last 6 months    Recent Outpatient Visits          3 weeks ago Generalized anxiety disorder   [facility] at [facility], Fenton Malling, MD   1 month ago Encounter for Medicare annual wellness exam   [facility] at [facility], Lilia Argue, MD   9 months ago Essential hypertension, benign   [facility] at [facility], Lilia Arg[facility], MD   1 year ago Injury of right shoulder, initial encounter   Primary Care at [facility], Gelene Mink, PA-C   1 year ago Encounter for [employer] annual wellness exam   [facility] at [facility], Lilia Argue, MD      Future Appointments            In 2 weeks Rutherford Guys, MD Primary Care at [place], [facility]   In 4 months Rutherford Guys, MD Primary Care at [place], [facility]

## 2019-03-14 ENCOUNTER — Ambulatory Visit: Payer: Medicare HMO | Admitting: Neurology

## 2019-03-28 ENCOUNTER — Ambulatory Visit (INDEPENDENT_AMBULATORY_CARE_PROVIDER_SITE_OTHER): Payer: Medicare HMO | Admitting: Neurology

## 2019-03-28 ENCOUNTER — Other Ambulatory Visit: Payer: Self-pay

## 2019-03-28 DIAGNOSIS — R351 Nocturia: Secondary | ICD-10-CM

## 2019-03-28 DIAGNOSIS — G4733 Obstructive sleep apnea (adult) (pediatric): Secondary | ICD-10-CM | POA: Diagnosis not present

## 2019-03-28 DIAGNOSIS — E669 Obesity, unspecified: Secondary | ICD-10-CM

## 2019-03-28 DIAGNOSIS — G4719 Other hypersomnia: Secondary | ICD-10-CM

## 2019-03-28 DIAGNOSIS — Z820 Family history of epilepsy and other diseases of the nervous system: Secondary | ICD-10-CM

## 2019-04-01 ENCOUNTER — Other Ambulatory Visit: Payer: Self-pay

## 2019-04-01 ENCOUNTER — Ambulatory Visit (INDEPENDENT_AMBULATORY_CARE_PROVIDER_SITE_OTHER): Payer: Medicare HMO | Admitting: Family Medicine

## 2019-04-01 ENCOUNTER — Encounter: Payer: Self-pay | Admitting: Family Medicine

## 2019-04-01 VITALS — BP 120/60 | HR 66 | Temp 98.4°F | Ht 60.0 in | Wt 180.0 lb

## 2019-04-01 DIAGNOSIS — I739 Peripheral vascular disease, unspecified: Secondary | ICD-10-CM

## 2019-04-01 DIAGNOSIS — R6889 Other general symptoms and signs: Secondary | ICD-10-CM

## 2019-04-01 DIAGNOSIS — I10 Essential (primary) hypertension: Secondary | ICD-10-CM | POA: Diagnosis not present

## 2019-04-01 DIAGNOSIS — R0989 Other specified symptoms and signs involving the circulatory and respiratory systems: Secondary | ICD-10-CM | POA: Diagnosis not present

## 2019-04-01 DIAGNOSIS — F411 Generalized anxiety disorder: Secondary | ICD-10-CM

## 2019-04-01 MED ORDER — SERTRALINE HCL 50 MG PO TABS: 75.0000 mg | ORAL_TABLET | Freq: Every day | ORAL | 0 refills | Status: DC

## 2019-04-01 NOTE — Patient Instructions (Signed)
° ° ° °  If you have lab work done today you will be contacted with your lab results within the next 2 weeks.  If you have not heard from us then please contact us. The fastest way to get your results is to register for My Chart. ° ° °IF you received an x-ray today, you will receive an invoice from [facility]. Please contact  Radiology at [phone] with questions or concerns regarding your invoice.  ° °IF you received labwork today, you will receive an invoice from [facility]. Please contact [facility] at [phone] with questions or concerns regarding your invoice.  ° °Our billing staff will not be able to assist you with questions regarding bills from these companies. ° °You will be contacted with the lab results as soon as they are available. The fastest way to get your results is to activate your My Chart account. Instructions are located on the last page of this paperwork. If you have not heard from us regarding the results in 2 weeks, please contact this office. °  ° ° ° °

## 2019-04-01 NOTE — Progress Notes (Signed)
10/16/20208:51 AM  Fuller Plan 01-Dec-1945, 73 y.o., female [id number]  Chief Complaint  Patient presents with   Anxiety   Follow-up    sleepy study, she has an appt to go on Monday, she forgot to start the machine before bed    HPI:   Patient is a 73 y.o. female with past medical history significant for HTN, GAD, GERD, colonic polyps, OSA  who presents today for routine followup  Last OV Sept 2020 with Dr [name] - for worsening anxiety, started on sertraline, and referral to sleep -sleeping study results pending  She is tolerating sertraline well Panic attacks and anxiety improved but not at goal  She has been having calf tires/sore with walking for past several months, rest resolves quickly, she can walk 10 minutes before pain starts, trying to walk more, has not been able to extend walk time Former smoker, quit June 2020  Has had flu vaccine and shingles #1 vaccine at pharmacy this sept She will be scheduling her mammogram appt  GAD 7 : Generalized Anxiety Score 04/01/2019 02/18/2019  Nervous, Anxious, on Edge 1 3  Control/stop worrying 2 2  Worry too much - different things 2 2  Trouble relaxing 2 2  Restless 0 2  Easily annoyed or irritable 1 3  Afraid - awful might happen 2 3  Total GAD 7 Score 10 17  Anxiety Difficulty Not difficult at all Somewhat difficult     Depression screen [facility] 2/9 04/01/2019 02/18/2019 01/25/2019  Decreased Interest 0 2 0  Down, Depressed, Hopeless 0 1 0  PHQ - 2 Score 0 3 0  Altered sleeping - 2 -  Tired, decreased energy - 3 -  Change in appetite - 1 -  Feeling bad or failure about yourself  - 0 -  Trouble concentrating - 0 -  Moving slowly or fidgety/restless - 1 -  Suicidal thoughts - 0 -  PHQ-9 Score - 10 -    Fall Risk  04/01/2019 02/18/2019 01/25/2019 06/04/2018 01/12/2018  Falls in the past year? 0 0 0 0 Yes  Comment - - - - x 4 days ago, hurt right shoulder  Number falls in past yr: 0 0 0 - -  Injury with Fall? 0 0 0 - -   Follow up - Falls evaluation completed - - -     No Known Allergies  Prior to Admission medications   Medication Sig Start Date End Date Taking? Authorizing Provider  amLODipine (NORVASC) 2.5 MG tablet Take 1 tablet (2.5 mg total) by mouth daily. 01/25/19  Yes Rutherford Guys, MD  famotidine (PEPCID) 20 MG tablet Take 1 tablet (20 mg total) by mouth at bedtime. 01/25/19  Yes Rutherford Guys, MD  sertraline (ZOLOFT) 50 MG tablet TAKE 1 TABLET BY MOUTH EVERY DAY 03/15/19  Yes Rutherford Guys, MD    Past Medical History:  Diagnosis Date   Cataract    Essential hypertension, benign    GERD (gastroesophageal reflux disease)    Hyperlipidemia    no current medications   Sleep apnea    doesnt wear cpap     Past Surgical History:  Procedure Laterality Date   [place]   COLONOSCOPY  2004   polyps   REDUCTION MAMMAPLASTY Bilateral    TOE SURGERY     TUBAL LIGATION  1968   UPPER GASTROINTESTINAL ENDOSCOPY      Social History   Tobacco Use   Smoking  status: Current Every Day Smoker    Packs/day: 1.00    Years: 10.00    Pack years: 10.00    Types: Cigarettes   Smokeless tobacco: Never Used  Substance Use Topics   Alcohol use: Yes    Alcohol/week: 6.0 standard drinks    Types: 6 Cans of beer per week    Comment: daily-     Family History  Problem Relation Age of Onset   Hypertension Mother    Stroke Mother    Hypertension Father    Leukemia Father 30   Early death Brother        pneumonia   Hypertension Maternal Grandmother    Early death Brother    Colon polyps Sister    Colon cancer Neg Hx    Esophageal cancer Neg Hx    Rectal cancer Neg Hx    Stomach cancer Neg Hx     Review of Systems  Constitutional: Negative for chills and fever.  Respiratory: Negative for cough and shortness of breath.   Cardiovascular: Negative for chest pain, palpitations and leg swelling.  Gastrointestinal: Negative for  abdominal pain, nausea and vomiting.     OBJECTIVE:  Today's Vitals   04/01/19 0839  BP: 120/60  Pulse: 66  Temp: 98.4 F (36.9 C)  SpO2: 97%  Weight: 180 lb (81.6 kg)  Height: 5' (1.524 m)   Body mass index is 35.15 kg/m.   Physical Exam Vitals signs and nursing note reviewed.  Constitutional:      Appearance: She is well-developed.  HENT:     Head: Normocephalic and atraumatic.     Mouth/Throat:     Pharynx: No oropharyngeal exudate.  Eyes:     General: No scleral icterus.    Conjunctiva/sclera: Conjunctivae normal.     Pupils: Pupils are equal, round, and reactive to light.  Neck:     Musculoskeletal: Neck supple.  Cardiovascular:     Rate and Rhythm: Normal rate and regular rhythm.     Pulses: Decreased pulses.     Heart sounds: Normal heart sounds. No murmur. No friction rub. No gallop.   Pulmonary:     Effort: Pulmonary effort is normal.     Breath sounds: Normal breath sounds. No wheezing, rhonchi or rales.  Musculoskeletal:     Right lower leg: No edema.     Left lower leg: No edema.  Skin:    General: Skin is warm and dry.  Neurological:     Mental Status: She is alert and oriented to person, place, and time.     No results found for this or any previous visit (from the past 24 hour(s)).  No results found.   ASSESSMENT and PLAN  1. Generalized anxiety disorder Improved. Increasing sertraline. Cont with LFM - sertraline (ZOLOFT) 50 MG tablet; Take 1.5 tablets (75 mg total) by mouth daily.  2. Essential hypertension, benign Controlled. Continue current regime.   3. Claudication of calf muscles ([place]) 4. Decreased pedal pulses Cont with gradual exercise program, consider statin and referral per ABI results - VAS [place] ABI WITH/WO TBI; Future  Return in about 6 weeks (around 05/13/2019) for anxiety, calf pain.    Rutherford Guys, MD Primary Care at [facility], [place] [address] Ph.  [phone] Fax [phone]

## 2019-04-05 ENCOUNTER — Telehealth: Payer: Self-pay

## 2019-04-05 NOTE — Procedures (Signed)
Sleep Study Report   Patient Information     First Name: Alicia Last Name: Parker ID: [id number]  Birth Date: Nov 12, 1945 Age: 73 Gender: Female  Referring Provider: Dr. [name] BMI: 35.1 (W=178 lb, H=5' 0'')  Neck Circ.:  43 '' Epworth:  17/24  Sleep Study Information    Study Date:  Mar 28, 2019, repeat from 03/14/19 S/H/A Version: [id number] / 4.1.1528 / 76  History:    73 year old woman with a history of reflux disease, anxiety, hypertension, hyperlipidemia, cataracts and obesity, who was previously diagnosed with obstructive sleep apnea and placed on CPAP therapy. She has not used CPAP in years.  Summary & Diagnosis:    Severe OSA  Recommendations:      This home sleep test demonstrates severe obstructive sleep apnea with a total AHI of 87.3/hour and O2 nadir of 79%. Given the patient's medical history and sleep related complaints, treatment with positive airway pressure (in the form of CPAP) is recommended. This will require a full night CPAP titration study for proper treatment settings, O2 monitoring and mask fitting. Based on the severity of the sleep disordered breathing an attended titration study is indicated. However, patient's insurance has denied an attended sleep study; therefore, the patient will be advised to proceed with an autoPAP titration/trial at home for now. Please note that untreated obstructive sleep apnea may carry additional perioperative morbidity. Patients with significant obstructive sleep apnea should receive perioperative PAP therapy and the surgeons and particularly the anesthesiologist should be informed of the diagnosis and the severity of the sleep disordered breathing. The patient should be cautioned not to drive, work at heights, or operate dangerous or heavy equipment when tired or sleepy. Review and reiteration of good sleep hygiene measures should be pursued with any patient. Other causes of the patient's symptoms, including circadian rhythm  disturbances, an underlying mood disorder, medication effect and/or an underlying medical problem cannot be ruled out based on this test. Clinical correlation is recommended. The patient and her referring provider will be notified of the test results. The patient will be seen in follow up in sleep clinic at [facility].  I certify that I have reviewed the raw data recording prior to the issuance of this report in accordance with the standards of the American Academy of Sleep Medicine (AASM).  Star Age, MD, PhD [facility]) Diplomat, ABPN (Neurology and Sleep)             Sleep Summary    Oxygen Saturation Statistics     Start Study Time: End Study Time: Total Recording Time:  10:32:00 PM   6:52:40 AM   8 h, 20 min  Total Sleep Time % REM of Sleep Time:  7 h, 32 min  5.3    Mean: 93 Minimum: 79 Maximum: 98  Mean of Desaturations Nadirs (%):   91  Oxygen Desaturation. %:   4-9 10-20 >20 Total  Events Number Total   552  7 98.7 1.3  0 0.0  559 100.0  Oxygen Saturation: <90 <=88 <85 <80 <70  Duration (minutes): Sleep % 7.7 1.7  2.7 0.3  0.6 0.1 0.1 0.0 0.0 0.0     Respiratory Indices      Total Events REM NREM All Night  pRDI:  648  pAHI:  647 ODI:  559  pAHIc:  44  % CSR: 0.0 N/A N/A N/A N/A N/A N/A N/A N/A 87.5 87.3 75.5 5.9       Pulse Rate  Statistics during Sleep (BPM)      Mean:  61 Minimum: 45 Maximum: 96    Indices are calculated using technically valid sleep time of  7 hrs, 24 min. Sleep Study Report    pRDI/pAHI are calculated using oxi desaturations = 3% REM/NREM indices appear only if REM time >  30 min.   Body Position Statistics  Position Supine Prone Right Left Non-Supine  Sleep (min) 431.0 0.0 2.0 19.7 21.7  Sleep % 95.2 0.0 0.4 4.3 4.8  pRDI 88.0 N/A N/A 73.5 77.2  pAHI 87.8 N/A N/A 73.5 77.2  ODI 76.2 N/A N/A 54.4 59.4            Left   Right  Supine    Snoring Statistics Snoring  Level (dB) >40 >50 >60 >70 >80 >Threshold (45)  Sleep (min) 336.9 148.2 3.5 0.0 0.0 253.3  Sleep % 74.4 32.7 0.8 0.0 0.0 56.0    Mean: 47 dB Sleep Study Report   Sleep Stages Chart                         Wake  Sleep      Wake  9.59  %    Sleep  90.41  %   Total:  %  100.00                                                          REM  Light  Deep      REM  5.30  %    Light  90.83  %    Deep  %  3.87   Total:  100.00  %                                 Sleep/Wake States  Sleep Stages  Sleep Latency (min):  REM Latency (min):  Number of Wakes:   21   170   17                                                                               pAHI=87.3                                                                                               Mild              Moderate                    Severe  5              15                    30 °

## 2019-04-05 NOTE — Progress Notes (Signed)
Patient referred by Dr. [name], seen by me on 03/03/19 for re-eval of her prior Dx of OSA, HST on 03/28/19, which was repeated from 03/14/19 d/t no data on the 1st one.    Please call and notify the patient that the recent home sleep test showed obstructive sleep apnea in the severe range. While I recommend treatment for this in the form CPAP, her insurance will not approve a sleep study for this. They will likely only approve a trial of autoPAP, which means, that we don't have to bring her in for a sleep study with CPAP, but will let her start using an autoPAP machine at home, through a DME company (of her choice, or as per insurance requirement). The DME representative will educate her on how to use the machine, how to put the mask on, etc. I have placed an order in the chart. Please send referral, talk to patient, send report to referring MD. We will need a FU in sleep clinic for 10 weeks post-PAP set up, please arrange that with me or one of our NPs. Thanks,   Alicia Age, MD, PhD [facility])

## 2019-04-05 NOTE — Telephone Encounter (Signed)
I called pt. I advised pt that Dr. Rexene Alberts reviewed their sleep study results and found that pt has severe osa. Dr. Rexene Alberts recommends that pt start an auto pap at home. I reviewed PAP compliance expectations with the pt. Pt is agreeable to starting an auto-PAP. I advised pt that an order will be sent to a DME, [facility], and [facility] will call the pt within about one week after they file with the pt's insurance. [facility] will show the pt how to use the machine, fit for masks, and troubleshoot the auto-PAP if needed. A follow up appt was made for insurance purposes with Amy, NP on 06/23/19 at 1:30pm. Pt verbalized understanding to arrive 15 minutes early and bring their auto-PAP. A letter with all of this information in it will be mailed to the pt as a reminder. I verified with the pt that the address we have on file is correct. Pt verbalized understanding of results. Pt had no questions at this time but was encouraged to call back if questions arise. I have sent the order to [facility] and have received confirmation that they have received the order.

## 2019-04-05 NOTE — Telephone Encounter (Signed)
-----   Message from Star Age, MD sent at 04/05/2019  8:40 AM EDT ----- Patient referred by Dr. [name], seen by me on 03/03/19 for re-eval of her prior Dx of OSA, HST on 03/28/19, which was repeated from 03/14/19 d/t no data on the 1st one.    Please call and notify the patient that the recent home sleep test showed obstructive sleep apnea in the severe range. While I recommend treatment for this in the form CPAP, her insurance will not approve a sleep study for this. They will likely only approve a trial of autoPAP, which means, that we don't have to bring her in for a sleep study with CPAP, but will let her start using an autoPAP machine at home, through a DME company (of her choice, or as per insurance requirement). The DME representative will educate her on how to use the machine, how to put the mask on, etc. I have placed an order in the chart. Please send referral, talk to patient, send report to referring MD. We will need a FU in sleep clinic for 10 weeks post-PAP set up, please arrange that with me or one of our NPs. Thanks,   Star Age, MD, PhD [facility])

## 2019-04-05 NOTE — Telephone Encounter (Signed)
I called pt to discuss her sleep study results. No answer, left a message asking her to call me back. 

## 2019-04-06 ENCOUNTER — Other Ambulatory Visit (HOSPITAL_COMMUNITY): Payer: Self-pay | Admitting: Family Medicine

## 2019-04-06 DIAGNOSIS — I739 Peripheral vascular disease, unspecified: Secondary | ICD-10-CM

## 2019-04-11 ENCOUNTER — Telehealth: Payer: Self-pay | Admitting: Family Medicine

## 2019-04-11 NOTE — Telephone Encounter (Signed)
Pt called about receiving her increased dose of sertraline (ZOLOFT) 50 MG tablet It should be increased to 75mg  and sent to pharmacy/ please advise

## 2019-04-14 ENCOUNTER — Other Ambulatory Visit: Payer: Self-pay

## 2019-04-14 ENCOUNTER — Other Ambulatory Visit (HOSPITAL_COMMUNITY): Payer: Self-pay | Admitting: Family Medicine

## 2019-04-14 ENCOUNTER — Ambulatory Visit (HOSPITAL_COMMUNITY)
Admission: RE | Admit: 2019-04-14 | Discharge: 2019-04-14 | Disposition: A | Discharge status: Home / Self Care | Payer: Medicare HMO | Source: Ambulatory Visit | Attending: Cardiology | Admitting: Cardiology

## 2019-04-14 DIAGNOSIS — I739 Peripheral vascular disease, unspecified: Secondary | ICD-10-CM

## 2019-04-14 DIAGNOSIS — R0989 Other specified symptoms and signs involving the circulatory and respiratory systems: Secondary | ICD-10-CM

## 2019-04-15 NOTE — Addendum Note (Signed)
Addended by: Rutherford Guys on: 04/15/2019 12:59 PM   Modules accepted: Orders

## 2019-04-18 DIAGNOSIS — G4733 Obstructive sleep apnea (adult) (pediatric): Secondary | ICD-10-CM | POA: Diagnosis not present

## 2019-04-26 ENCOUNTER — Other Ambulatory Visit: Payer: Self-pay

## 2019-04-26 ENCOUNTER — Ambulatory Visit (INDEPENDENT_AMBULATORY_CARE_PROVIDER_SITE_OTHER): Payer: Medicare HMO | Admitting: Cardiovascular Disease

## 2019-04-26 ENCOUNTER — Encounter: Payer: Self-pay | Admitting: Cardiovascular Disease

## 2019-04-26 DIAGNOSIS — I10 Essential (primary) hypertension: Secondary | ICD-10-CM | POA: Diagnosis not present

## 2019-04-26 DIAGNOSIS — I739 Peripheral vascular disease, unspecified: Secondary | ICD-10-CM

## 2019-04-26 MED ORDER — SODIUM CHLORIDE 0.9% FLUSH: 3.0000 mL | Freq: Two times a day (BID) | INTRAVENOUS | Status: DC

## 2019-04-26 NOTE — Patient Instructions (Addendum)
Medication Instructions:  Your physician recommends that you continue on your current medications as directed. Please refer to the Current Medication list given to you today.  If you need a refill on your cardiac medications before your next appointment, please call your pharmacy.   Lab work: CBC, BMET If you have labs (blood work) drawn today and your tests are completely normal, you will receive your results only by: [place] (if you have MyChart) OR A paper copy in the mail If you have any lab test that is abnormal or we need to change your treatment, we will call you to review the results.  Testing/Procedures: Lower Extremity Dopplers with ABI after Procedure on 11/30.  Follow-Up: At [facility], you and your health needs are our priority.  As part of our continuing mission to provide you with exceptional heart care, we have created designated Provider Care Teams.  These Care Teams include your primary Cardiologist (physician) and Advanced Practice Providers (APPs -  Physician Assistants and Nurse Practitioners) who all work together to provide you with the care you need, when you need it. You may see Dr Gwenlyn Found or one of the following Advanced Practice Providers on your designated Care Team:    Kerin Ransom, PA-C  [place], [place]  Coletta Memos, [place]   Your physician wants you to follow-up after your procedure on November 30th.    Any Other Special Instructions Will Be Listed Below (If Applicable).    [address] [place] [address] Dept: [phone] Loc: [place]  04/26/2019  You are scheduled for a Peripheral Angiogram on Monday, November 30 with Dr. Quay Burow.  1. Please arrive at the [facility] (Main Entrance A) at [facility]: [address] [place], [place] [address] at 5:30 AM (This time is two hours before your  procedure to ensure your preparation). Free valet parking service is available.   Special note: Every effort is made to have your procedure done on time. Please understand that emergencies sometimes delay scheduled procedures.  2. Diet: Do not eat solid foods after midnight.  The patient may have clear liquids until 5am upon the day of the procedure.  3. Labs: You will need to have blood drawn today.  4. Medication instructions in preparation for your procedure:   Contrast Allergy: No  Start taking baby aspirin daily.    On the morning of your procedure, take your Aspirin and any morning medicines NOT listed above.  You may use sips of water.  5. Plan for one night stay--bring personal belongings. 6. Bring a current list of your medications and current insurance cards. 7. You MUST have a responsible person to drive you home. 8. Someone MUST be with you the first 24 hours after you arrive home or your discharge will be delayed. 9. Please wear clothes that are easy to get on and off and wear slip-on shoes.  Thank you for allowing [place] to care for you!   -- [place] [facility]  You will have to have a Covid test on Friday, November 27th @ 3:05pm @ the [employer]. [address] [place]

## 2019-04-26 NOTE — Progress Notes (Signed)
04/26/2019 TEYANNA GARCIAGONZALEZ   1945/07/26  [id number]  Primary Physician Rutherford Guys, MD Primary Cardiologist: Lorretta Harp MD Garret Reddish, [place], [place]  HPI:  SRUTHI UMBLE is a 73 y.o. moderately overweight married African-American female mother of 3 children, grandmother of 52 grandchildren referred by Dr. Pamella Pert for evaluation treatment of symptomatic PAD.  She is retired from working at [employer] in [employer].  Risk factors include 30 to 50 pack years of tobacco abuse having recently quit 11/18/2018.  She does have treated hypertension on low-dose amlodipine.  She also has obstructive sleep apnea on CPAP which is recently started.  There is no family history of heart disease.  She is never had a heart attack or stroke.  She denies chest pain or shortness of breath.  She has noticed bilateral lower extremity claudication which is fairly symmetric over the last year with recent lower extremity Dopplers performed 04/14/2019 revealing a right ABI 1.92 and a left ABI of 0.54 with a high-frequency signal in her left SFA.   Current Meds  Medication Sig  . amLODipine (NORVASC) 2.5 MG tablet Take 1 tablet (2.5 mg total) by mouth daily.  . famotidine (PEPCID) 20 MG tablet Take 1 tablet (20 mg total) by mouth at bedtime.  . sertraline (ZOLOFT) 50 MG tablet Take 1.5 tablets (75 mg total) by mouth daily.     No Known Allergies  Social History   Socioeconomic History  . Marital status: Married    Spouse name: Not on file  . Number of children: Not on file  . Years of education: Not on file  . Highest education level: Not on file  Occupational History  . Not on file  Social Needs  . Financial resource strain: Not on file  . Food insecurity    Worry: Not on file    Inability: Not on file  . Transportation needs    Medical: Not on file    Non-medical: Not on file  Tobacco Use  . Smoking status: Former Smoker    Packs/day: 1.00    Years: 10.00    Pack years: 10.00   Types: Cigarettes    Quit date: 11/2018    Years since quitting: 0.4  . Smokeless tobacco: Never Used  Substance and Sexual Activity  . Alcohol use: Yes    Alcohol/week: 6.0 standard drinks    Types: 6 Cans of beer per week    Comment: daily-   . Drug use: No  . Sexual activity: Yes    Partners: Male    Birth control/protection: Surgical    Comment: btl  Lifestyle  . Physical activity    Days per week: Not on file    Minutes per session: Not on file  . Stress: Not on file  Relationships  . Social Herbalist on phone: Not on file    Gets together: Not on file    Attends religious service: Not on file    Active member of club or organization: Not on file    Attends meetings of clubs or organizations: Not on file    Relationship status: Not on file  . Intimate partner violence    Fear of current or ex partner: Not on file    Emotionally abused: Not on file    Physically abused: Not on file    Forced sexual activity: Not on file  Other Topics Concern  . Not on file  Social History Narrative  .  Not on file     Review of Systems: General: negative for chills, fever, night sweats or weight changes.  Cardiovascular: negative for chest pain, dyspnea on exertion, edema, orthopnea, palpitations, paroxysmal nocturnal dyspnea or shortness of breath Dermatological: negative for rash Respiratory: negative for cough or wheezing Urologic: negative for hematuria Abdominal: negative for nausea, vomiting, diarrhea, bright red blood per rectum, melena, or hematemesis Neurologic: negative for visual changes, syncope, or dizziness All other systems reviewed and are otherwise negative except as noted above.    Blood pressure 128/76, pulse (!) 59, temperature (!) 96.8 F (36 C), height 5' (1.524 m), weight 183 lb (83 kg), last menstrual period 06/16/1997.  General appearance: alert and no distress Neck: no adenopathy, no carotid bruit, no JVD, supple, symmetrical, trachea  midline and thyroid not enlarged, symmetric, no tenderness/mass/nodules Lungs: clear to auscultation bilaterally Heart: regular rate and rhythm, S1, S2 normal, no murmur, click, rub or gallop Extremities: extremities normal, atraumatic, no cyanosis or edema Pulses: 2+ and symmetric Skin: Skin color, texture, turgor normal. No rashes or lesions Neurologic: Alert and oriented X 3, normal strength and tone. Normal symmetric reflexes. Normal coordination and gait  EKG sinus bradycardia 59 without ST or T wave changes.  Personally reviewed this EKG.  ASSESSMENT AND PLAN:   Peripheral arterial disease ([place]) Ms. Trunnell was referred to me by Dr. Pamella Pert for evaluation treatment of symptomatic PAD.  She does have bilateral calf claudication with ambulation of the last year with positive risk factors including tobacco abuse, and treated hypertension.  She has had lower extremity arterial Doppler studies performed in our office 04/14/2019 revealing a right ABI of 0.92 a left ABI of 0.54 with a high-frequency signal in her left SFA.  She she wants to proceed with angiography and potential endovascular therapy for lifestyle limiting claudication.  Essential hypertension History of essential potential blood pressure measured today 128/76.  She is on low-dose amlodipine.      Lorretta Harp MD FACP,FACC,FAHA, [facility] 04/26/2019 11:48 AM

## 2019-04-26 NOTE — Assessment & Plan Note (Signed)
History of essential potential blood pressure measured today 128/76.  She is on low-dose amlodipine.

## 2019-04-26 NOTE — H&P (View-Only) (Signed)
04/26/2019 Alicia Parker   1945-07-09  [id number]  Primary Physician Rutherford Guys, MD Primary Cardiologist: Lorretta Harp MD Garret Reddish, [place], [place]  HPI:  Alicia Parker is a 73 y.o. moderately overweight married African-American female mother of 3 children, grandmother of 1 grandchildren referred by Dr. Pamella Pert for evaluation treatment of symptomatic PAD.  She is retired from working at [employer] in [employer].  Risk factors include 30 to 50 pack years of tobacco abuse having recently quit 11/18/2018.  She does have treated hypertension on low-dose amlodipine.  She also has obstructive sleep apnea on CPAP which is recently started.  There is no family history of heart disease.  She is never had a heart attack or stroke.  She denies chest pain or shortness of breath.  She has noticed bilateral lower extremity claudication which is fairly symmetric over the last year with recent lower extremity Dopplers performed 04/14/2019 revealing a right ABI 1.92 and a left ABI of 0.54 with a high-frequency signal in her left SFA.   Current Meds  Medication Sig  . amLODipine (NORVASC) 2.5 MG tablet Take 1 tablet (2.5 mg total) by mouth daily.  . famotidine (PEPCID) 20 MG tablet Take 1 tablet (20 mg total) by mouth at bedtime.  . sertraline (ZOLOFT) 50 MG tablet Take 1.5 tablets (75 mg total) by mouth daily.     No Known Allergies  Social History   Socioeconomic History  . Marital status: Married    Spouse name: Not on file  . Number of children: Not on file  . Years of education: Not on file  . Highest education level: Not on file  Occupational History  . Not on file  Social Needs  . Financial resource strain: Not on file  . Food insecurity    Worry: Not on file    Inability: Not on file  . Transportation needs    Medical: Not on file    Non-medical: Not on file  Tobacco Use  . Smoking status: Former Smoker    Packs/day: 1.00    Years: 10.00    Pack years: 10.00   Types: Cigarettes    Quit date: 11/2018    Years since quitting: 0.4  . Smokeless tobacco: Never Used  Substance and Sexual Activity  . Alcohol use: Yes    Alcohol/week: 6.0 standard drinks    Types: 6 Cans of beer per week    Comment: daily-   . Drug use: No  . Sexual activity: Yes    Partners: Male    Birth control/protection: Surgical    Comment: btl  Lifestyle  . Physical activity    Days per week: Not on file    Minutes per session: Not on file  . Stress: Not on file  Relationships  . Social Herbalist on phone: Not on file    Gets together: Not on file    Attends religious service: Not on file    Active member of club or organization: Not on file    Attends meetings of clubs or organizations: Not on file    Relationship status: Not on file  . Intimate partner violence    Fear of current or ex partner: Not on file    Emotionally abused: Not on file    Physically abused: Not on file    Forced sexual activity: Not on file  Other Topics Concern  . Not on file  Social History Narrative  .  Not on file     Review of Systems: General: negative for chills, fever, night sweats or weight changes.  Cardiovascular: negative for chest pain, dyspnea on exertion, edema, orthopnea, palpitations, paroxysmal nocturnal dyspnea or shortness of breath Dermatological: negative for rash Respiratory: negative for cough or wheezing Urologic: negative for hematuria Abdominal: negative for nausea, vomiting, diarrhea, bright red blood per rectum, melena, or hematemesis Neurologic: negative for visual changes, syncope, or dizziness All other systems reviewed and are otherwise negative except as noted above.    Blood pressure 128/76, pulse (!) 59, temperature (!) 96.8 F (36 C), height 5' (1.524 m), weight 183 lb (83 kg), last menstrual period 06/16/1997.  General appearance: alert and no distress Neck: no adenopathy, no carotid bruit, no JVD, supple, symmetrical, trachea  midline and thyroid not enlarged, symmetric, no tenderness/mass/nodules Lungs: clear to auscultation bilaterally Heart: regular rate and rhythm, S1, S2 normal, no murmur, click, rub or gallop Extremities: extremities normal, atraumatic, no cyanosis or edema Pulses: 2+ and symmetric Skin: Skin color, texture, turgor normal. No rashes or lesions Neurologic: Alert and oriented X 3, normal strength and tone. Normal symmetric reflexes. Normal coordination and gait  EKG sinus bradycardia 59 without ST or T wave changes.  Personally reviewed this EKG.  ASSESSMENT AND PLAN:   Peripheral arterial disease ([place]) Ms. Scarsella was referred to me by Dr. Pamella Pert for evaluation treatment of symptomatic PAD.  She does have bilateral calf claudication with ambulation of the last year with positive risk factors including tobacco abuse, and treated hypertension.  She has had lower extremity arterial Doppler studies performed in our office 04/14/2019 revealing a right ABI of 0.92 a left ABI of 0.54 with a high-frequency signal in her left SFA.  She she wants to proceed with angiography and potential endovascular therapy for lifestyle limiting claudication.  Essential hypertension History of essential potential blood pressure measured today 128/76.  She is on low-dose amlodipine.      Lorretta Harp MD FACP,FACC,FAHA, [facility] 04/26/2019 11:48 AM

## 2019-04-26 NOTE — Assessment & Plan Note (Signed)
Alicia Parker was referred to me by Dr. Pamella Pert for evaluation treatment of symptomatic PAD.  She does have bilateral calf claudication with ambulation of the last year with positive risk factors including tobacco abuse, and treated hypertension.  She has had lower extremity arterial Doppler studies performed in our office 04/14/2019 revealing a right ABI of 0.92 a left ABI of 0.54 with a high-frequency signal in her left SFA.  She she wants to proceed with angiography and potential endovascular therapy for lifestyle limiting claudication.

## 2019-04-27 ENCOUNTER — Other Ambulatory Visit: Payer: Self-pay | Admitting: Family Medicine

## 2019-04-27 LAB — BASIC METABOLIC PANEL
BUN/Creatinine Ratio: 13 (ref 12–28)
BUN: 10 mg/dL (ref 8–27)
CO2: 22 mmol/L (ref 20–29)
Calcium: 9.8 mg/dL (ref 8.7–10.3)
Chloride: 99 mmol/L (ref 96–106)
Creatinine, Ser: 0.8 mg/dL (ref 0.57–1.00)
GFR calc Af Amer: 85 mL/min/{1.73_m2} (ref >59)
GFR calc non Af Amer: 74 mL/min/{1.73_m2} (ref >59)
Glucose: 75 mg/dL (ref 65–99)
Potassium: 5.5 mmol/L — ABNORMAL HIGH (ref 3.5–5.2)
Sodium: 137 mmol/L (ref 134–144)

## 2019-04-27 LAB — CBC
Hematocrit: 36.4 % (ref 34.0–46.6)
Hemoglobin: 12 g/dL (ref 11.1–15.9)
MCH: 27.9 pg (ref 26.6–33.0)
MCHC: 33 g/dL (ref 31.5–35.7)
MCV: 85 fL (ref 79–97)
Platelets: 285 10*3/uL (ref 150–450)
RBC: 4.3 x10E6/uL (ref 3.77–5.28)
RDW: 12.8 % (ref 11.7–15.4)
WBC: 6.1 10*3/uL (ref 3.4–10.8)

## 2019-04-27 NOTE — Telephone Encounter (Signed)
Requested Prescriptions  Pending Prescriptions Disp Refills  . famotidine (PEPCID) 20 MG tablet [Pharmacy Med Name: FAMOTIDINE 20 MG TABLET] 90 tablet 1    Sig: TAKE 1 TABLET BY MOUTH EVERYDAY AT BEDTIME     Gastroenterology:  H2 Antagonists Passed - 04/27/2019  2:45 PM      Passed - Valid encounter within last 12 months    Recent Outpatient Visits          3 weeks ago Generalized anxiety disorder   [facility], Lilia Argue, MD   2 months ago Generalized anxiety disorder   Primary Care at [facility], Fenton Malling, MD   3 months ago Encounter for Medicare annual wellness exam   [facility], Lilia Argue, MD   10 months ago Essential hypertension, benign   [facility] at [facility], Lilia Argue, MD   1 year ago Injury of right shoulder, initial encounter   Primary Care at [facility], Gelene Mink, PA-C      Future Appointments            In 1 week Rutherford Guys, MD Primary Care at [place], [place]   In 1 month Gwenlyn Found, Pearletha Forge, MD [place], [place]   In 3 months Pamella Pert, Lilia Argue, MD Primary Care at [place], [facility]

## 2019-05-09 ENCOUNTER — Ambulatory Visit (INDEPENDENT_AMBULATORY_CARE_PROVIDER_SITE_OTHER): Payer: Medicare HMO | Admitting: Family Medicine

## 2019-05-09 ENCOUNTER — Other Ambulatory Visit: Payer: Self-pay

## 2019-05-09 ENCOUNTER — Encounter: Payer: Self-pay | Admitting: Family Medicine

## 2019-05-09 VITALS — BP 110/76 | HR 64 | Temp 98.2°F | Ht 60.0 in | Wt 186.4 lb

## 2019-05-09 DIAGNOSIS — G4733 Obstructive sleep apnea (adult) (pediatric): Secondary | ICD-10-CM | POA: Diagnosis not present

## 2019-05-09 DIAGNOSIS — Z9989 Dependence on other enabling machines and devices: Secondary | ICD-10-CM | POA: Diagnosis not present

## 2019-05-09 DIAGNOSIS — I10 Essential (primary) hypertension: Secondary | ICD-10-CM | POA: Diagnosis not present

## 2019-05-09 DIAGNOSIS — I739 Peripheral vascular disease, unspecified: Secondary | ICD-10-CM | POA: Diagnosis not present

## 2019-05-09 DIAGNOSIS — F411 Generalized anxiety disorder: Secondary | ICD-10-CM

## 2019-05-09 NOTE — Progress Notes (Signed)
11/23/202011:37 AM  Alicia Parker [birth year], 73 y.o., female [id number]  Chief Complaint  Patient presents with   Follow-up    anxiety and pain in both calves, still having the pain when she walks. The anxiety that she is feeling is due to her sick brother    HPI:   Patient is a 73 y.o. female with past medical history significant for HTN, GAD, GERD, colonic polyps, who presents today for routine followup  Seen Oct 2020 Had sleep study - severe OSA, started on cpap - getting used to it, uses every night, starting to notice improved sleep Sent to vasc surg for claudication - scheduled for arteriogram on nov 30th, started on ASA 81mg  Otherwise feels anxiety well controlled on current dose of sertraline. She is having acute increase in anxiety but that is situational as her brother is very ill  She has no acute concerns today  Depression screen [facility] 2/9 04/01/2019 02/18/2019 01/25/2019  Decreased Interest 0 2 0  Down, Depressed, Hopeless 0 1 0  PHQ - 2 Score 0 3 0  Altered sleeping - 2 -  Tired, decreased energy - 3 -  Change in appetite - 1 -  Feeling bad or failure about yourself  - 0 -  Trouble concentrating - 0 -  Moving slowly or fidgety/restless - 1 -  Suicidal thoughts - 0 -  PHQ-9 Score - 10 -    Fall Risk  05/09/2019 04/01/2019 02/18/2019 01/25/2019 06/04/2018  Falls in the past year? 0 0 0 0 0  Comment - - - - -  Number falls in past yr: 0 0 0 0 -  Injury with Fall? 0 0 0 0 -  Follow up - - Falls evaluation completed - -     No Known Allergies  Prior to Admission medications   Medication Sig Start Date End Date Taking? Authorizing Provider  amLODipine (NORVASC) 2.5 MG tablet Take 1 tablet (2.5 mg total) by mouth daily. 01/25/19  Yes Rutherford Guys, MD  aspirin EC 81 MG tablet Take 81 mg by mouth daily.   Yes [provider]  bismuth subsalicylate (PEPTO BISMOL) 262 MG/15ML suspension Take 30 mLs by mouth every 6 (six) hours as needed for indigestion.    Yes [provider]  famotidine (PEPCID) 20 MG tablet TAKE 1 TABLET BY MOUTH EVERYDAY AT BEDTIME Patient taking differently: Take 20 mg by mouth at bedtime.  04/27/19  Yes Rutherford Guys, MD  loratadine (CLARITIN) 10 MG tablet Take 10 mg by mouth at bedtime.   Yes [provider]  naproxen sodium (ALEVE) 220 MG tablet Take 440 mg by mouth daily as needed (headaches).   Yes [provider]  Omega-3 Fatty Acids (OMEGA 3 PO) Take 2 capsules by mouth 3 (three) times daily with meals.   Yes [provider]  sertraline (ZOLOFT) 50 MG tablet Take 1.5 tablets (75 mg total) by mouth daily. 04/01/19  Yes Rutherford Guys, MD    Past Medical History:  Diagnosis Date   Cataract    Essential hypertension, benign    GERD (gastroesophageal reflux disease)    Hyperlipidemia    no current medications   Sleep apnea    doesnt wear cpap     Past Surgical History:  Procedure Laterality Date   [place]   COLONOSCOPY  2004   polyps   REDUCTION MAMMAPLASTY Bilateral    TOE SURGERY     TUBAL  LIGATION  1968   UPPER GASTROINTESTINAL ENDOSCOPY      Social History   Tobacco Use   Smoking status: Former Smoker    Packs/day: 1.00    Years: 10.00    Pack years: 10.00    Types: Cigarettes    Quit date: 11/2018    Years since quitting: 0.4   Smokeless tobacco: Never Used  Substance Use Topics   Alcohol use: Yes    Alcohol/week: 6.0 standard drinks    Types: 6 Cans of beer per week    Comment: daily-     Family History  Problem Relation Age of Onset   Hypertension Mother    Stroke Mother    Hypertension Father    Leukemia Father 82   Early death Brother        pneumonia   Hypertension Maternal Grandmother    Early death Brother    Colon polyps Sister    Colon cancer Neg Hx    Esophageal cancer Neg Hx    Rectal cancer Neg Hx    Stomach cancer Neg Hx     Review of Systems    Constitutional: Negative for chills and fever.  Respiratory: Negative for cough and shortness of breath.   Cardiovascular: Negative for chest pain, palpitations and leg swelling.  Gastrointestinal: Negative for abdominal pain, nausea and vomiting.   Per hpi  OBJECTIVE:  Today's Vitals   05/09/19 1133  BP: 110/76  Pulse: 64  Temp: 98.2 F (36.8 C)  SpO2: 98%  Weight: 186 lb 6.4 oz (84.6 kg)  Height: 5' (1.524 m)   Body mass index is 36.4 kg/m.   Physical Exam Vitals signs and nursing note reviewed.  Constitutional:      Appearance: She is well-developed.  HENT:     Head: Normocephalic and atraumatic.     Mouth/Throat:     Pharynx: No oropharyngeal exudate.  Eyes:     General: No scleral icterus.    Conjunctiva/sclera: Conjunctivae normal.     Pupils: Pupils are equal, round, and reactive to light.  Neck:     Musculoskeletal: Neck supple.  Cardiovascular:     Rate and Rhythm: Normal rate and regular rhythm.     Heart sounds: Normal heart sounds. No murmur. No friction rub. No gallop.   Pulmonary:     Effort: Pulmonary effort is normal.     Breath sounds: Normal breath sounds. No wheezing or rales.  Skin:    General: Skin is warm and dry.  Neurological:     Mental Status: She is alert and oriented to person, place, and time.     No results found for this or any previous visit (from the past 24 hour(s)).  No results found.   ASSESSMENT and Parker  1. Generalized anxiety disorder Controlled. Continue current regime.   2. Essential hypertension Controlled. Continue current regime.   3. Peripheral arterial disease ([place]) Managed by vasc sugr, upcoming intervention  4. OSA on CPAP Managed by sleep. Reports compliance  Return in about 3 months (around 08/09/2019).    Rutherford Guys, MD Primary Care at [place], [place] [address] Ph.  [phone] Fax [phone]

## 2019-05-09 NOTE — Patient Instructions (Signed)
° ° ° °  If you have lab work done today you will be contacted with your lab results within the next 2 weeks.  If you have not heard from us then please contact us. The fastest way to get your results is to register for My Chart. ° ° °IF you received an x-ray today, you will receive an invoice from [facility]. Please contact [facility] at [phone] with questions or concerns regarding your invoice.  ° °IF you received labwork today, you will receive an invoice from [facility]. Please contact [facility] at [phone] with questions or concerns regarding your invoice.  ° °Our billing staff will not be able to assist you with questions regarding bills from these companies. ° °You will be contacted with the lab results as soon as they are available. The fastest way to get your results is to activate your My Chart account. Instructions are located on the last page of this paperwork. If you have not heard from us regarding the results in 2 weeks, please contact this office. °  ° ° ° °

## 2019-05-10 ENCOUNTER — Telehealth: Payer: Self-pay | Admitting: Cardiovascular Disease

## 2019-05-10 DIAGNOSIS — G4733 Obstructive sleep apnea (adult) (pediatric): Secondary | ICD-10-CM | POA: Diagnosis not present

## 2019-05-10 NOTE — Telephone Encounter (Signed)
Called patient, she had questions on what time and where to go for 11/30- advised of questions, and gave instructions. Patient verbalized understanding.

## 2019-05-10 NOTE — Telephone Encounter (Signed)
New messages  Pt has questions about procedure 05/16/19.  Please call

## 2019-05-11 ENCOUNTER — Telehealth: Payer: Self-pay | Admitting: *Deleted

## 2019-05-11 NOTE — Telephone Encounter (Signed)
Pt contacted pre-abdominal aortogram scheduled at [facility] for: Monday May 16, 2019 7:30 AM Verified arrival time and place: [facility]) at: 5:30 AM   No solid food after midnight prior to cath, clear liquids until 5 AM day of procedure. Contrast allergy: no  AM meds can be  taken pre-cath with sip of water including: ASA 81 mg   Confirmed patient has responsible adult to drive home post procedure and observe 24 hours after arriving home: yes  Currently, due to Covid-19 pandemic, only one support person will be allowed with patient. Must be the same support person for that patient's entire stay, will be screened and required to wear a mask. They will be asked to wait in the waiting room for the duration of the patient's stay.  Patients are required to wear a mask when they enter the hospital.     COVID-19 Pre-Screening Questions:  . In the past 7 to 10 days have you had a cough,  shortness of breath, headache, congestion, fever (100 or greater) body aches, chills, sore throat, or sudden loss of taste or sense of smell? no . Have you been around anyone with known Covid 19? no . Have you been around anyone who is awaiting Covid 19 test results in the past 7 to 10 days? no . Have you been around anyone who has been exposed to Covid 19, or has mentioned symptoms of Covid 19 within the past 7 to 10 days? no   I reviewed procedure/mask/visitor instructions, Covid-19 screening questions with patient, she verbalized understanding, thanked me for call.

## 2019-05-13 ENCOUNTER — Other Ambulatory Visit (HOSPITAL_COMMUNITY)
Admission: RE | Admit: 2019-05-13 | Discharge: 2019-05-13 | Disposition: A | Discharge status: Home / Self Care | Payer: Medicare HMO | Source: Ambulatory Visit | Attending: Cardiovascular Disease | Admitting: Cardiovascular Disease

## 2019-05-13 DIAGNOSIS — Z01812 Encounter for preprocedural laboratory examination: Secondary | ICD-10-CM | POA: Insufficient documentation

## 2019-05-13 DIAGNOSIS — Z20828 Contact with and (suspected) exposure to other viral communicable diseases: Secondary | ICD-10-CM | POA: Diagnosis not present

## 2019-05-13 LAB — SARS CORONAVIRUS 2 (TAT 6-24 HRS): SARS Coronavirus 2: NEGATIVE

## 2019-05-16 ENCOUNTER — Encounter (HOSPITAL_COMMUNITY): Payer: Self-pay | Admitting: Cardiovascular Disease

## 2019-05-16 ENCOUNTER — Encounter (HOSPITAL_COMMUNITY)
Admission: RE | Disposition: A | Discharge status: Home / Self Care | Payer: Self-pay | Source: Home / Self Care | Attending: Cardiovascular Disease

## 2019-05-16 ENCOUNTER — Ambulatory Visit (HOSPITAL_COMMUNITY)
Admission: RE | Admit: 2019-05-16 | Discharge: 2019-05-16 | Disposition: A | Discharge status: Home / Self Care | Payer: Medicare HMO | Attending: Cardiovascular Disease | Admitting: Cardiovascular Disease

## 2019-05-16 DIAGNOSIS — Z87891 Personal history of nicotine dependence: Secondary | ICD-10-CM | POA: Insufficient documentation

## 2019-05-16 DIAGNOSIS — Z79899 Other long term (current) drug therapy: Secondary | ICD-10-CM | POA: Diagnosis not present

## 2019-05-16 DIAGNOSIS — I739 Peripheral vascular disease, unspecified: Secondary | ICD-10-CM

## 2019-05-16 DIAGNOSIS — G4733 Obstructive sleep apnea (adult) (pediatric): Secondary | ICD-10-CM | POA: Insufficient documentation

## 2019-05-16 DIAGNOSIS — I10 Essential (primary) hypertension: Secondary | ICD-10-CM | POA: Insufficient documentation

## 2019-05-16 DIAGNOSIS — I70212 Atherosclerosis of native arteries of extremities with intermittent claudication, left leg: Secondary | ICD-10-CM | POA: Diagnosis not present

## 2019-05-16 HISTORY — PX: ABDOMINAL AORTOGRAM W/LOWER EXTREMITY: CATH118223

## 2019-05-16 HISTORY — PX: PERIPHERAL VASCULAR INTERVENTION: CATH118257

## 2019-05-16 LAB — POCT ACTIVATED CLOTTING TIME: Activated Clotting Time: 312 seconds

## 2019-05-16 SURGERY — ABDOMINAL AORTOGRAM W/LOWER EXTREMITY
Anesthesia: LOCAL

## 2019-05-16 MED ORDER — HEPARIN (PORCINE) IN NACL 1000-0.9 UT/500ML-% IV SOLN
INTRAVENOUS | Status: AC
  Filled 2019-05-16: qty 1000

## 2019-05-16 MED ORDER — LIDOCAINE HCL (PF) 1 % IJ SOLN
INTRAMUSCULAR | Status: AC
  Filled 2019-05-16: qty 30

## 2019-05-16 MED ORDER — SODIUM CHLORIDE 0.9% FLUSH: 3.0000 mL | INTRAVENOUS | Status: DC | PRN

## 2019-05-16 MED ORDER — CLOPIDOGREL BISULFATE 75 MG PO TABS: 75.0000 mg | ORAL_TABLET | Freq: Every day | ORAL | Status: DC

## 2019-05-16 MED ORDER — MORPHINE SULFATE (PF) 2 MG/ML IV SOLN: 2.0000 mg | INTRAVENOUS | Status: DC | PRN

## 2019-05-16 MED ORDER — LABETALOL HCL 5 MG/ML IV SOLN: 10.0000 mg | INTRAVENOUS | Status: DC | PRN

## 2019-05-16 MED ORDER — LIDOCAINE HCL (PF) 1 % IJ SOLN
INTRAMUSCULAR | Status: DC | PRN
  Administered 2019-05-16: 15 mL via INTRADERMAL

## 2019-05-16 MED ORDER — MIDAZOLAM HCL 2 MG/2ML IJ SOLN
INTRAMUSCULAR | Status: AC
  Filled 2019-05-16: qty 2

## 2019-05-16 MED ORDER — HYDRALAZINE HCL 20 MG/ML IJ SOLN: 5.0000 mg | INTRAMUSCULAR | Status: DC | PRN

## 2019-05-16 MED ORDER — CLOPIDOGREL BISULFATE 300 MG PO TABS
ORAL_TABLET | ORAL | Status: AC
  Filled 2019-05-16: qty 1

## 2019-05-16 MED ORDER — ASPIRIN EC 81 MG PO TBEC: 81.0000 mg | DELAYED_RELEASE_TABLET | Freq: Every day | ORAL | Status: DC

## 2019-05-16 MED ORDER — SODIUM CHLORIDE 0.9 % IV SOLN: 250.0000 mL | INTRAVENOUS | Status: DC | PRN

## 2019-05-16 MED ORDER — ONDANSETRON HCL 4 MG/2ML IJ SOLN: 4.0000 mg | Freq: Four times a day (QID) | INTRAMUSCULAR | Status: DC | PRN

## 2019-05-16 MED ORDER — ACETAMINOPHEN 325 MG PO TABS: 650.0000 mg | ORAL_TABLET | ORAL | Status: DC | PRN

## 2019-05-16 MED ORDER — HEPARIN SODIUM (PORCINE) 1000 UNIT/ML IJ SOLN
INTRAMUSCULAR | Status: AC
  Filled 2019-05-16: qty 1

## 2019-05-16 MED ORDER — CLOPIDOGREL BISULFATE 300 MG PO TABS
ORAL_TABLET | ORAL | Status: DC | PRN
  Administered 2019-05-16: 300 mg via ORAL

## 2019-05-16 MED ORDER — SODIUM CHLORIDE 0.9 % IV SOLN: INTRAVENOUS | Status: DC

## 2019-05-16 MED ORDER — FENTANYL CITRATE (PF) 100 MCG/2ML IJ SOLN
INTRAMUSCULAR | Status: DC | PRN
  Administered 2019-05-16 (×2): 25 ug via INTRAVENOUS

## 2019-05-16 MED ORDER — IODIXANOL 320 MG/ML IV SOLN
INTRAVENOUS | Status: DC | PRN
  Administered 2019-05-16: 160 mL via INTRA_ARTERIAL

## 2019-05-16 MED ORDER — MIDAZOLAM HCL 2 MG/2ML IJ SOLN
INTRAMUSCULAR | Status: DC | PRN
  Administered 2019-05-16: 1 mg via INTRAVENOUS

## 2019-05-16 MED ORDER — HEPARIN SODIUM (PORCINE) 1000 UNIT/ML IJ SOLN
INTRAMUSCULAR | Status: DC | PRN
  Administered 2019-05-16: 8000 [IU] via INTRAVENOUS

## 2019-05-16 MED ORDER — FENTANYL CITRATE (PF) 100 MCG/2ML IJ SOLN
INTRAMUSCULAR | Status: AC
  Filled 2019-05-16: qty 2

## 2019-05-16 MED ORDER — HEPARIN (PORCINE) IN NACL 1000-0.9 UT/500ML-% IV SOLN
INTRAVENOUS | Status: DC | PRN
  Administered 2019-05-16 (×2): 500 mL

## 2019-05-16 MED ORDER — SODIUM CHLORIDE 0.9 % WEIGHT BASED INFUSION
3.0000 mL/kg/h | INTRAVENOUS | Status: AC
  Administered 2019-05-16: 3 mL/kg/h via INTRAVENOUS

## 2019-05-16 MED ORDER — SODIUM CHLORIDE 0.9% FLUSH: 3.0000 mL | Freq: Two times a day (BID) | INTRAVENOUS | Status: DC

## 2019-05-16 MED ORDER — SODIUM CHLORIDE 0.9 % WEIGHT BASED INFUSION: 1.0000 mL/kg/h | INTRAVENOUS | Status: DC

## 2019-05-16 MED ORDER — ASPIRIN 81 MG PO CHEW: 81.0000 mg | CHEWABLE_TABLET | ORAL | Status: AC

## 2019-05-16 SURGICAL SUPPLY — 25 items
BALLN COYOTE ES OTW 4X20X143 (BALLOONS) ×3
BALLN STERLING OTW 5X40X135 (BALLOONS) ×3
BALLOON COYOTE ES OTW 4X20X143 (BALLOONS) ×2 IMPLANT
BALLOON STERLING OTW 5X40X135 (BALLOONS) ×2 IMPLANT
CATH ANGIO 5F PIGTAIL 65CM (CATHETERS) ×3 IMPLANT
CATH CROSS OVER TEMPO 5F (CATHETERS) ×3 IMPLANT
CLOSURE MYNX CONTROL 6F/7F (Vascular Products) ×3 IMPLANT
GUIDEWIRE ANGLED .035X150CM (WIRE) ×3 IMPLANT
KIT ENCORE 26 ADVANTAGE (KITS) ×3 IMPLANT
KIT PV (KITS) ×3 IMPLANT
SHEATH HIGHFLEX ANSEL 6FRX55 (SHEATH) ×3 IMPLANT
SHEATH PINNACLE 5F 10CM (SHEATH) ×3 IMPLANT
SHEATH PINNACLE 6F 10CM (SHEATH) ×3 IMPLANT
SHEATH PROBE COVER 6X72 (BAG) ×3 IMPLANT
STENT ELUVIA 6X40X130 (Permanent Stent) ×3 IMPLANT
STOPCOCK MORSE 400PSI 3WAY (MISCELLANEOUS) ×3 IMPLANT
SYR MEDRAD MARK 7 150ML (SYRINGE) ×3 IMPLANT
TAPE VIPERTRACK RADIOPAQ (MISCELLANEOUS) ×2 IMPLANT
TAPE VIPERTRACK RADIOPAQUE (MISCELLANEOUS) ×1
TRANSDUCER W/STOPCOCK (MISCELLANEOUS) ×3 IMPLANT
TRAY PV CATH (CUSTOM PROCEDURE TRAY) ×3 IMPLANT
TUBING CIL FLEX 10 FLL-RA (TUBING) ×3 IMPLANT
WIRE HITORQ VERSACORE ST 145CM (WIRE) ×3 IMPLANT
WIRE SPARTACORE .014X190CM (WIRE) ×3 IMPLANT
WIRE SPARTACORE .014X300CM (WIRE) ×3 IMPLANT

## 2019-05-16 NOTE — Interval H&P Note (Signed)
History and Physical Interval Note:  05/16/2019 7:40 AM  Alicia Parker  has presented today for surgery, with the diagnosis of pad.  The various methods of treatment have been discussed with the patient and family. After consideration of risks, benefits and other options for treatment, the patient has consented to  Procedure(s): ABDOMINAL AORTOGRAM W/LOWER EXTREMITY (N/A) as a surgical intervention.  The patient's history has been reviewed, patient examined, no change in status, stable for surgery.  I have reviewed the patient's chart and labs.  Questions were answered to the patient's satisfaction.     Quay Burow

## 2019-05-16 NOTE — Discharge Instructions (Signed)
Femoral Site Care °This sheet gives you information about how to care for yourself after your procedure. Your health care provider may also give you more specific instructions. If you have problems or questions, contact your health care provider. °What can I expect after the procedure? °After the procedure, it is common to have: °· Bruising that usually fades within 1-2 weeks. °· Tenderness at the site. °Follow these instructions at home: °Wound care °· Follow instructions from your health care provider about how to take care of your insertion site. Make sure you: °? Wash your hands with soap and water before you change your bandage (dressing). If soap and water are not available, use hand sanitizer. °? Change your dressing as told by your health care provider. °? Leave stitches (sutures), skin glue, or adhesive strips in place. These skin closures may need to stay in place for 2 weeks or longer. If adhesive strip edges start to loosen and curl up, you may trim the loose edges. Do not remove adhesive strips completely unless your health care provider tells you to do that. °· Do not take baths, swim, or use a hot tub until your health care provider approves. °· You may shower 24-48 hours after the procedure or as told by your health care provider. °? Gently wash the site with plain soap and water. °? Pat the area dry with a clean towel. °? Do not rub the site. This may cause bleeding. °· Do not apply powder or lotion to the site. Keep the site clean and dry. °· Check your femoral site every day for signs of infection. Check for: °? Redness, swelling, or pain. °? Fluid or blood. °? Warmth. °? Pus or a bad smell. °Activity °· For the first 2-3 days after your procedure, or as long as directed: °? Avoid climbing stairs as much as possible. °? Do not squat. °· Do not lift anything that is heavier than 10 lb (4.5 kg), or the limit that you are told, until your health care provider says that it is safe. °· Rest as  directed. °? Avoid sitting for a long time without moving. Get up to take short walks every 1-2 hours. °· Do not drive for 24 hours if you were given a medicine to help you relax (sedative). °General instructions °· Take over-the-counter and prescription medicines only as told by your health care provider. °· Keep all follow-up visits as told by your health care provider. This is important. °Contact a health care provider if you have: °· A fever or chills. °· You have redness, swelling, or pain around your insertion site. °Get help right away if: °· The catheter insertion area swells very fast. °· You pass out. °· You suddenly start to sweat or your skin gets clammy. °· The catheter insertion area is bleeding, and the bleeding does not stop when you hold steady pressure on the area. °· The area near or just beyond the catheter insertion site becomes pale, cool, tingly, or numb. °These symptoms may represent a serious problem that is an emergency. Do not wait to see if the symptoms will go away. Get medical help right away. Call your local emergency services (911 in the [place] Do not drive yourself to the hospital. °Summary °· After the procedure, it is common to have bruising that usually fades within 1-2 weeks. °· Check your femoral site every day for signs of infection. °· Do not lift anything that is heavier than 10 lb (4.5 kg), or the   limit that you are told, until your health care provider says that it is safe. °This information is not intended to replace advice given to you by your health care provider. Make sure you discuss any questions you have with your health care provider. °Document Released: 02/03/2014 Document Revised: 06/15/2017 Document Reviewed: 06/15/2017 °Elsevier Patient Education © 2020 Elsevier Inc. ° °

## 2019-05-18 DIAGNOSIS — G4733 Obstructive sleep apnea (adult) (pediatric): Secondary | ICD-10-CM | POA: Diagnosis not present

## 2019-05-30 ENCOUNTER — Other Ambulatory Visit: Payer: Self-pay

## 2019-05-30 ENCOUNTER — Ambulatory Visit (HOSPITAL_COMMUNITY)
Admission: RE | Admit: 2019-05-30 | Discharge: 2019-05-30 | Disposition: A | Discharge status: Home / Self Care | Payer: Medicare HMO | Source: Ambulatory Visit | Attending: Cardiovascular Disease | Admitting: Cardiovascular Disease

## 2019-05-30 ENCOUNTER — Other Ambulatory Visit (HOSPITAL_COMMUNITY): Payer: Self-pay | Admitting: Cardiovascular Disease

## 2019-05-30 ENCOUNTER — Encounter (HOSPITAL_COMMUNITY): Payer: Medicare HMO

## 2019-05-30 DIAGNOSIS — Z95820 Peripheral vascular angioplasty status with implants and grafts: Secondary | ICD-10-CM

## 2019-05-30 DIAGNOSIS — I739 Peripheral vascular disease, unspecified: Secondary | ICD-10-CM | POA: Diagnosis not present

## 2019-05-30 DIAGNOSIS — I10 Essential (primary) hypertension: Secondary | ICD-10-CM | POA: Diagnosis not present

## 2019-05-31 ENCOUNTER — Ambulatory Visit
Admission: RE | Admit: 2019-05-31 | Discharge: 2019-05-31 | Disposition: A | Discharge status: Home / Self Care | Payer: Medicare HMO | Source: Ambulatory Visit | Attending: Family Medicine | Admitting: Family Medicine

## 2019-05-31 DIAGNOSIS — Z1231 Encounter for screening mammogram for malignant neoplasm of breast: Secondary | ICD-10-CM

## 2019-05-31 DIAGNOSIS — I739 Peripheral vascular disease, unspecified: Secondary | ICD-10-CM

## 2019-06-01 ENCOUNTER — Telehealth (INDEPENDENT_AMBULATORY_CARE_PROVIDER_SITE_OTHER): Payer: Medicare HMO | Admitting: Cardiovascular Disease

## 2019-06-01 ENCOUNTER — Telehealth: Payer: Self-pay | Admitting: Cardiovascular Disease

## 2019-06-01 DIAGNOSIS — I739 Peripheral vascular disease, unspecified: Secondary | ICD-10-CM

## 2019-06-01 DIAGNOSIS — I10 Essential (primary) hypertension: Secondary | ICD-10-CM

## 2019-06-01 MED ORDER — CLOPIDOGREL BISULFATE 75 MG PO TABS: 75.0000 mg | ORAL_TABLET | Freq: Every day | ORAL | 3 refills | Status: DC

## 2019-06-01 NOTE — Progress Notes (Signed)
Virtual Visit via Telephone Note   This visit type was conducted due to national recommendations for restrictions regarding the COVID-19 Pandemic (e.g. social distancing) in an effort to limit this patient's exposure and mitigate transmission in our community.  Due to her co-morbid illnesses, this patient is at least at moderate risk for complications without adequate follow up.  This format is felt to be most appropriate for this patient at this time.  The patient did not have access to video technology/had technical difficulties with video requiring transitioning to audio format only (telephone).  All issues noted in this document were discussed and addressed.  No physical exam could be performed with this format.  Please refer to the patient's chart for her  consent to telehealth for [facility].   Date:  06/01/2019   ID:  Alicia Parker, DOB 1945-07-16, MRN [id number]  Patient Location: Home Provider Location: Home  PCP:  Rutherford Guys, MD  Cardiologist: Dr. Donnella Bi Electrophysiologist:  None   Evaluation Performed:  Follow-Up Visit  Chief Complaint: Peripheral arterial disease  History of Present Illness:    Alicia Parker is a 73 y.o. moderately overweight married African-American female mother of 3 children, grandmother of 65 grandchildren referred by Dr. Pamella Pert for evaluation treatment of symptomatic PAD.  I last saw her in the office 04/26/2019. She is retired from working at [employer] in [employer].  Risk factors include 30 to 50 pack years of tobacco abuse having recently quit 11/18/2018.  She does have treated hypertension on low-dose amlodipine.  She also has obstructive sleep apnea on CPAP which is recently started.  There is no family history of heart disease.  She is never had a heart attack or stroke.  She denies chest pain or shortness of breath.  She has noticed bilateral lower extremity claudication which is fairly symmetric over the last year with  recent lower extremity Dopplers performed 04/14/2019 revealing a right ABI 1.92 and a left ABI of 0.54 with a high-frequency signal in her left SFA.  I performed peripheral angiography on her 05/16/2019 revealing mid left SFA disease which I intervened on using a Eluvia drug-eluting stent with an excellent result.  She was discharged home the following day.  Her follow-up Dopplers performed 05/30/2019 revealed an increase in her left ABI from 0.54-0.8 with excellent velocity Doppler signals.  Her claudication has significantly improved.   The patient does not have symptoms concerning for COVID-19 infection (fever, chills, cough, or new shortness of breath).    Past Medical History:  Diagnosis Date   Cataract    Essential hypertension, benign    GERD (gastroesophageal reflux disease)    Hyperlipidemia    no current medications   Sleep apnea    doesnt wear cpap    Past Surgical History:  Procedure Laterality Date   ABDOMINAL AORTOGRAM W/LOWER EXTREMITY N/A 05/16/2019   Procedure: ABDOMINAL AORTOGRAM W/LOWER EXTREMITY;  Surgeon: Lorretta Harp, MD;  Location: [place] [facility];  Service: Cardiovascular;  Laterality: N/A;   [place]   COLONOSCOPY  2004   polyps   PERIPHERAL VASCULAR INTERVENTION  05/16/2019   Procedure: PERIPHERAL VASCULAR INTERVENTION;  Surgeon: Lorretta Harp, MD;  Location: [place] [facility];  Service: Cardiovascular;;   REDUCTION MAMMAPLASTY Bilateral    TOE SURGERY     TUBAL LIGATION  1968   UPPER GASTROINTESTINAL ENDOSCOPY       Current Meds  Medication Sig  amLODipine (NORVASC) 2.5 MG tablet Take 1 tablet (2.5 mg total) by mouth daily.   aspirin EC 81 MG tablet Take 81 mg by mouth daily.   bismuth subsalicylate (PEPTO BISMOL) 262 MG/15ML suspension Take 30 mLs by mouth every 6 (six) hours as needed for indigestion.   famotidine (PEPCID) 20 MG tablet TAKE 1 TABLET BY MOUTH EVERYDAY AT BEDTIME  (Patient taking differently: Take 20 mg by mouth at bedtime. )   loratadine (CLARITIN) 10 MG tablet Take 10 mg by mouth at bedtime.   naproxen sodium (ALEVE) 220 MG tablet Take 440 mg by mouth daily as needed (headaches).   Omega-3 Fatty Acids (OMEGA 3 PO) Take 2 capsules by mouth 3 (three) times daily with meals.   sertraline (ZOLOFT) 50 MG tablet Take 1.5 tablets (75 mg total) by mouth daily.     Allergies:   Patient has no known allergies.   Social History   Tobacco Use   Smoking status: Former Smoker    Packs/day: 1.00    Years: 10.00    Pack years: 10.00    Types: Cigarettes    Quit date: 11/2018    Years since quitting: 0.5   Smokeless tobacco: Never Used  Substance Use Topics   Alcohol use: Yes    Alcohol/week: 6.0 standard drinks    Types: 6 Cans of beer per week    Comment: daily-    Drug use: No     Family Hx: The patient's family history includes Colon polyps in her sister; Early death in her brother and brother; Hypertension in her father, maternal grandmother, and mother; Leukemia (age of onset: 1) in her father; Stroke in her mother. There is no history of Colon cancer, Esophageal cancer, Rectal cancer, or Stomach cancer.  ROS:   Please see the history of present illness.     All other systems reviewed and are negative.   Prior CV studies:   The following studies were reviewed today:  Lower extremity arterial Doppler studies performed 05/30/2019  Labs/Other Tests and Data Reviewed:    EKG:  No ECG reviewed.  Recent Labs: 01/25/2019: ALT 13; TSH 3.410 04/26/2019: BUN 10; Creatinine, Ser 0.80; Hemoglobin 12.0; Platelets 285; Potassium 5.5; Sodium 137   Recent Lipid Panel Lab Results  Component Value Date/Time   CHOL 179 12/04/2017 09:04 AM   TRIG 60 12/04/2017 09:04 AM   HDL 69 12/04/2017 09:04 AM   CHOLHDL 2.6 12/04/2017 09:04 AM   CHOLHDL 3.1 10/15/2011 11:30 AM   LDLCALC 98 12/04/2017 09:04 AM    Wt Readings from Last 3 Encounters:    05/16/19 183 lb (83 kg)  05/09/19 186 lb 6.4 oz (84.6 kg)  04/26/19 183 lb (83 kg)     Objective:    Vital Signs:  LMP 06/16/1997    VITAL SIGNS:  reviewed her blood pressure measured by herself at home was 136/66 with a pulse of 67.  A complete physical exam was not performed since this was a virtual telemedicine phone visit  ASSESSMENT & Parker:    1. Peripheral arterial disease-history of symptomatic left calf claudication with recent peripheral vascular intervention performed by myself 05/16/2019 revealing a high-grade mid left SFA stenosis.  I performed PTA and drug coated self-expanding stenting using any Liviu stent with an excellent angiographic and clinical result.  Her claudication has resolved and her Doppler studies performed 05/30/2019 revealed an increase in her left ABI from 0.54 up to 0.80.  For some reason, she was discharged home on  aspirin alone and not Plavix.  We will start 75 mg of clopidogrel daily.  COVID-19 Education: The signs and symptoms of COVID-19 were discussed with the patient and how to seek care for testing (follow up with PCP or arrange E-visit).  The importance of social distancing was discussed today.  Time:   Today, I have spent 5 minutes with the patient with telehealth technology discussing the above problems.     Medication Adjustments/Labs and Tests Ordered: Current medicines are reviewed at length with the patient today.  Concerns regarding medicines are outlined above.   Tests Ordered: No orders of the defined types were placed in this encounter.   Medication Changes: No orders of the defined types were placed in this encounter.   Follow Up:  In Person in 6 month(s)  Signed, Quay Burow, MD  06/01/2019 11:49 AM    [place]

## 2019-06-01 NOTE — Telephone Encounter (Signed)
Pt advised to continue her ASA 81 mg per Dr. Kennon Holter telemed note from today.

## 2019-06-01 NOTE — Telephone Encounter (Signed)
Patient wants to know if she still needs to continue taking Asprin as well.

## 2019-06-01 NOTE — Patient Instructions (Signed)
Medication Instructions:  Start taking 75mg  Plavix Daily  If you need a refill on your cardiac medications before your next appointment, please call your pharmacy.   Lab work: NONE  Testing/Procedures: In [place] has requested that you have a lower extremity arterial exercise duplex. During this test, exercise and ultrasound are used to evaluate arterial blood flow in the legs. Allow one hour for this exam. There are no restrictions or special instructions.  AND  Your physician has requested that you have an ankle brachial index (ABI). During this test an ultrasound and blood pressure cuff are used to evaluate the arteries that supply the arms and legs with blood. Allow thirty minutes for this exam. There are no restrictions or special instructions.    Follow-Up: At [facility], you and your health needs are our priority.  As part of our continuing mission to provide you with exceptional heart care, we have created designated Provider Care Teams.  These Care Teams include your primary Cardiologist (physician) and Advanced Practice Providers (APPs -  Physician Assistants and Nurse Practitioners) who all work together to provide you with the care you need, when you need it. You may see Dr Gwenlyn Found or one of the following Advanced Practice Providers on your designated Care Team:    Kerin Ransom, PA-C  [place], [place]  Coletta Memos, [place]  Your physician wants you to follow-up in: 6 months

## 2019-06-03 ENCOUNTER — Encounter: Payer: Medicare HMO | Admitting: Vascular Surgery

## 2019-06-15 ENCOUNTER — Encounter: Payer: Self-pay | Admitting: Vascular Surgery

## 2019-06-15 ENCOUNTER — Other Ambulatory Visit: Payer: Self-pay

## 2019-06-15 ENCOUNTER — Ambulatory Visit (INDEPENDENT_AMBULATORY_CARE_PROVIDER_SITE_OTHER): Payer: Medicare HMO | Admitting: Vascular Surgery

## 2019-06-15 VITALS — BP 120/63 | HR 58 | Temp 97.5°F | Resp 18 | Ht 60.0 in | Wt 186.6 lb

## 2019-06-15 DIAGNOSIS — I739 Peripheral vascular disease, unspecified: Secondary | ICD-10-CM | POA: Diagnosis not present

## 2019-06-15 NOTE — Progress Notes (Signed)
Patient is a 73 year old female sent for evaluation of decreased pulses in her feet.  She recently had a left superficial femoral artery stent placed by Dr. Alvester Chou on May 16, 2019.  She was seen by Dr. Gwenlyn Found on May 30, 2019 where it was noted her ABIs have increased on the left side from 0.5 2.8.  She also had a duplex ultrasound at that office visit which showed the left leg stent was patent.  She states that her claudication symptoms have resolved.  She has no rest pain.  She is currently taking Plavix aspirin and a statin.  She really has no other complaints.  Past Medical History:  Diagnosis Date  . Cataract   . Essential hypertension, benign   . GERD (gastroesophageal reflux disease)   . Hyperlipidemia    no current medications  . Sleep apnea    doesnt wear cpap     Past Surgical History:  Procedure Laterality Date  . ABDOMINAL AORTOGRAM W/LOWER EXTREMITY N/A 05/16/2019   Procedure: ABDOMINAL AORTOGRAM W/LOWER EXTREMITY;  Surgeon: Lorretta Harp, MD;  Location: [facility];  Service: Cardiovascular;  Laterality: N/A;  . APPENDECTOMY  1968  . CHOLECYSTECTOMY  1968  . COLONOSCOPY  2004   polyps  . PERIPHERAL VASCULAR INTERVENTION  05/16/2019   Procedure: PERIPHERAL VASCULAR INTERVENTION;  Surgeon: Lorretta Harp, MD;  Location: [facility];  Service: Cardiovascular;;  . REDUCTION MAMMAPLASTY Bilateral   . TOE SURGERY    . TUBAL LIGATION  1968  . UPPER GASTROINTESTINAL ENDOSCOPY      Review of systems: She has no shortness of breath.  She has no chest pain.  Current Outpatient Medications on File Prior to Visit  Medication Sig Dispense Refill  . amLODipine (NORVASC) 2.5 MG tablet Take 1 tablet (2.5 mg total) by mouth daily. 90 tablet 3  . aspirin EC 81 MG tablet Take 81 mg by mouth daily.    Marland Kitchen bismuth subsalicylate (PEPTO BISMOL) 262 MG/15ML suspension Take 30 mLs by mouth every 6 (six) hours as needed for indigestion.    . clopidogrel (PLAVIX) 75 MG  tablet Take 1 tablet (75 mg total) by mouth daily. 90 tablet 3  . famotidine (PEPCID) 20 MG tablet TAKE 1 TABLET BY MOUTH EVERYDAY AT BEDTIME (Patient taking differently: Take 20 mg by mouth at bedtime. ) 90 tablet 1  . loratadine (CLARITIN) 10 MG tablet Take 10 mg by mouth at bedtime.    . naproxen sodium (ALEVE) 220 MG tablet Take 440 mg by mouth daily as needed (headaches).    . Omega-3 Fatty Acids (OMEGA 3 PO) Take 2 capsules by mouth 3 (three) times daily with meals.    . sertraline (ZOLOFT) 50 MG tablet Take 1.5 tablets (75 mg total) by mouth daily. 180 tablet 0   No current facility-administered medications on file prior to visit.    No Known Allergies  Physical exam:  Vitals:   06/15/19 1101  BP: 120/63  Pulse: (!) 58  Resp: 18  Temp: (!) 97.5 F (36.4 C)  TempSrc: Temporal  SpO2: 98%  Weight: 186 lb 9.6 oz (84.6 kg)  Height: 5' (1.524 m)    Extremities: No palpable pedal pulses no open ulcer or wound  Data: I reviewed the patient's recent ABIs and lower extremity arterial duplex scan.  Assessment: Peripheral arterial disease.  Currently symptom-free with no evidence of claudication.  Recent left superficial femoral artery stenting by Dr. Gwenlyn Found.  Plan: Patient was counseled to continue  her Plavix aspirin and statin.  She has follow-up scheduled with Dr. Gwenlyn Found in the near future regarding her peripheral arterial disease.  She will follow-up with me on an as-needed basis.  Ruta Hinds, MD [facility] Office: [phone]

## 2019-06-18 DIAGNOSIS — G4733 Obstructive sleep apnea (adult) (pediatric): Secondary | ICD-10-CM | POA: Diagnosis not present

## 2019-06-23 ENCOUNTER — Other Ambulatory Visit: Payer: Self-pay

## 2019-06-23 ENCOUNTER — Encounter: Payer: Self-pay | Admitting: Family Medicine

## 2019-06-23 ENCOUNTER — Ambulatory Visit: Payer: Medicare HMO | Admitting: Family Medicine

## 2019-06-23 VITALS — BP 122/56 | HR 58 | Temp 97.5°F | Ht 60.0 in | Wt 185.4 lb

## 2019-06-23 DIAGNOSIS — Z9989 Dependence on other enabling machines and devices: Secondary | ICD-10-CM | POA: Diagnosis not present

## 2019-06-23 DIAGNOSIS — G4733 Obstructive sleep apnea (adult) (pediatric): Secondary | ICD-10-CM

## 2019-06-23 NOTE — Progress Notes (Addendum)
PATIENT: Alicia Parker DOB: [birth year]  REASON FOR VISIT: follow up HISTORY FROM: patient  Chief Complaint  Patient presents with  . Follow-up    RM2. alone. Pt states that sometimes she doesn't see any difference. Other times it works pretty well.     HISTORY OF PRESENT ILLNESS: Today 06/23/19 Alicia Parker is a 74 y.o. female here today for follow up. Alicia Parker returns today for initial compliance review after being diagnosed with severe obstructive sleep apnea. She was started on CPAP about 2 months ago. She reports that she is doing well on CPAP. She does note improvement in energy. There are some mornings where she has not noticed much benefit with using CPAP. She is motivated to continue using CPAP. She is having no difficulties with the machine or supplies.  Compliance report dated 05/23/2019 through 06/21/2019 reveals that she use CPAP every night the last 30 days for compliance of 100%. She CPAP greater than 4 hours every night. Average usage was 6 hours and 17 minutes. Residual AHI was 4 on 7 to 14 cm of water and an EPR of 3. There was no significant leak.  HISTORY: (copied from Dr Guadelupe Sabin note on 03/03/2019)  Dear Dr. [name], I saw your patient, Alicia Parker, upon your kind request in my sleep clinic today for initial consultation of her sleep disorder, in particular, reevaluation of her prior diagnosis of obstructive sleep apnea.  The patient is unaccompanied today.  As you know, Alicia Parker is a 74 year old right-handed woman with an underlying medical history of reflux disease, anxiety, hypertension, hyperlipidemia, cataracts and obesity, who was previously diagnosed with obstructive sleep apnea and placed on CPAP therapy.  Prior sleep study results are not available for my review today.  She has not been using her CPAP machine in some years.  She has had some weight gain over time.  I reviewed your office note from 02/18/2019.  Her Epworth sleepiness score is 17 out of 24,  fatigue severity score is 54 out of 63.  She is married and lives with her husband and daughter.  She has 3 children.  She quit smoking in June 2020.  She drinks alcohol in the form of beer, about 2/day but is cutting back, and caffeine in the form of coffee, about 1 or 1-1/2 cups/day on average.  She does not typically drink soda on a day-to-day basis or tea.  She has no pets, she has a TV in the bedroom and falls asleep with the TV on.  She is generally in bed before 9 and rise time varies, between 7 and 10.  She is retired from Scientist, research (medical) work.  She reports that her husband has PTSD and is a very restless sleeper and she is planning to separate their bedrooms as none of them are sleeping well.  She has a family history of sleep apnea and her brother who has a CPAP machine and her son.  She has 2 sons and 1 daughter.  She estimates that she has not been on CPAP therapy since 2004, she reports that her machine was from 2000.  She had difficulty with the full facemask.  She would be willing to get retested and consider CPAP therapy again.  She denies recurrent morning headaches but has nocturia about 2 or 3 times per average night and also allergy symptoms.    REVIEW OF SYSTEMS: Out of a complete 14 system review of symptoms, the patient complains only of the following symptoms, none  and all other reviewed systems are negative.  Epworth Sleepiness Scale: 8 Fatigue severity scale: 23  ALLERGIES: No Known Allergies  HOME MEDICATIONS: Outpatient Medications Prior to Visit  Medication Sig Dispense Refill  . amLODipine (NORVASC) 2.5 MG tablet Take 1 tablet (2.5 mg total) by mouth daily. 90 tablet 3  . aspirin EC 81 MG tablet Take 81 mg by mouth daily.    . clopidogrel (PLAVIX) 75 MG tablet Take 1 tablet (75 mg total) by mouth daily. 90 tablet 3  . famotidine (PEPCID) 20 MG tablet TAKE 1 TABLET BY MOUTH EVERYDAY AT BEDTIME 90 tablet 1  . Ibuprofen (ADVIL) 200 MG CAPS Take by mouth. OTC PRN    . loratadine  (CLARITIN) 10 MG tablet Take 10 mg by mouth at bedtime.    . naproxen sodium (ALEVE) 220 MG tablet Take 440 mg by mouth daily as needed (headaches).    . Omega-3 Fatty Acids (OMEGA 3 PO) Take 2 capsules by mouth 3 (three) times daily with meals.    . sertraline (ZOLOFT) 50 MG tablet Take 1.5 tablets (75 mg total) by mouth daily. 180 tablet 0  . bismuth subsalicylate (PEPTO BISMOL) 262 MG/15ML suspension Take 30 mLs by mouth every 6 (six) hours as needed for indigestion.     No facility-administered medications prior to visit.    PAST MEDICAL HISTORY: Past Medical History:  Diagnosis Date  . Cataract   . Essential hypertension, benign   . GERD (gastroesophageal reflux disease)   . Hyperlipidemia    no current medications  . Sleep apnea    doesnt wear cpap     PAST SURGICAL HISTORY: Past Surgical History:  Procedure Laterality Date  . ABDOMINAL AORTOGRAM W/LOWER EXTREMITY N/A 05/16/2019   Procedure: ABDOMINAL AORTOGRAM W/LOWER EXTREMITY;  Surgeon: Lorretta Harp, MD;  Location: [facility];  Service: Cardiovascular;  Laterality: N/A;  . APPENDECTOMY  1968  . CHOLECYSTECTOMY  1968  . COLONOSCOPY  2004   polyps  . PERIPHERAL VASCULAR INTERVENTION  05/16/2019   Procedure: PERIPHERAL VASCULAR INTERVENTION;  Surgeon: Lorretta Harp, MD;  Location: [facility];  Service: Cardiovascular;;  . REDUCTION MAMMAPLASTY Bilateral   . TOE SURGERY    . TUBAL LIGATION  1968  . UPPER GASTROINTESTINAL ENDOSCOPY      FAMILY HISTORY: Family History  Problem Relation Age of Onset  . Hypertension Mother   . Stroke Mother   . Hypertension Father   . Leukemia Father 80  . Early death Brother        pneumonia  . Hypertension Maternal Grandmother   . Early death Brother   . Colon polyps Sister   . Colon cancer Neg Hx   . Esophageal cancer Neg Hx   . Rectal cancer Neg Hx   . Stomach cancer Neg Hx     SOCIAL HISTORY: Social History   Socioeconomic History  . Marital  status: Married    Spouse name: Not on file  . Number of children: Not on file  . Years of education: Not on file  . Highest education level: Not on file  Occupational History  . Not on file  Tobacco Use  . Smoking status: Former Smoker    Packs/day: 1.00    Years: 10.00    Pack years: 10.00    Types: Cigarettes    Quit date: 11/2018    Years since quitting: 0.6  . Smokeless tobacco: Never Used  Substance and Sexual Activity  . Alcohol use:  Yes    Alcohol/week: 6.0 standard drinks    Types: 6 Cans of beer per week    Comment: daily-   . Drug use: No  . Sexual activity: Yes    Partners: Male    Birth control/protection: Surgical    Comment: btl  Other Topics Concern  . Not on file  Social History Narrative  . Not on file   Social Determinants of Health   Financial Resource Strain:   . Difficulty of Paying Living Expenses: Not on file  Food Insecurity:   . Worried About Charity fundraiser in the Last Year: Not on file  . Ran Out of Food in the Last Year: Not on file  Transportation Needs:   . Lack of Transportation (Medical): Not on file  . Lack of Transportation (Non-Medical): Not on file  Physical Activity:   . Days of Exercise per Week: Not on file  . Minutes of Exercise per Session: Not on file  Stress:   . Feeling of Stress : Not on file  Social Connections:   . Frequency of Communication with Friends and Family: Not on file  . Frequency of Social Gatherings with Friends and Family: Not on file  . Attends Religious Services: Not on file  . Active Member of Clubs or Organizations: Not on file  . Attends Archivist Meetings: Not on file  . Marital Status: Not on file  Intimate Partner Violence:   . Fear of Current or Ex-Partner: Not on file  . Emotionally Abused: Not on file  . Physically Abused: Not on file  . Sexually Abused: Not on file      PHYSICAL EXAM  Vitals:   06/23/19 1314  BP: (!) 122/56  Pulse: (!) 58  Temp: (!) 97.5 F (36.4  C)  Weight: 185 lb 6.4 oz (84.1 kg)  Height: 5' (1.524 m)   Body mass index is 36.21 kg/m.  Generalized: Well developed, in no acute distress  Cardiology: normal rate and rhythm, no murmur noted Respiratory: Clear to auscultation bilaterally Neurological examination  Mentation: Alert oriented to time, place, history taking. Follows all commands speech and language fluent Cranial nerve II-XII: Pupils were equal round reactive to light. Extraocular movements were full, visual field were full  Motor: The motor testing reveals 5 over 5 strength of all 4 extremities. Good symmetric motor tone is noted throughout.  Gait and station: Gait is normal.   DIAGNOSTIC DATA (LABS, IMAGING, TESTING) - I reviewed patient records, labs, notes, testing and imaging myself where available.  No flowsheet data found.   Lab Results  Component Value Date   WBC 6.1 04/26/2019   HGB 12.0 04/26/2019   HCT 36.4 04/26/2019   MCV 85 04/26/2019   PLT 285 04/26/2019      Component Value Date/Time   NA 137 04/26/2019 1212   K 5.5 (H) 04/26/2019 1212   CL 99 04/26/2019 1212   CO2 22 04/26/2019 1212   GLUCOSE 75 04/26/2019 1212   GLUCOSE 88 04/25/2015 1706   BUN 10 04/26/2019 1212   CREATININE 0.80 04/26/2019 1212   CREATININE 1.09 (H) 04/25/2015 1706   CALCIUM 9.8 04/26/2019 1212   PROT 6.4 01/25/2019 0844   ALBUMIN 4.1 01/25/2019 0844   AST 14 01/25/2019 0844   ALT 13 01/25/2019 0844   ALKPHOS 116 01/25/2019 0844   BILITOT 0.4 01/25/2019 0844   GFRNONAA 74 04/26/2019 1212   GFRNONAA 52 (L) 04/25/2015 1706   GFRAA 85 04/26/2019 1212  GFRAA 60 04/25/2015 1706   Lab Results  Component Value Date   CHOL 179 12/04/2017   HDL 69 12/04/2017   LDLCALC 98 12/04/2017   TRIG 60 12/04/2017   CHOLHDL 2.6 12/04/2017   Lab Results  Component Value Date   HGBA1C 5.5 11/24/2016   No results found for: VITAMINB12 Lab Results  Component Value Date   TSH 3.410 01/25/2019       ASSESSMENT AND  PLAN 74 y.o. year old female  has a past medical history of Cataract, Essential hypertension, benign, GERD (gastroesophageal reflux disease), Hyperlipidemia, and Sleep apnea. here with     ICD-10-CM   1. OSA on CPAP  G47.33    Z99.89     Desma is doing very well with CPAP therapy at home. Compliance report reveals excellent compliance. We have discussed risk of untreated sleep apnea. She is motivated to continue using CPAP nightly and greater than 4 hours each night. We will follow-up with her in 6 months to ensure continued benefit with CPAP therapy. She verbalizes understanding and agreement with this plan.   No orders of the defined types were placed in this encounter.    No orders of the defined types were placed in this encounter.     I spent 15 minutes with the patient. 50% of this time was spent counseling and educating patient on plan of care and medications.    Debbora Presto, FNP-C 06/23/2019, 2:14 PM [facility] [address], [place], [place] [address] [phone]  I reviewed the above note and documentation by the Nurse Practitioner and agree with the history, exam, assessment and plan as outlined above. I was available for consultation. Star Age, MD, PhD [facility])

## 2019-06-23 NOTE — Patient Instructions (Signed)
Please continue CPAP nightly and greater than 4 hours each night   Follow up in 6 months  Sleep Apnea Sleep apnea affects breathing during sleep. It causes breathing to stop for a short time or to become shallow. It can also increase the risk of:  Heart attack.  Stroke.  Being very overweight (obese).  Diabetes.  Heart failure.  Irregular heartbeat. The goal of treatment is to help you breathe normally again. What are the causes? There are three kinds of sleep apnea:  Obstructive sleep apnea. This is caused by a blocked or collapsed airway.  Central sleep apnea. This happens when the brain does not send the right signals to the muscles that control breathing.  Mixed sleep apnea. This is a combination of obstructive and central sleep apnea. The most common cause of this condition is a collapsed or blocked airway. This can happen if:  Your throat muscles are too relaxed.  Your tongue and tonsils are too large.  You are overweight.  Your airway is too small. What increases the risk?  Being overweight.  Smoking.  Having a small airway.  Being older.  Being female.  Drinking alcohol.  Taking medicines to calm yourself (sedatives or tranquilizers).  Having family members with the condition. What are the signs or symptoms?  Trouble staying asleep.  Being sleepy or tired during the day.  Getting angry a lot.  Loud snoring.  Headaches in the morning.  Not being able to focus your mind (concentrate).  Forgetting things.  Less interest in sex.  Mood swings.  Personality changes.  Feelings of sadness (depression).  Waking up a lot during the night to pee (urinate).  Dry mouth.  Sore throat. How is this diagnosed?  Your medical history.  A physical exam.  A test that is done when you are sleeping (sleep study). The test is most often done in a sleep lab but may also be done at home. How is this treated?   Sleeping on your side.  Using a  medicine to get rid of mucus in your nose (decongestant).  Avoiding the use of alcohol, medicines to help you relax, or certain pain medicines (narcotics).  Losing weight, if needed.  Changing your diet.  Not smoking.  Using a machine to open your airway while you sleep, such as: ? An oral appliance. This is a mouthpiece that shifts your lower jaw forward. ? A CPAP device. This device blows air through a mask when you breathe out (exhale). ? An EPAP device. This has valves that you put in each nostril. ? A BPAP device. This device blows air through a mask when you breathe in (inhale) and breathe out.  Having surgery if other treatments do not work. It is important to get treatment for sleep apnea. Without treatment, it can lead to:  High blood pressure.  Coronary artery disease.  In men, not being able to have an erection (impotence).  Reduced thinking ability. Follow these instructions at home: Lifestyle  Make changes that your doctor recommends.  Eat a healthy diet.  Lose weight if needed.  Avoid alcohol, medicines to help you relax, and some pain medicines.  Do not use any products that contain nicotine or tobacco, such as cigarettes, e-cigarettes, and chewing tobacco. If you need help quitting, ask your doctor. General instructions  Take over-the-counter and prescription medicines only as told by your doctor.  If you were given a machine to use while you sleep, use it only as told by  your doctor.  If you are having surgery, make sure to tell your doctor you have sleep apnea. You may need to bring your device with you.  Keep all follow-up visits as told by your doctor. This is important. Contact a doctor if:  The machine that you were given to use during sleep bothers you or does not seem to be working.  You do not get better.  You get worse. Get help right away if:  Your chest hurts.  You have trouble breathing in enough air.  You have an uncomfortable  feeling in your back, arms, or stomach.  You have trouble talking.  One side of your body feels weak.  A part of your face is hanging down. These symptoms may be an emergency. Do not wait to see if the symptoms will go away. Get medical help right away. Call your local emergency services (911 in the [place] Do not drive yourself to the hospital. Summary  This condition affects breathing during sleep.  The most common cause is a collapsed or blocked airway.  The goal of treatment is to help you breathe normally while you sleep. This information is not intended to replace advice given to you by your health care provider. Make sure you discuss any questions you have with your health care provider. Document Revised: 03/19/2018 Document Reviewed: 01/26/2018 Elsevier Patient Education  [place].

## 2019-07-19 DIAGNOSIS — G4733 Obstructive sleep apnea (adult) (pediatric): Secondary | ICD-10-CM | POA: Diagnosis not present

## 2019-07-22 DIAGNOSIS — G4733 Obstructive sleep apnea (adult) (pediatric): Secondary | ICD-10-CM | POA: Diagnosis not present

## 2019-07-28 ENCOUNTER — Encounter: Payer: Self-pay | Admitting: Family Medicine

## 2019-07-28 ENCOUNTER — Other Ambulatory Visit: Payer: Self-pay

## 2019-07-28 ENCOUNTER — Ambulatory Visit (INDEPENDENT_AMBULATORY_CARE_PROVIDER_SITE_OTHER): Payer: Medicare HMO | Admitting: Family Medicine

## 2019-07-28 VITALS — BP 140/84 | HR 60 | Temp 97.4°F | Ht 60.0 in | Wt 187.8 lb

## 2019-07-28 DIAGNOSIS — E669 Obesity, unspecified: Secondary | ICD-10-CM

## 2019-07-28 DIAGNOSIS — F411 Generalized anxiety disorder: Secondary | ICD-10-CM | POA: Diagnosis not present

## 2019-07-28 DIAGNOSIS — I10 Essential (primary) hypertension: Secondary | ICD-10-CM | POA: Diagnosis not present

## 2019-07-28 DIAGNOSIS — I739 Peripheral vascular disease, unspecified: Secondary | ICD-10-CM | POA: Diagnosis not present

## 2019-07-28 DIAGNOSIS — E559 Vitamin D deficiency, unspecified: Secondary | ICD-10-CM | POA: Diagnosis not present

## 2019-07-28 NOTE — Patient Instructions (Signed)
     If you have lab work done today you will be contacted with your lab results within the next 2 weeks.  If you have not heard from us then please contact us. The fastest way to get your results is to register for My Chart.   IF you received an x-ray today, you will receive an invoice from [facility]. Please contact [facility] at [phone] with questions or concerns regarding your invoice.   IF you received labwork today, you will receive an invoice from [facility]. Please contact [facility] at [phone] with questions or concerns regarding your invoice.   Our billing staff will not be able to assist you with questions regarding bills from these companies.  You will be contacted with the lab results as soon as they are available. The fastest way to get your results is to activate your My Chart account. Instructions are located on the last page of this paperwork. If you have not heard from us regarding the results in 2 weeks, please contact this office.        If you have lab work done today you will be contacted with your lab results within the next 2 weeks.  If you have not heard from us then please contact us. The fastest way to get your results is to register for My Chart.   IF you received an x-ray today, you will receive an invoice from [facility]. Please contact [facility] at [phone] with questions or concerns regarding your invoice.   IF you received labwork today, you will receive an invoice from [facility]. Please contact [facility] at [phone] with questions or concerns regarding your invoice.   Our billing staff will not be able to assist you with questions regarding bills from these companies.  You will be contacted with the lab results as soon as they are available. The fastest way to get your results is to activate your My Chart account. Instructions are located on the last page of this paperwork. If you have not heard from us  regarding the results in 2 weeks, please contact this office.        If you have lab work done today you will be contacted with your lab results within the next 2 weeks.  If you have not heard from us then please contact us. The fastest way to get your results is to register for My Chart.   IF you received an x-ray today, you will receive an invoice from [facility]. Please contact [facility] at [phone] with questions or concerns regarding your invoice.   IF you received labwork today, you will receive an invoice from [facility]. Please contact [facility] at [phone] with questions or concerns regarding your invoice.   Our billing staff will not be able to assist you with questions regarding bills from these companies.  You will be contacted with the lab results as soon as they are available. The fastest way to get your results is to activate your My Chart account. Instructions are located on the last page of this paperwork. If you have not heard from us regarding the results in 2 weeks, please contact this office.     

## 2019-07-28 NOTE — Progress Notes (Signed)
2/11/202112:03 PM  Alicia Parker 08-01-1945, 74 y.o., female [id number]  Chief Complaint  Patient presents with  . Hypertension    HPI:   Patient is a 74 y.o. female with past medical history significant for HTN, GAD, GERD, colonic polyps, OSA, PAD, vitamin D deficiency who presents today for routine followup  Last OV nov 2020 - no changes Had stent to left superficial femoral artery placed nov 2020 - plavix and statin, Dr Gwenlyn Found Had routine fu with sleep jan 2021- doing well   She has no acute concerns today She completed vitamin d rx but has not started OTC  Her anxiety is much better, controlled  She is struggling with weight loss and lack of exercise opportunities Has stopped drinking beer   Wt Readings from Last 3 Encounters:  07/28/19 187 lb 12.8 oz (85.2 kg)  06/23/19 185 lb 6.4 oz (84.1 kg)  06/15/19 186 lb 9.6 oz (84.6 kg)    Depression screen [facility] 2/9 07/28/2019 04/01/2019 02/18/2019  Decreased Interest 0 0 2  Down, Depressed, Hopeless 0 0 1  PHQ - 2 Score 0 0 3  Altered sleeping - - 2  Tired, decreased energy - - 3  Change in appetite - - 1  Feeling bad or failure about yourself  - - 0  Trouble concentrating - - 0  Moving slowly or fidgety/restless - - 1  Suicidal thoughts - - 0  PHQ-9 Score - - 10    Fall Risk  07/28/2019 05/09/2019 04/01/2019 02/18/2019 01/25/2019  Falls in the past year? 0 0 0 0 0  Comment - - - - -  Number falls in past yr: 0 0 0 0 0  Injury with Fall? 0 0 0 0 0  Follow up - - - Falls evaluation completed -     No Known Allergies  Prior to Admission medications   Medication Sig Start Date End Date Taking? Authorizing Provider  amLODipine (NORVASC) 2.5 MG tablet Take 1 tablet (2.5 mg total) by mouth daily. 01/25/19  Yes Rutherford Guys, MD  aspirin EC 81 MG tablet Take 81 mg by mouth daily.   Yes [provider]  bismuth subsalicylate (PEPTO BISMOL) 262 MG/15ML suspension Take 30 mLs by mouth every 6 (six) hours as needed  for indigestion.   Yes [provider]  clopidogrel (PLAVIX) 75 MG tablet Take 1 tablet (75 mg total) by mouth daily. 06/01/19  Yes Lorretta Harp, MD  famotidine (PEPCID) 20 MG tablet TAKE 1 TABLET BY MOUTH EVERYDAY AT BEDTIME 04/27/19  Yes Rutherford Guys, MD  Ibuprofen (ADVIL) 200 MG CAPS Take by mouth. OTC PRN   Yes [provider]  loratadine (CLARITIN) 10 MG tablet Take 10 mg by mouth at bedtime.   Yes [provider]  naproxen sodium (ALEVE) 220 MG tablet Take 440 mg by mouth daily as needed (headaches).   Yes [provider]  Omega-3 Fatty Acids (OMEGA 3 PO) Take 2 capsules by mouth 3 (three) times daily with meals.   Yes [provider]  sertraline (ZOLOFT) 50 MG tablet Take 1.5 tablets (75 mg total) by mouth daily. 04/01/19  Yes Rutherford Guys, MD    Past Medical History:  Diagnosis Date  . Cataract   . Essential hypertension, benign   . GERD (gastroesophageal reflux disease)   . Hyperlipidemia    no current medications  . Sleep apnea    doesnt wear cpap     Past Surgical History:  Procedure Laterality Date  . ABDOMINAL AORTOGRAM W/LOWER EXTREMITY N/A 05/16/2019   Procedure: ABDOMINAL AORTOGRAM W/LOWER EXTREMITY;  Surgeon: Lorretta Harp, MD;  Location: [facility];  Service: Cardiovascular;  Laterality: N/A;  . APPENDECTOMY  1968  . CHOLECYSTECTOMY  1968  . COLONOSCOPY  2004   polyps  . PERIPHERAL VASCULAR INTERVENTION  05/16/2019   Procedure: PERIPHERAL VASCULAR INTERVENTION;  Surgeon: Lorretta Harp, MD;  Location: [facility];  Service: Cardiovascular;;  . REDUCTION MAMMAPLASTY Bilateral   . TOE SURGERY    . TUBAL LIGATION  1968  . UPPER GASTROINTESTINAL ENDOSCOPY      Social History   Tobacco Use  . Smoking status: Former Smoker    Packs/day: 1.00    Years: 10.00    Pack years: 10.00    Types: Cigarettes    Quit date: 11/2018    Years since quitting: 0.6  . Smokeless tobacco: Never  Used  Substance Use Topics  . Alcohol use: Yes    Alcohol/week: 6.0 standard drinks    Types: 6 Cans of beer per week    Comment: daily-     Family History  Problem Relation Age of Onset  . Hypertension Mother   . Stroke Mother   . Hypertension Father   . Leukemia Father 66  . Early death Brother        pneumonia  . Hypertension Maternal Grandmother   . Early death Brother   . Colon polyps Sister   . Colon cancer Neg Hx   . Esophageal cancer Neg Hx   . Rectal cancer Neg Hx   . Stomach cancer Neg Hx     Review of Systems  Constitutional: Negative for chills and fever.  Respiratory: Negative for cough and shortness of breath.   Cardiovascular: Negative for chest pain, palpitations, claudication and leg swelling.  Gastrointestinal: Negative for abdominal pain, nausea and vomiting.  Endo/Heme/Allergies: Does not bruise/bleed easily.     OBJECTIVE:  Today's Vitals   07/28/19 1131  BP: 140/84  Pulse: 60  Temp: (!) 97.4 F (36.3 C)  SpO2: 98%  Weight: 187 lb 12.8 oz (85.2 kg)  Height: 5' (1.524 m)   Body mass index is 36.68 kg/m.   Physical Exam Vitals and nursing note reviewed.  Constitutional:      Appearance: She is well-developed.  HENT:     Head: Normocephalic and atraumatic.     Mouth/Throat:     Pharynx: No oropharyngeal exudate.  Eyes:     General: No scleral icterus.    Conjunctiva/sclera: Conjunctivae normal.     Pupils: Pupils are equal, round, and reactive to light.  Cardiovascular:     Rate and Rhythm: Normal rate and regular rhythm.     Heart sounds: Normal heart sounds. No murmur. No friction rub. No gallop.   Pulmonary:     Effort: Pulmonary effort is normal.     Breath sounds: Normal breath sounds. No wheezing, rhonchi or rales.  Musculoskeletal:     Cervical back: Neck supple.     Right lower leg: No edema.     Left lower leg: No edema.  Skin:    General: Skin is warm and dry.  Neurological:     Mental Status: She is alert and  oriented to person, place, and time.     No results found for this or any previous visit (from the past 24 hour(s)).  No results found.   ASSESSMENT and PLAN  1. Essential hypertension  Controlled. Continue current regime.  Controlled. Continue current regime.  - Lipid panel - CMP14+EGFR  2. Vitamin D deficiency Checking labs today, medications will be adjusted as needed.  - VITAMIN D 25 Hydroxy (Vit-D Deficiency, Fractures)  3. Generalized anxiety disorder Controlled. Continue current regime.   4. Peripheral arterial disease ([place]) Controlled. Managed by vasc surg.   5. Obesity (BMI 30-39.9) Discussed specific strategies. Patient calorie goal of 1200 a day. Provided patient educational goal  Return in about 6 months (around 01/25/2020).    Rutherford Guys, MD Primary Care at [place], [place] [address] Ph.  [phone] Fax [phone]

## 2019-07-29 ENCOUNTER — Other Ambulatory Visit: Payer: Self-pay | Admitting: Family Medicine

## 2019-07-29 LAB — LIPID PANEL
Chol/HDL Ratio: 2.4 ratio (ref 0.0–4.4)
Cholesterol, Total: 195 mg/dL (ref 100–199)
HDL: 80 mg/dL (ref >39)
LDL Chol Calc (NIH): 104 mg/dL — ABNORMAL HIGH (ref 0–99)
Triglycerides: 57 mg/dL (ref 0–149)
VLDL Cholesterol Cal: 11 mg/dL (ref 5–40)

## 2019-07-29 LAB — CMP14+EGFR
ALT: 16 IU/L (ref 0–32)
AST: 20 IU/L (ref 0–40)
Albumin/Globulin Ratio: 1.7 (ref 1.2–2.2)
Albumin: 4.1 g/dL (ref 3.7–4.7)
Alkaline Phosphatase: 133 IU/L — ABNORMAL HIGH (ref 39–117)
BUN/Creatinine Ratio: 13 (ref 12–28)
BUN: 10 mg/dL (ref 8–27)
Bilirubin Total: 0.2 mg/dL (ref 0.0–1.2)
CO2: 21 mmol/L (ref 20–29)
Calcium: 9.2 mg/dL (ref 8.7–10.3)
Chloride: 101 mmol/L (ref 96–106)
Creatinine, Ser: 0.75 mg/dL (ref 0.57–1.00)
GFR calc Af Amer: 92 mL/min/{1.73_m2} (ref >59)
GFR calc non Af Amer: 80 mL/min/{1.73_m2} (ref >59)
Globulin, Total: 2.4 g/dL (ref 1.5–4.5)
Glucose: 79 mg/dL (ref 65–99)
Potassium: 4.9 mmol/L (ref 3.5–5.2)
Sodium: 137 mmol/L (ref 134–144)
Total Protein: 6.5 g/dL (ref 6.0–8.5)

## 2019-07-29 LAB — VITAMIN D 25 HYDROXY (VIT D DEFICIENCY, FRACTURES): Vit D, 25-Hydroxy: 16.8 ng/mL — ABNORMAL LOW (ref 30.0–100.0)

## 2019-08-06 ENCOUNTER — Ambulatory Visit: Payer: Medicare HMO

## 2019-08-13 ENCOUNTER — Other Ambulatory Visit: Payer: Self-pay | Admitting: Family Medicine

## 2019-08-13 ENCOUNTER — Encounter: Payer: Self-pay | Admitting: Family Medicine

## 2019-08-13 DIAGNOSIS — I739 Peripheral vascular disease, unspecified: Secondary | ICD-10-CM

## 2019-08-13 DIAGNOSIS — E785 Hyperlipidemia, unspecified: Secondary | ICD-10-CM

## 2019-08-13 DIAGNOSIS — E559 Vitamin D deficiency, unspecified: Secondary | ICD-10-CM

## 2019-08-13 MED ORDER — VITAMIN D (ERGOCALCIFEROL) 1.25 MG (50000 UNIT) PO CAPS: 50000.0000 [IU] | ORAL_CAPSULE | ORAL | 0 refills | Status: DC

## 2019-08-13 MED ORDER — ATORVASTATIN CALCIUM 20 MG PO TABS: 20.0000 mg | ORAL_TABLET | Freq: Every day | ORAL | 3 refills | Status: DC

## 2019-08-16 DIAGNOSIS — G4733 Obstructive sleep apnea (adult) (pediatric): Secondary | ICD-10-CM | POA: Diagnosis not present

## 2019-08-25 NOTE — Telephone Encounter (Signed)
Done

## 2019-09-06 ENCOUNTER — Telehealth (INDEPENDENT_AMBULATORY_CARE_PROVIDER_SITE_OTHER): Payer: Medicare HMO | Admitting: Family Medicine

## 2019-09-06 DIAGNOSIS — K219 Gastro-esophageal reflux disease without esophagitis: Secondary | ICD-10-CM

## 2019-09-06 DIAGNOSIS — M549 Dorsalgia, unspecified: Secondary | ICD-10-CM

## 2019-09-06 MED ORDER — FAMOTIDINE 20 MG PO TABS: 20.0000 mg | ORAL_TABLET | Freq: Two times a day (BID) | ORAL | 0 refills | Status: DC

## 2019-09-06 MED ORDER — CYCLOBENZAPRINE HCL 5 MG PO TABS: 5.0000 mg | ORAL_TABLET | Freq: Three times a day (TID) | ORAL | 0 refills | Status: DC | PRN

## 2019-09-06 NOTE — Progress Notes (Signed)
Virtual Visit Note  I connected with patient on 09/06/19 at 541pm by phone due to technical difficulties and verified that I am speaking with the correct person using two identifiers. Alicia Parker is currently located at home and patient is currently with them during visit. The provider, Rutherford Guys, MD is located in their office at time of visit.  I discussed the limitations, risks, security and privacy concerns of performing an evaluation and management service by telephone and the availability of in person appointments. I also discussed with the patient that there may be a patient responsible charge related to this service. The patient expressed understanding and agreed to proceed.   I provided 12 minutes of non-face-to-face time during this encounter.  Chief Complaint  Patient presents with  . GI Problem    pt is having pain in the back due to what she feels is reoccuring gas pain, taking 20mg  pepcid that aids somewhat. Took alka seilzer last night that really help with the pain, later realized after reading the bottle not to use with blood thinner.    HPI ? PMH: HTN, GAD, GERD, colonic polyps, OSA, PAD, vitamin D deficiency   Having back pain, bilateral, mid back Intermittent for past week Pain does not radiate Pain worse with movement Pain not worse with deep breathing She is feeling a bit gassy after she drank some beer Having no abd pain, nausea, vomiting, bloating, dysuria Denies any chest pain or SOB Constipation resolved with laxative Took an extra famotidine and alka-seltzer helped Walking also helped Today feeling better   No Known Allergies  Prior to Admission medications   Medication Sig Start Date End Date Taking? Authorizing Provider  amLODipine (NORVASC) 2.5 MG tablet Take 1 tablet (2.5 mg total) by mouth daily. 01/25/19  Yes Rutherford Guys, MD  aspirin EC 81 MG tablet Take 81 mg by mouth daily.   Yes [provider]  atorvastatin (LIPITOR)  20 MG tablet Take 1 tablet (20 mg total) by mouth daily. 08/13/19  Yes Rutherford Guys, MD  bismuth subsalicylate (PEPTO BISMOL) 262 MG/15ML suspension Take 30 mLs by mouth every 6 (six) hours as needed for indigestion.   Yes [provider]  clopidogrel (PLAVIX) 75 MG tablet Take 1 tablet (75 mg total) by mouth daily. 06/01/19  Yes Lorretta Harp, MD  famotidine (PEPCID) 20 MG tablet TAKE 1 TABLET BY MOUTH EVERYDAY AT BEDTIME 04/27/19  Yes Rutherford Guys, MD  Ibuprofen (ADVIL) 200 MG CAPS Take by mouth. OTC PRN   Yes [provider]  loratadine (CLARITIN) 10 MG tablet Take 10 mg by mouth at bedtime.   Yes [provider]  naproxen sodium (ALEVE) 220 MG tablet Take 440 mg by mouth daily as needed (headaches).   Yes [provider]  Omega-3 Fatty Acids (OMEGA 3 PO) Take 2 capsules by mouth 3 (three) times daily with meals.   Yes [provider]  sertraline (ZOLOFT) 50 MG tablet Take 1.5 tablets (75 mg total) by mouth daily. 04/01/19  Yes Rutherford Guys, MD  Vitamin D, Ergocalciferol, (DRISDOL) 1.25 MG (50000 UNIT) CAPS capsule Take 1 capsule (50,000 Units total) by mouth every 7 (seven) days. 08/13/19  Yes Rutherford Guys, MD    Past Medical History:  Diagnosis Date  . Cataract   . Essential hypertension, benign   . GERD (gastroesophageal reflux disease)   . Hyperlipidemia    no current medications  . PAD (peripheral artery disease) ([place])  2020  . Sleep apnea    doesnt wear cpap     Past Surgical History:  Procedure Laterality Date  . ABDOMINAL AORTOGRAM W/LOWER EXTREMITY N/A 05/16/2019   Procedure: ABDOMINAL AORTOGRAM W/LOWER EXTREMITY;  Surgeon: Lorretta Harp, MD;  Location: [facility];  Service: Cardiovascular;  Laterality: N/A;  . APPENDECTOMY  1968  . CHOLECYSTECTOMY  1968  . COLONOSCOPY  2004   polyps  . PERIPHERAL VASCULAR INTERVENTION  05/16/2019   Procedure: PERIPHERAL VASCULAR INTERVENTION;  Surgeon: Lorretta Harp, MD;  Location: [facility];  Service: Cardiovascular;;  . REDUCTION MAMMAPLASTY Bilateral   . TOE SURGERY    . TUBAL LIGATION  1968  . UPPER GASTROINTESTINAL ENDOSCOPY      Social History   Tobacco Use  . Smoking status: Former Smoker    Packs/day: 1.00    Years: 10.00    Pack years: 10.00    Types: Cigarettes    Quit date: 11/2018    Years since quitting: 0.8  . Smokeless tobacco: Never Used  Substance Use Topics  . Alcohol use: Yes    Alcohol/week: 6.0 standard drinks    Types: 6 Cans of beer per week    Comment: daily-     Family History  Problem Relation Age of Onset  . Hypertension Mother   . Stroke Mother   . Hypertension Father   . Leukemia Father 56  . Early death Brother        pneumonia  . Hypertension Maternal Grandmother   . Early death Brother   . Colon polyps Sister   . Colon cancer Neg Hx   . Esophageal cancer Neg Hx   . Rectal cancer Neg Hx   . Stomach cancer Neg Hx     ROS Per hpi  Objective  Vitals as reported by the patient: none   ASSESSMENT and PLAN  1. Mid back pain Difficult to assess over phone, history suggestive MSK, discussed low dose flexeril, reviewed r/se/b, gentle stretching  2. Gastroesophageal reflux disease without esophagitis Not controlled. Increase pepcid BID, discussed dietary triggers  Other orders - famotidine (PEPCID) 20 MG tablet; Take 1 tablet (20 mg total) by mouth 2 (two) times daily. - cyclobenzaprine (FLEXERIL) 5 MG tablet; Take 1 tablet (5 mg total) by mouth 3 (three) times daily as needed for muscle spasms.  FOLLOW-UP: prn   The above assessment and management plan was discussed with the patient. The patient verbalized understanding of and has agreed to the management plan. Patient is aware to call the clinic if symptoms persist or worsen. Patient is aware when to return to the clinic for a follow-up visit. Patient educated on when it is appropriate to go to the emergency department.      Rutherford Guys, MD Primary Care at [place], [place] [address] Ph.  [phone] Fax [phone]

## 2019-09-11 ENCOUNTER — Other Ambulatory Visit: Payer: Self-pay

## 2019-09-11 ENCOUNTER — Encounter (HOSPITAL_COMMUNITY): Payer: Self-pay | Admitting: Emergency Medicine

## 2019-09-11 ENCOUNTER — Emergency Department (HOSPITAL_COMMUNITY): Payer: Medicare HMO

## 2019-09-11 ENCOUNTER — Emergency Department (HOSPITAL_COMMUNITY)
Admission: EM | Admit: 2019-09-11 | Discharge: 2019-09-11 | Disposition: A | Discharge status: Home / Self Care | Payer: Medicare HMO | Attending: Emergency Medicine | Admitting: Emergency Medicine

## 2019-09-11 DIAGNOSIS — Z87891 Personal history of nicotine dependence: Secondary | ICD-10-CM | POA: Insufficient documentation

## 2019-09-11 DIAGNOSIS — I10 Essential (primary) hypertension: Secondary | ICD-10-CM | POA: Insufficient documentation

## 2019-09-11 DIAGNOSIS — Z79899 Other long term (current) drug therapy: Secondary | ICD-10-CM | POA: Insufficient documentation

## 2019-09-11 DIAGNOSIS — M546 Pain in thoracic spine: Secondary | ICD-10-CM | POA: Diagnosis not present

## 2019-09-11 DIAGNOSIS — K5790 Diverticulosis of intestine, part unspecified, without perforation or abscess without bleeding: Secondary | ICD-10-CM | POA: Diagnosis not present

## 2019-09-11 DIAGNOSIS — Z7982 Long term (current) use of aspirin: Secondary | ICD-10-CM | POA: Insufficient documentation

## 2019-09-11 DIAGNOSIS — R109 Unspecified abdominal pain: Secondary | ICD-10-CM | POA: Insufficient documentation

## 2019-09-11 LAB — CBC WITH DIFFERENTIAL/PLATELET
Abs Immature Granulocytes: 0.02 10*3/uL (ref 0.00–0.07)
Basophils Absolute: 0 10*3/uL (ref 0.0–0.1)
Basophils Relative: 1 %
Eosinophils Absolute: 0.1 10*3/uL (ref 0.0–0.5)
Eosinophils Relative: 2 %
HCT: 39.4 % (ref 36.0–46.0)
Hemoglobin: 12 g/dL (ref 12.0–15.0)
Immature Granulocytes: 0 %
Lymphocytes Relative: 32 %
Lymphs Abs: 1.7 10*3/uL (ref 0.7–4.0)
MCH: 25.9 pg — ABNORMAL LOW (ref 26.0–34.0)
MCHC: 30.5 g/dL (ref 30.0–36.0)
MCV: 85.1 fL (ref 80.0–100.0)
Monocytes Absolute: 0.5 10*3/uL (ref 0.1–1.0)
Monocytes Relative: 10 %
Neutro Abs: 2.9 10*3/uL (ref 1.7–7.7)
Neutrophils Relative %: 55 %
Platelets: 287 10*3/uL (ref 150–400)
RBC: 4.63 MIL/uL (ref 3.87–5.11)
RDW: 14.6 % (ref 11.5–15.5)
WBC: 5.2 10*3/uL (ref 4.0–10.5)
nRBC: 0 % (ref 0.0–0.2)

## 2019-09-11 LAB — COMPREHENSIVE METABOLIC PANEL
ALT: 20 U/L (ref 0–44)
AST: 18 U/L (ref 15–41)
Albumin: 3.8 g/dL (ref 3.5–5.0)
Alkaline Phosphatase: 92 U/L (ref 38–126)
Anion gap: 12 (ref 5–15)
BUN: 14 mg/dL (ref 8–23)
CO2: 23 mmol/L (ref 22–32)
Calcium: 9.6 mg/dL (ref 8.9–10.3)
Chloride: 103 mmol/L (ref 98–111)
Creatinine, Ser: 0.77 mg/dL (ref 0.44–1.00)
GFR calc Af Amer: >60 mL/min (ref >60)
GFR calc non Af Amer: >60 mL/min (ref >60)
Glucose, Bld: 91 mg/dL (ref 70–99)
Potassium: 4.4 mmol/L (ref 3.5–5.1)
Sodium: 138 mmol/L (ref 135–145)
Total Bilirubin: 0.6 mg/dL (ref 0.3–1.2)
Total Protein: 7.1 g/dL (ref 6.5–8.1)

## 2019-09-11 LAB — URINALYSIS, ROUTINE W REFLEX MICROSCOPIC
Bilirubin Urine: NEGATIVE
Glucose, UA: NEGATIVE mg/dL
Hgb urine dipstick: NEGATIVE
Ketones, ur: NEGATIVE mg/dL
Leukocytes,Ua: NEGATIVE
Nitrite: NEGATIVE
Protein, ur: NEGATIVE mg/dL
Specific Gravity, Urine: 1.005 (ref 1.005–1.030)
pH: 5 (ref 5.0–8.0)

## 2019-09-11 LAB — LIPASE, BLOOD: Lipase: 20 U/L (ref 11–51)

## 2019-09-11 LAB — D-DIMER, QUANTITATIVE: D-Dimer, Quant: 0.41 ug/mL-FEU (ref 0.00–0.50)

## 2019-09-11 NOTE — ED Triage Notes (Addendum)
C/o thoracic and lower back pain x 1 1/2 weeks.  No known injury.  States she is taking muscle relaxer per PCP that is helping but not relieving pain.

## 2019-09-11 NOTE — ED Provider Notes (Signed)
[facility] EMERGENCY DEPARTMENT Provider Note   CSN: [id number] Arrival date & time: 09/11/19  1212     History Chief Complaint  Patient presents with  . Back Pain    Alicia Parker is a 74 y.o. female.  The history is provided by the patient and medical records. No language interpreter was used.  Back Pain  Alicia Parker is a 74 y.o. female who presents to the Emergency Department complaining of back pain. She complains of one and a half weeks of thoracic back pain across the lower thoracic region. It is bilateral but mostly on the right side of her back. It is described as a soreness sensation that waxes and wanes. She is unclear of alleviating or worsening factors. She does state that is often worse with activity but not consistently reproducible with activity or movement. She does have occasional shortness of breath and feels like she can't take a deep breath when she has the pain occur but she does not have persistent shortness of breath. She denies any fevers, nausea, vomiting, abdominal pain, dysuria, changing urination, leg swelling or pain. No prior similar symptoms. She was seen in urgent care and treated with Flexeril, which has not changed her symptoms at all. She has a history of peripheral vascular disease, hypertension, hyperlipidemia.    Past Medical History:  Diagnosis Date  . Cataract   . Essential hypertension, benign   . GERD (gastroesophageal reflux disease)   . Hyperlipidemia    no current medications  . PAD (peripheral artery disease) ([place]) 2020  . Sleep apnea    doesnt wear cpap     Patient Active Problem List   Diagnosis Date Noted  . OSA on CPAP 05/09/2019  . Generalized anxiety disorder 05/09/2019  . Peripheral arterial disease ([place]) 04/26/2019  . Essential hypertension 04/26/2019  . Elevated LDL cholesterol level 10/18/2011  . Lumbar spondylosis 10/18/2011  . Obesity (BMI 30-39.9) 10/18/2011  . Hx of gastroesophageal reflux  (GERD) 10/18/2011  . Bowens disease 10/18/2011  . History of colon polyps 10/18/2011    Past Surgical History:  Procedure Laterality Date  . ABDOMINAL AORTOGRAM W/LOWER EXTREMITY N/A 05/16/2019   Procedure: ABDOMINAL AORTOGRAM W/LOWER EXTREMITY;  Surgeon: Lorretta Harp, MD;  Location: [facility];  Service: Cardiovascular;  Laterality: N/A;  . APPENDECTOMY  1968  . CHOLECYSTECTOMY  1968  . COLONOSCOPY  2004   polyps  . PERIPHERAL VASCULAR INTERVENTION  05/16/2019   Procedure: PERIPHERAL VASCULAR INTERVENTION;  Surgeon: Lorretta Harp, MD;  Location: [facility];  Service: Cardiovascular;;  . REDUCTION MAMMAPLASTY Bilateral   . TOE SURGERY    . TUBAL LIGATION  1968  . UPPER GASTROINTESTINAL ENDOSCOPY       OB History    Gravida  3   Para  3   Term  3   Preterm      AB      Living  3     SAB      TAB      Ectopic      Multiple      Live Births  3           Family History  Problem Relation Age of Onset  . Hypertension Mother   . Stroke Mother   . Hypertension Father   . Leukemia Father 33  . Early death Brother        pneumonia  . Hypertension Maternal Grandmother   . Early  death Brother   . Colon polyps Sister   . Colon cancer Neg Hx   . Esophageal cancer Neg Hx   . Rectal cancer Neg Hx   . Stomach cancer Neg Hx     Social History   Tobacco Use  . Smoking status: Former Smoker    Packs/day: 1.00    Years: 10.00    Pack years: 10.00    Types: Cigarettes    Quit date: 11/2018    Years since quitting: 0.8  . Smokeless tobacco: Never Used  Substance Use Topics  . Alcohol use: Yes    Alcohol/week: 6.0 standard drinks    Types: 6 Cans of beer per week    Comment: daily-   . Drug use: No    Home Medications Prior to Admission medications   Medication Sig Start Date End Date Taking? Authorizing Provider  amLODipine (NORVASC) 2.5 MG tablet Take 1 tablet (2.5 mg total) by mouth daily. 01/25/19   Rutherford Guys, MD    aspirin EC 81 MG tablet Take 81 mg by mouth daily.    [provider]  atorvastatin (LIPITOR) 20 MG tablet Take 1 tablet (20 mg total) by mouth daily. 08/13/19   Rutherford Guys, MD  bismuth subsalicylate (PEPTO BISMOL) 262 MG/15ML suspension Take 30 mLs by mouth every 6 (six) hours as needed for indigestion.    [provider]  clopidogrel (PLAVIX) 75 MG tablet Take 1 tablet (75 mg total) by mouth daily. 06/01/19   Lorretta Harp, MD  cyclobenzaprine (FLEXERIL) 5 MG tablet Take 1 tablet (5 mg total) by mouth 3 (three) times daily as needed for muscle spasms. 09/06/19   Rutherford Guys, MD  famotidine (PEPCID) 20 MG tablet Take 1 tablet (20 mg total) by mouth 2 (two) times daily. 09/06/19   Rutherford Guys, MD  Ibuprofen (ADVIL) 200 MG CAPS Take by mouth. OTC PRN    [provider]  loratadine (CLARITIN) 10 MG tablet Take 10 mg by mouth at bedtime.    [provider]  naproxen sodium (ALEVE) 220 MG tablet Take 440 mg by mouth daily as needed (headaches).    [provider]  Omega-3 Fatty Acids (OMEGA 3 PO) Take 2 capsules by mouth 3 (three) times daily with meals.    [provider]  sertraline (ZOLOFT) 50 MG tablet Take 1.5 tablets (75 mg total) by mouth daily. 04/01/19   Rutherford Guys, MD  Vitamin D, Ergocalciferol, (DRISDOL) 1.25 MG (50000 UNIT) CAPS capsule Take 1 capsule (50,000 Units total) by mouth every 7 (seven) days. 08/13/19   Rutherford Guys, MD    Allergies    Patient has no known allergies.  Review of Systems   Review of Systems  Musculoskeletal: Positive for back pain.  All other systems reviewed and are negative.   Physical Exam Updated Vital Signs BP (!) 153/70 (BP Location: Right Arm)   Pulse (!) 55   Temp 98.1 F (36.7 C) (Oral)   Resp (!) 22   Ht 5' (1.524 m)   Wt 84.8 kg   LMP 06/16/1997   SpO2 100%   BMI 36.52 kg/m   Physical Exam Vitals and nursing note reviewed.  Constitutional:       Appearance: She is well-developed.  HENT:     Head: Normocephalic and atraumatic.  Cardiovascular:     Rate and Rhythm: Normal rate and regular rhythm.     Heart sounds: No murmur.  Pulmonary:  Effort: Pulmonary effort is normal. No respiratory distress.     Breath sounds: Normal breath sounds.  Abdominal:     Palpations: Abdomen is soft.     Tenderness: There is no abdominal tenderness. There is no guarding or rebound.  Musculoskeletal:        General: No tenderness.     Comments: No tenderness to palpation over the cervical, thoracic, lumbar spine. No CVA tenderness  Skin:    General: Skin is warm and dry.  Neurological:     Mental Status: She is alert and oriented to person, place, and time.     Comments: Five out of five strength in all four extremities with sensation light touch intact in all four extremities  Psychiatric:        Behavior: Behavior normal.     ED Results / Procedures / Treatments   Labs (all labs ordered are listed, but only abnormal results are displayed) Labs Reviewed  CBC WITH DIFFERENTIAL/PLATELET - Abnormal; Notable for the following components:      Result Value   MCH 25.9 (*)    All other components within normal limits  URINE CULTURE  COMPREHENSIVE METABOLIC PANEL  LIPASE, BLOOD  D-DIMER, QUANTITATIVE (NOT AT [facility])  URINALYSIS, ROUTINE W REFLEX MICROSCOPIC    EKG EKG Interpretation  Date/Time:  Sunday September 11 2019 14:59:40 EDT Ventricular Rate:  58 PR Interval:    QRS Duration: 99 QT Interval:  452 QTC Calculation: 444 R Axis:   21 Text Interpretation: Sinus rhythm Borderline prolonged PR interval Low voltage, precordial leads Confirmed by Virgel Manifold [phone]) on 09/11/2019 3:19:34 PM   Radiology DG Chest 2 View  Result Date: 09/11/2019 CLINICAL DATA:  Thoracic back pain and low back pain for 1.5 weeks. No known injury. EXAM: CHEST - 2 VIEW COMPARISON:  Chest x-ray dated 10/02/2016 FINDINGS: The heart size and mediastinal  contours are within normal limits. Both lungs are clear. The visualized skeletal structures are unremarkable. IMPRESSION: Normal exam. Specifically, no appreciable abnormality of the thoracic spine. Electronically Signed   By: Lorriane Shire M.D.   On: 09/11/2019 15:26    Procedures Procedures (including critical care time)  Medications Ordered in ED Medications - No data to display  ED Course  I have reviewed the triage vital signs and the nursing notes.  Pertinent labs & imaging results that were available during my care of the patient were reviewed by me and considered in my medical decision making (see chart for details).    MDM Rules/Calculators/A&P                     Patient here for evaluation of a week and a half of atraumatic mid back/flank pain. She is neurologically intact on evaluation. Labs demonstrate normal renal function. Patient care transferred pending CT stone study, UA.  Final Clinical Impression(s) / ED Diagnoses Final diagnoses:  None    Rx / DC Orders ED Discharge Orders    None       Quintella Reichert, MD 09/11/19 1644

## 2019-09-12 LAB — URINE CULTURE

## 2019-09-13 ENCOUNTER — Encounter: Payer: Self-pay | Admitting: Family Medicine

## 2019-09-13 ENCOUNTER — Ambulatory Visit (INDEPENDENT_AMBULATORY_CARE_PROVIDER_SITE_OTHER): Payer: Medicare HMO | Admitting: Family Medicine

## 2019-09-13 ENCOUNTER — Other Ambulatory Visit: Payer: Self-pay

## 2019-09-13 VITALS — BP 133/64 | HR 62 | Temp 98.0°F | Ht 60.0 in | Wt 187.0 lb

## 2019-09-13 DIAGNOSIS — M549 Dorsalgia, unspecified: Secondary | ICD-10-CM

## 2019-09-13 DIAGNOSIS — Z86010 Personal history of colonic polyps: Secondary | ICD-10-CM | POA: Diagnosis not present

## 2019-09-13 MED ORDER — METHOCARBAMOL 500 MG PO TABS: 500.0000 mg | ORAL_TABLET | Freq: Four times a day (QID) | ORAL | 3 refills | Status: DC | PRN

## 2019-09-13 NOTE — Progress Notes (Signed)
3/30/20216:24 PM  Alicia Parker Jan 20, 1946, 74 y.o., female [id number]  Chief Complaint  Patient presents with  . Back Pain    Bilateral thoracic area , today more painful on R side , R thigh and leg pain    HPI:   Patient is a 74 y.o. female with past medical history significant for HTN, PAD, HLP, GAD, vitamin D deficiency, OSA on cpap who presents today for ER followup  Seen in ER on March 28th for thoracic back pain not responding to flexeril Normal CBC, CMP, lipase, d dimer, UA ekg stable normal CXR CT stone - no stones, diverticulosis, lumbosacral spondylosis, moderate bladder distention  Patient states that back pain seemed to be getting better but then this morning it started again She reports pain at right flank She has been using OTC patches, heating pad Flexeril does not help  Pain tends to be aggravated by certain movements Currently not bothering her  She is also due for colonoscopy She was supposed to have it done last year but it was postponed due to covid  Depression screen [facility] 2/9 09/13/2019 07/28/2019 04/01/2019  Decreased Interest 0 0 0  Down, Depressed, Hopeless 0 0 0  PHQ - 2 Score 0 0 0  Altered sleeping - - -  Tired, decreased energy - - -  Change in appetite - - -  Feeling bad or failure about yourself  - - -  Trouble concentrating - - -  Moving slowly or fidgety/restless - - -  Suicidal thoughts - - -  PHQ-9 Score - - -    Fall Risk  09/13/2019 07/28/2019 05/09/2019 04/01/2019 02/18/2019  Falls in the past year? 0 0 0 0 0  Comment - - - - -  Number falls in past yr: 0 0 0 0 0  Injury with Fall? 0 0 0 0 0  Follow up Falls evaluation completed - - - Falls evaluation completed     No Known Allergies  Prior to Admission medications   Medication Sig Start Date End Date Taking? Authorizing Provider  amLODipine (NORVASC) 2.5 MG tablet Take 1 tablet (2.5 mg total) by mouth daily. 01/25/19  Yes Rutherford Guys, MD  aspirin EC 81 MG tablet Take  81 mg by mouth daily.   Yes [provider]  atorvastatin (LIPITOR) 20 MG tablet Take 1 tablet (20 mg total) by mouth daily. 08/13/19  Yes Rutherford Guys, MD  bismuth subsalicylate (PEPTO BISMOL) 262 MG/15ML suspension Take 30 mLs by mouth every 6 (six) hours as needed for indigestion.   Yes [provider]  clopidogrel (PLAVIX) 75 MG tablet Take 1 tablet (75 mg total) by mouth daily. 06/01/19  Yes Lorretta Harp, MD  famotidine (PEPCID) 20 MG tablet Take 1 tablet (20 mg total) by mouth 2 (two) times daily. 09/06/19  Yes Rutherford Guys, MD  Ibuprofen (ADVIL) 200 MG CAPS Take by mouth. OTC PRN   Yes [provider]  loratadine (CLARITIN) 10 MG tablet Take 10 mg by mouth at bedtime.   Yes [provider]  naproxen sodium (ALEVE) 220 MG tablet Take 440 mg by mouth daily as needed (headaches).   Yes [provider]  Omega-3 Fatty Acids (OMEGA 3 PO) Take 2 capsules by mouth 3 (three) times daily with meals.   Yes [provider]  sertraline (ZOLOFT) 50 MG tablet Take 1.5 tablets (75 mg total) by mouth daily. 04/01/19  Yes Rutherford Guys, MD  Vitamin D, Ergocalciferol, (  DRISDOL) 1.25 MG (50000 UNIT) CAPS capsule Take 1 capsule (50,000 Units total) by mouth every 7 (seven) days. 08/13/19  Yes Rutherford Guys, MD    Past Medical History:  Diagnosis Date  . Cataract   . Essential hypertension, benign   . GERD (gastroesophageal reflux disease)   . Hyperlipidemia    no current medications  . PAD (peripheral artery disease) ([place]) 2020  . Sleep apnea    doesnt wear cpap     Past Surgical History:  Procedure Laterality Date  . ABDOMINAL AORTOGRAM W/LOWER EXTREMITY N/A 05/16/2019   Procedure: ABDOMINAL AORTOGRAM W/LOWER EXTREMITY;  Surgeon: Lorretta Harp, MD;  Location: [place] [facility];  Service: Cardiovascular;  Laterality: N/A;  . APPENDECTOMY  1968  . CHOLECYSTECTOMY  1968  . COLONOSCOPY  2004   polyps  . PERIPHERAL VASCULAR  INTERVENTION  05/16/2019   Procedure: PERIPHERAL VASCULAR INTERVENTION;  Surgeon: Lorretta Harp, MD;  Location: [place] [facility];  Service: Cardiovascular;;  . REDUCTION MAMMAPLASTY Bilateral   . TOE SURGERY    . TUBAL LIGATION  1968  . UPPER GASTROINTESTINAL ENDOSCOPY      Social History   Tobacco Use  . Smoking status: Former Smoker    Packs/day: 1.00    Years: 10.00    Pack years: 10.00    Types: Cigarettes    Quit date: 11/2018    Years since quitting: 0.8  . Smokeless tobacco: Never Used  Substance Use Topics  . Alcohol use: Yes    Alcohol/week: 6.0 standard drinks    Types: 6 Cans of beer per week    Comment: daily-     Family History  Problem Relation Age of Onset  . Hypertension Mother   . Stroke Mother   . Hypertension Father   . Leukemia Father 84  . Early death Brother        pneumonia  . Hypertension Maternal Grandmother   . Early death Brother   . Colon polyps Sister   . Colon cancer Neg Hx   . Esophageal cancer Neg Hx   . Rectal cancer Neg Hx   . Stomach cancer Neg Hx     ROS Per hpi  OBJECTIVE:  Today's Vitals   09/13/19 1532  BP: 133/64  Pulse: 62  Temp: 98 F (36.7 C)  SpO2: 99%  Weight: 187 lb (84.8 kg)  Height: 5' (1.524 m)   Body mass index is 36.52 kg/m.   Physical Exam Vitals and nursing note reviewed.  Constitutional:      Appearance: She is well-developed.  HENT:     Head: Normocephalic and atraumatic.     Mouth/Throat:     Pharynx: No oropharyngeal exudate.  Eyes:     General: No scleral icterus.    Conjunctiva/sclera: Conjunctivae normal.     Pupils: Pupils are equal, round, and reactive to light.  Cardiovascular:     Rate and Rhythm: Normal rate and regular rhythm.     Heart sounds: Normal heart sounds. No murmur. No friction rub. No gallop.   Pulmonary:     Effort: Pulmonary effort is normal.     Breath sounds: Normal breath sounds. No wheezing or rales.  Musculoskeletal:     Cervical back: Neck supple.      Thoracic back: No swelling, spasms, tenderness or bony tenderness. Normal range of motion.  Skin:    General: Skin is warm and dry.  Neurological:     Mental Status: She is alert and oriented  to person, place, and time.     No results found for this or any previous visit (from the past 24 hour(s)).  No results found.   ASSESSMENT and PLAN  1. Mid back pain MSK in nature, Discussed supportive measures, new meds r/se/b and RTC precautions.  - methocarbamol 500mg  PO q 6 hours prn  2. History of colon polyps - Ambulatory referral to Gastroenterology  Return if symptoms worsen or fail to improve.    Rutherford Guys, MD Primary Care at [facility], [place] [address] Ph.  [phone] Fax [phone]

## 2019-09-13 NOTE — Patient Instructions (Signed)
° ° ° °  If you have lab work done today you will be contacted with your lab results within the next 2 weeks.  If you have not heard from us then please contact us. The fastest way to get your results is to register for My Chart. ° ° °IF you received an x-ray today, you will receive an invoice from [facility]. Please contact [facility] at [phone] with questions or concerns regarding your invoice.  ° °IF you received labwork today, you will receive an invoice from [facility]. Please contact [facility] at [phone] with questions or concerns regarding your invoice.  ° °Our billing staff will not be able to assist you with questions regarding bills from these companies. ° °You will be contacted with the lab results as soon as they are available. The fastest way to get your results is to activate your My Chart account. Instructions are located on the last page of this paperwork. If you have not heard from us regarding the results in 2 weeks, please contact this office. °  ° ° ° °

## 2019-09-14 DIAGNOSIS — G4733 Obstructive sleep apnea (adult) (pediatric): Secondary | ICD-10-CM | POA: Diagnosis not present

## 2019-09-16 DIAGNOSIS — G4733 Obstructive sleep apnea (adult) (pediatric): Secondary | ICD-10-CM | POA: Diagnosis not present

## 2019-10-04 DIAGNOSIS — H524 Presbyopia: Secondary | ICD-10-CM | POA: Diagnosis not present

## 2019-10-04 DIAGNOSIS — Z0100 Encounter for examination of eyes and vision without abnormal findings: Secondary | ICD-10-CM | POA: Diagnosis not present

## 2019-10-04 DIAGNOSIS — H2513 Age-related nuclear cataract, bilateral: Secondary | ICD-10-CM | POA: Diagnosis not present

## 2019-10-16 DIAGNOSIS — G4733 Obstructive sleep apnea (adult) (pediatric): Secondary | ICD-10-CM | POA: Diagnosis not present

## 2019-10-28 ENCOUNTER — Other Ambulatory Visit: Payer: Self-pay | Admitting: Family Medicine

## 2019-10-28 NOTE — Telephone Encounter (Signed)
Patient is requesting a refill of the following medications: Requested Prescriptions   Pending Prescriptions Disp Refills  . Vitamin D, Ergocalciferol, (DRISDOL) 1.25 MG (50000 UNIT) CAPS capsule [Pharmacy Med Name: VITAMIN D2 1.25MG (50,000 UNIT)] 12 capsule 0    Sig: TAKE 1 CAPSULE (50,000 UNITS TOTAL) BY MOUTH EVERY 7 (SEVEN) DAYS.    Date of patient request:  10/28/2019 Last office visit: 09/13/2019 Date of last refill:08/13/2019  Last refill amount: 12 Capsules  Follow up time period per chart: 02/02/2020

## 2019-11-03 ENCOUNTER — Other Ambulatory Visit: Payer: Self-pay | Admitting: Family Medicine

## 2019-11-03 DIAGNOSIS — F411 Generalized anxiety disorder: Secondary | ICD-10-CM

## 2019-11-16 DIAGNOSIS — G4733 Obstructive sleep apnea (adult) (pediatric): Secondary | ICD-10-CM | POA: Diagnosis not present

## 2019-11-24 ENCOUNTER — Telehealth: Payer: Self-pay | Admitting: Cardiovascular Disease

## 2019-11-24 NOTE — Telephone Encounter (Signed)
LMOM RE: F/U Appt 

## 2019-11-28 ENCOUNTER — Other Ambulatory Visit: Payer: Self-pay | Admitting: Family Medicine

## 2019-11-30 ENCOUNTER — Other Ambulatory Visit: Payer: Self-pay

## 2019-11-30 ENCOUNTER — Ambulatory Visit (HOSPITAL_COMMUNITY)
Admission: RE | Admit: 2019-11-30 | Discharge: 2019-11-30 | Disposition: A | Discharge status: Home / Self Care | Payer: Medicare HMO | Source: Ambulatory Visit | Attending: Cardiovascular Disease | Admitting: Cardiovascular Disease

## 2019-11-30 DIAGNOSIS — I739 Peripheral vascular disease, unspecified: Secondary | ICD-10-CM | POA: Diagnosis not present

## 2019-11-30 DIAGNOSIS — Z95820 Peripheral vascular angioplasty status with implants and grafts: Secondary | ICD-10-CM | POA: Insufficient documentation

## 2019-12-02 ENCOUNTER — Telehealth: Payer: Self-pay

## 2019-12-02 DIAGNOSIS — I739 Peripheral vascular disease, unspecified: Secondary | ICD-10-CM

## 2019-12-02 DIAGNOSIS — Z95820 Peripheral vascular angioplasty status with implants and grafts: Secondary | ICD-10-CM

## 2019-12-02 NOTE — Telephone Encounter (Signed)
Spoke to patient lower ext doppler results given.Advised to repeat in 1 year.

## 2019-12-07 ENCOUNTER — Other Ambulatory Visit: Payer: Self-pay

## 2019-12-07 DIAGNOSIS — E7800 Pure hypercholesterolemia, unspecified: Secondary | ICD-10-CM

## 2019-12-07 DIAGNOSIS — E669 Obesity, unspecified: Secondary | ICD-10-CM

## 2019-12-07 DIAGNOSIS — K219 Gastro-esophageal reflux disease without esophagitis: Secondary | ICD-10-CM

## 2019-12-07 DIAGNOSIS — I10 Essential (primary) hypertension: Secondary | ICD-10-CM

## 2019-12-07 MED ORDER — AMLODIPINE BESYLATE 2.5 MG PO TABS: 2.5000 mg | ORAL_TABLET | Freq: Every day | ORAL | 1 refills | Status: DC

## 2019-12-07 MED ORDER — FAMOTIDINE 20 MG PO TABS: 20.0000 mg | ORAL_TABLET | Freq: Two times a day (BID) | ORAL | 0 refills | Status: DC

## 2019-12-07 MED ORDER — ATORVASTATIN CALCIUM 20 MG PO TABS: 20.0000 mg | ORAL_TABLET | Freq: Every day | ORAL | 1 refills | Status: DC

## 2019-12-09 ENCOUNTER — Other Ambulatory Visit: Payer: Self-pay

## 2019-12-09 DIAGNOSIS — F411 Generalized anxiety disorder: Secondary | ICD-10-CM

## 2019-12-09 MED ORDER — SERTRALINE HCL 50 MG PO TABS: 75.0000 mg | ORAL_TABLET | Freq: Every day | ORAL | 1 refills | Status: DC

## 2019-12-14 ENCOUNTER — Other Ambulatory Visit: Payer: Self-pay

## 2019-12-14 DIAGNOSIS — G4733 Obstructive sleep apnea (adult) (pediatric): Secondary | ICD-10-CM | POA: Diagnosis not present

## 2019-12-14 MED ORDER — CLOPIDOGREL BISULFATE 75 MG PO TABS: 75.0000 mg | ORAL_TABLET | Freq: Every day | ORAL | 3 refills | Status: DC

## 2019-12-16 DIAGNOSIS — G4733 Obstructive sleep apnea (adult) (pediatric): Secondary | ICD-10-CM | POA: Diagnosis not present

## 2019-12-20 ENCOUNTER — Encounter: Payer: Self-pay | Admitting: Cardiology

## 2019-12-20 ENCOUNTER — Telehealth (INDEPENDENT_AMBULATORY_CARE_PROVIDER_SITE_OTHER): Payer: Medicare HMO | Admitting: Cardiology

## 2019-12-20 VITALS — BP 145/71 | HR 68 | Ht 60.0 in | Wt 200.0 lb

## 2019-12-20 DIAGNOSIS — I10 Essential (primary) hypertension: Secondary | ICD-10-CM | POA: Diagnosis not present

## 2019-12-20 DIAGNOSIS — G4733 Obstructive sleep apnea (adult) (pediatric): Secondary | ICD-10-CM | POA: Diagnosis not present

## 2019-12-20 DIAGNOSIS — I739 Peripheral vascular disease, unspecified: Secondary | ICD-10-CM

## 2019-12-20 DIAGNOSIS — E7800 Pure hypercholesterolemia, unspecified: Secondary | ICD-10-CM

## 2019-12-20 DIAGNOSIS — Z9989 Dependence on other enabling machines and devices: Secondary | ICD-10-CM | POA: Diagnosis not present

## 2019-12-20 NOTE — Patient Instructions (Signed)
Medication Instructions:  Your physician recommends that you continue on your current medications as directed. Please refer to the Current Medication list given to you today.  *If you need a refill on your cardiac medications before your next appointment, please call your pharmacy*    Follow-Up: At [facility], you and your health needs are our priority.  As part of our continuing mission to provide you with exceptional heart care, we have created designated Provider Care Teams.  These Care Teams include your primary Cardiologist (physician) and Advanced Practice Providers (APPs -  Physician Assistants and Nurse Practitioners) who all work together to provide you with the care you need, when you need it.  We recommend signing up for the patient portal called "MyChart".  Sign up information is provided on this After Visit Summary.  MyChart is used to connect with patients for Virtual Visits (Telemedicine).  Patients are able to view lab/test results, encounter notes, upcoming appointments, etc.  Non-urgent messages can be sent to your provider as well.   To learn more about what you can do with MyChart, go to [email or web address].    Your next appointment:   12 month(s) (After dopplers have been completed)  The format for your next appointment:   In Person  Provider:   You may see Quay Burow, MD or one of the following Advanced Practice Providers on your designated Care Team:    Kerin Ransom, PA-C  [place], [place]  Coletta Memos, [place]    Other Instructions Your physician has requested that you regularly monitor and record your blood pressure readings at home, 2-3 times a week. Please use the same machine at the same time of day to check your readings and record them to bring to your follow-up visit. If your blood pressure is consistently above 135 (top number), please call us our your primary care provider.

## 2019-12-20 NOTE — Progress Notes (Signed)
Virtual Visit via Telephone Note   This visit type was conducted due to national recommendations for restrictions regarding the COVID-19 Pandemic (e.g. social distancing) in an effort to limit this patient's exposure and mitigate transmission in our community.  Due to her co-morbid illnesses, this patient is at least at moderate risk for complications without adequate follow up.  This format is felt to be most appropriate for this patient at this time.  The patient did not have access to video technology/had technical difficulties with video requiring transitioning to audio format only (telephone).  All issues noted in this document were discussed and addressed.  No physical exam could be performed with this format.  Please refer to the patient's chart for her  consent to telehealth for [facility].   The patient was identified using 2 identifiers.  Date:  12/20/2019   ID:  Fuller Plan, DOB August 26, 1945, MRN [id number]  Patient Location: Home Provider Location: Home  PCP:  Rutherford Guys, MD  Cardiologist:  Dr Gwenlyn Found Electrophysiologist:  None   Evaluation Performed:  Follow-Up Visit  Chief Complaint:  none  History of Present Illness:    Alicia Parker is a 74 y.o. female with a history of hypertension, sleep apnea on CPAP, dyslipidemia, prior smoking, and peripheral vascular disease.  Dr. Alvester Chou saw her for left lower extremity claudication.  This led ultimately to a peripheral angiogram and left SFA PTA November 2020.  The patient recently had follow-up Dopplers.  Her left SFA stent is widely patent, follow-up is recommended for 1 year.  She was contacted today by phone.  She does say she has some left leg pain but it is at night when she lays down.  She was seen earlier this year with some back problems, I told her I suspected leg pain at night is usually secondary to a back issue.  She will follow up with her family doctor about this.  She denies any claudication.  The patient  does not have symptoms concerning for COVID-19 infection (fever, chills, cough, or new shortness of breath).    Past Medical History:  Diagnosis Date  . Cataract   . Essential hypertension, benign   . GERD (gastroesophageal reflux disease)   . Hyperlipidemia    no current medications  . PAD (peripheral artery disease) ([place]) 2020  . Sleep apnea    doesnt wear cpap    Past Surgical History:  Procedure Laterality Date  . ABDOMINAL AORTOGRAM W/LOWER EXTREMITY N/A 05/16/2019   Procedure: ABDOMINAL AORTOGRAM W/LOWER EXTREMITY;  Surgeon: Lorretta Harp, MD;  Location: [place] [facility];  Service: Cardiovascular;  Laterality: N/A;  . APPENDECTOMY  1968  . CHOLECYSTECTOMY  1968  . COLONOSCOPY  2004   polyps  . PERIPHERAL VASCULAR INTERVENTION  05/16/2019   Procedure: PERIPHERAL VASCULAR INTERVENTION;  Surgeon: Lorretta Harp, MD;  Location: [place] [facility];  Service: Cardiovascular;;  . REDUCTION MAMMAPLASTY Bilateral   . TOE SURGERY    . TUBAL LIGATION  1968  . UPPER GASTROINTESTINAL ENDOSCOPY       Current Meds  Medication Sig  . amLODipine (NORVASC) 2.5 MG tablet Take 1 tablet (2.5 mg total) by mouth daily.  Marland Kitchen aspirin EC 81 MG tablet Take 81 mg by mouth daily.  Marland Kitchen atorvastatin (LIPITOR) 20 MG tablet Take 1 tablet (20 mg total) by mouth daily.  Marland Kitchen bismuth subsalicylate (PEPTO BISMOL) 262 MG/15ML suspension Take 30 mLs by mouth every 6 (six) hours as needed for indigestion.  Marland Kitchen  clopidogrel (PLAVIX) 75 MG tablet Take 1 tablet (75 mg total) by mouth daily.  . famotidine (PEPCID) 20 MG tablet Take 1 tablet (20 mg total) by mouth 2 (two) times daily.  . Ibuprofen (ADVIL) 200 MG CAPS Take by mouth. OTC PRN  . loratadine (CLARITIN) 10 MG tablet Take 10 mg by mouth at bedtime.  . methocarbamol (ROBAXIN) 500 MG tablet Take 1 tablet (500 mg total) by mouth every 6 (six) hours as needed for muscle spasms.  . naproxen sodium (ALEVE) 220 MG tablet Take 440 mg by mouth daily as needed  (headaches).  . Omega-3 Fatty Acids (OMEGA 3 PO) Take 2 capsules by mouth 3 (three) times daily with meals.  . sertraline (ZOLOFT) 50 MG tablet Take 1.5 tablets (75 mg total) by mouth daily.  . Vitamin D, Ergocalciferol, (DRISDOL) 1.25 MG (50000 UNIT) CAPS capsule TAKE 1 CAPSULE (50,000 UNITS TOTAL) BY MOUTH EVERY 7 (SEVEN) DAYS.     Allergies:   Patient has no known allergies.   Social History   Tobacco Use  . Smoking status: Former Smoker    Packs/day: 1.00    Years: 10.00    Pack years: 10.00    Types: Cigarettes    Quit date: 11/2018    Years since quitting: 1.0  . Smokeless tobacco: Never Used  Vaping Use  . Vaping Use: Never used  Substance Use Topics  . Alcohol use: Yes    Alcohol/week: 6.0 standard drinks    Types: 6 Cans of beer per week    Comment: daily-   . Drug use: No     Family Hx: The patient's family history includes Colon polyps in her sister; Early death in her brother and brother; Hypertension in her father, maternal grandmother, and mother; Leukemia (age of onset: 71) in her father; Stroke in her mother. There is no history of Colon cancer, Esophageal cancer, Rectal cancer, or Stomach cancer.  ROS:   Please see the history of present illness.    All other systems reviewed and are negative.   Prior CV studies:   The following studies were reviewed today:  LEA dopplers 11/30/2019- Summary:  Left: Mild progression is noted compared to previous study.   Heterogenous plaque throughout.  50-74% stenosis in the proximal CFA, low end range.  30-49% stenosis in the distal CFA, high end range.  30-49% stenosis at the ostial SFA.  Patent mid SFA stent without stenosis.   Labs/Other Tests and Data Reviewed:    EKG:  An ECG dated 09/19/2019 was personally reviewed today and demonstrated:  NSR-HR 58  Recent Labs: 01/25/2019: TSH 3.410 09/11/2019: ALT 20; BUN 14; Creatinine, Ser 0.77; Hemoglobin 12.0; Platelets 287; Potassium 4.4; Sodium 138   Recent Lipid  Panel Lab Results  Component Value Date/Time   CHOL 195 07/28/2019 03:46 PM   TRIG 57 07/28/2019 03:46 PM   HDL 80 07/28/2019 03:46 PM   CHOLHDL 2.4 07/28/2019 03:46 PM   CHOLHDL 3.1 10/15/2011 11:30 AM   LDLCALC 104 (H) 07/28/2019 03:46 PM    Wt Readings from Last 3 Encounters:  12/20/19 200 lb (90.7 kg)  09/13/19 187 lb (84.8 kg)  09/11/19 187 lb (84.8 kg)     Objective:    Vital Signs:  BP (!) 145/71   Pulse 68   Ht 5' (1.524 m)   Wt 200 lb (90.7 kg)   LMP 06/16/1997   BMI 39.06 kg/m    VITAL SIGNS:  reviewed  ASSESSMENT & PLAN:  PAD- S/P mLSFA PTA Nov 2020- patent with residual moderate Lt LEA disease by doppler June 2021.  No recent claudication.  HTN- B/P above goal of less than 135/85.  I have asked to monitor this and let us know if this a consistent reading, if so her medications will need adjusting.   Dyslipidemia- LDL goal < 70.  Recent LDL was 104 Feb 2021 on Lipitor 20mg .  This is being followed by her PCP.  Sleep apnea- On C-pap per history  Plan- F/U dopplers in one year, then OV with Dr Gwenlyn Found.  She will monitor her B/P- if consistently above 135/85 she will need adjustment in her medications.  Will defer lipid Rx to her PCP.   COVID-19 Education: The signs and symptoms of COVID-19 were discussed with the patient and how to seek care for testing (follow up with PCP or arrange E-visit).  The importance of social distancing was discussed today.  Time:   Today, I have spent 15 minutes with the patient with telehealth technology discussing the above problems.     Medication Adjustments/Labs and Tests Ordered: Current medicines are reviewed at length with the patient today.  Concerns regarding medicines are outlined above.   Tests Ordered: No orders of the defined types were placed in this encounter.   Medication Changes: No orders of the defined types were placed in this encounter.   Follow Up:  In Person in one year with Dr  Gwenlyn Found  Signed, Kerin Ransom, PA-C  12/20/2019 11:22 AM    [place]

## 2019-12-22 ENCOUNTER — Ambulatory Visit: Payer: Medicare HMO | Admitting: Family Medicine

## 2019-12-22 ENCOUNTER — Other Ambulatory Visit: Payer: Self-pay

## 2019-12-22 ENCOUNTER — Encounter: Payer: Self-pay | Admitting: Family Medicine

## 2019-12-22 VITALS — BP 119/66 | HR 65 | Ht 60.0 in | Wt 193.4 lb

## 2019-12-22 DIAGNOSIS — G4733 Obstructive sleep apnea (adult) (pediatric): Secondary | ICD-10-CM | POA: Diagnosis not present

## 2019-12-22 DIAGNOSIS — Z9989 Dependence on other enabling machines and devices: Secondary | ICD-10-CM | POA: Diagnosis not present

## 2019-12-22 NOTE — Progress Notes (Addendum)
PATIENT: Alicia Parker DOB: 1945-08-19  REASON FOR VISIT: follow up HISTORY FROM: patient  Chief Complaint  Patient presents with  . Follow-up    6 month f/u for OSA, still struggling with machine.   . room 1    alone     HISTORY OF PRESENT ILLNESS: Today 12/22/19 Alicia Parker is a 74 y.o. female here today for follow up for OSA on CPAP. She is doing better with compliance. She continues to struggle with getting comfortable with machine. She has noted a leak in her mask. She is able to correct it when she tightens her headgear. She does feel better when using CPAP. She was unable to go to sleep one night this week after removing her mask. She fell back to sleep quickly once replacing it.   Compliance report dated 11/21/2019 through 12/20/2019 reveals that she used CPAP 30 of the past 30 days for compliance of 100%.  She used CPAP greater than 4 hours all 30 days.  Average usage was 6 hours and 40 minutes.  Visual AHI was 5.3 on 7 to 14 cm of water and an EPR of three.  There was a significant leak noted in the 95th percentile of 40.7.  HISTORY: (copied from my note on 06/23/2019)  MIHAELA Parker is a 74 y.o. female here today for follow up. Alicia Parker returns today for initial compliance review after being diagnosed with severe obstructive sleep apnea. She was started on CPAP about 2 months ago. She reports that she is doing well on CPAP. She does note improvement in energy. There are some mornings where she has not noticed much benefit with using CPAP. She is motivated to continue using CPAP. She is having no difficulties with the machine or supplies.  Compliance report dated 05/23/2019 through 06/21/2019 reveals that she use CPAP every night the last 30 days for compliance of 100%. She CPAP greater than 4 hours every night. Average usage was 6 hours and 17 minutes. Residual AHI was 4 on 7 to 14 cm of water and an EPR of 3. There was no significant leak.  HISTORY: (copied from Dr Guadelupe Sabin  note on 03/03/2019)  Dear Dr. Linna Parker, I saw your patient, Alicia Parker, upon your kind request in my sleep clinic today for initial consultation of her sleep disorder, in particular, reevaluation of her prior diagnosis of obstructive sleep apnea. The patient is unaccompanied today. As you know, Ms. Jobst is a 74 year old right-handed woman with an underlying medical history of reflux disease, anxiety, hypertension, hyperlipidemia, cataracts and obesity, who was previously diagnosed with obstructive sleep apnea and placed on CPAP therapy. Prior sleep study results are not available for my review today. She has not been using her CPAP machine in some years. She has had some weight gain over time. I reviewed your office note from 02/18/2019. Her Epworth sleepiness score is 17 out of 24, fatigue severity score is 54 out of 63. She is married and lives with her husband and daughter. She has 3 children. She quit smoking in June 2020. She drinks alcohol in the form of beer, about 2/day but is cutting back, and caffeine in the form of coffee, about 1 or 1-1/2 cups/day on average. She does not typically drink soda on a day-to-day basis or tea. She has no pets, she has a TV in the bedroom and falls asleep with the TV on. She is generally in bed before 9 and rise time varies, between 7 and 10.  She is retired from Scientist, research (medical) work. She reports that her husband has PTSD and is a very restless sleeper and she is planning to separate their bedrooms as none of them are sleeping well. She has a family history of sleep apnea and her brother who has a CPAP machine and her son. She has 2 sons and 1 daughter. She estimates that she has not been on CPAP therapy since 2004, she reports that her machine was from 2000. She had difficulty with the full facemask. She would be willing to get retested and consider CPAP therapy again. She denies recurrent morning headaches but has nocturia about 2 or 3 times per average night  and also allergy symptoms.    REVIEW OF SYSTEMS: Out of a complete 14 system review of symptoms, none the patient complains only of the following symptoms, none and all other reviewed systems are negative.  ESS: 7 FSS:37  ALLERGIES: No Known Allergies  HOME MEDICATIONS: Outpatient Medications Prior to Visit  Medication Sig Dispense Refill  . amLODipine (NORVASC) 2.5 MG tablet Take 1 tablet (2.5 mg total) by mouth daily. 90 tablet 1  . aspirin EC 81 MG tablet Take 81 mg by mouth daily.    Marland Kitchen atorvastatin (LIPITOR) 20 MG tablet Take 1 tablet (20 mg total) by mouth daily. 90 tablet 1  . bismuth subsalicylate (PEPTO BISMOL) 262 MG/15ML suspension Take 30 mLs by mouth every 6 (six) hours as needed for indigestion.    . clopidogrel (PLAVIX) 75 MG tablet Take 1 tablet (75 mg total) by mouth daily. 90 tablet 3  . famotidine (PEPCID) 20 MG tablet Take 1 tablet (20 mg total) by mouth 2 (two) times daily. 180 tablet 0  . Ibuprofen (ADVIL) 200 MG CAPS Take by mouth. OTC PRN    . loratadine (CLARITIN) 10 MG tablet Take 10 mg by mouth at bedtime.    . methocarbamol (ROBAXIN) 500 MG tablet Take 1 tablet (500 mg total) by mouth every 6 (six) hours as needed for muscle spasms. 30 tablet 3  . naproxen sodium (ALEVE) 220 MG tablet Take 440 mg by mouth daily as needed (headaches).    . Omega-3 Fatty Acids (OMEGA 3 PO) Take 2 capsules by mouth 3 (three) times daily with meals.    . sertraline (ZOLOFT) 50 MG tablet Take 1.5 tablets (75 mg total) by mouth daily. 180 tablet 1  . Vitamin D, Ergocalciferol, (DRISDOL) 1.25 MG (50000 UNIT) CAPS capsule TAKE 1 CAPSULE (50,000 UNITS TOTAL) BY MOUTH EVERY 7 (SEVEN) DAYS. 12 capsule 0   No facility-administered medications prior to visit.    PAST MEDICAL HISTORY: Past Medical History:  Diagnosis Date  . Cataract   . Essential hypertension, benign   . GERD (gastroesophageal reflux disease)   . Hyperlipidemia    no current medications  . PAD (peripheral artery  disease) ([place]) 2020  . Sleep apnea    doesnt wear cpap     PAST SURGICAL HISTORY: Past Surgical History:  Procedure Laterality Date  . ABDOMINAL AORTOGRAM W/LOWER EXTREMITY N/A 05/16/2019   Procedure: ABDOMINAL AORTOGRAM W/LOWER EXTREMITY;  Surgeon: Lorretta Harp, MD;  Location: [facility];  Service: Cardiovascular;  Laterality: N/A;  . APPENDECTOMY  1968  . CHOLECYSTECTOMY  1968  . COLONOSCOPY  2004   polyps  . PERIPHERAL VASCULAR INTERVENTION  05/16/2019   Procedure: PERIPHERAL VASCULAR INTERVENTION;  Surgeon: Lorretta Harp, MD;  Location: [facility];  Service: Cardiovascular;;  . REDUCTION MAMMAPLASTY Bilateral   .  TOE SURGERY    . TUBAL LIGATION  1968  . UPPER GASTROINTESTINAL ENDOSCOPY      FAMILY HISTORY: Family History  Problem Relation Age of Onset  . Hypertension Mother   . Stroke Mother   . Hypertension Father   . Leukemia Father 14  . Early death Brother        pneumonia  . Hypertension Maternal Grandmother   . Early death Brother   . Colon polyps Sister   . Colon cancer Neg Hx   . Esophageal cancer Neg Hx   . Rectal cancer Neg Hx   . Stomach cancer Neg Hx     SOCIAL HISTORY: Social History   Socioeconomic History  . Marital status: Married    Spouse name: Not on file  . Number of children: Not on file  . Years of education: Not on file  . Highest education level: Not on file  Occupational History  . Not on file  Tobacco Use  . Smoking status: Former Smoker    Packs/day: 1.00    Years: 10.00    Pack years: 10.00    Types: Cigarettes    Quit date: 11/2018    Years since quitting: 1.1  . Smokeless tobacco: Never Used  Vaping Use  . Vaping Use: Never used  Substance and Sexual Activity  . Alcohol use: Yes    Alcohol/week: 6.0 standard drinks    Types: 6 Cans of beer per week    Comment: daily-   . Drug use: No  . Sexual activity: Yes    Partners: Male    Birth control/protection: Surgical    Comment: btl  Other  Topics Concern  . Not on file  Social History Narrative  . Not on file   Social Determinants of Health   Financial Resource Strain:   . Difficulty of Paying Living Expenses:   Food Insecurity:   . Worried About Charity fundraiser in the Last Year:   . Arboriculturist in the Last Year:   Transportation Needs:   . Film/video editor (Medical):   Marland Kitchen Lack of Transportation (Non-Medical):   Physical Activity:   . Days of Exercise per Week:   . Minutes of Exercise per Session:   Stress:   . Feeling of Stress :   Social Connections:   . Frequency of Communication with Friends and Family:   . Frequency of Social Gatherings with Friends and Family:   . Attends Religious Services:   . Active Member of Clubs or Organizations:   . Attends Archivist Meetings:   Marland Kitchen Marital Status:   Intimate Partner Violence:   . Fear of Current or Ex-Partner:   . Emotionally Abused:   Marland Kitchen Physically Abused:   . Sexually Abused:       PHYSICAL EXAM  Vitals:   12/22/19 1247  BP: 119/66  Pulse: 65  Weight: 193 lb 6.4 oz (87.7 kg)  Height: 5' (1.524 m)   Body mass index is 37.77 kg/m.  Generalized: Well developed, in no acute distress  Cardiology: normal rate and rhythm, no murmur noted Respiratory: clear to auscultation bilaterally  Neurological examination  Mentation: Alert oriented to time, place, history taking. Follows all commands speech and language fluent Cranial nerve II-XII: Pupils were equal round reactive to light. Extraocular movements were full, visual field were full  Motor: The motor testing reveals 5 over 5 strength of all 4 extremities. Good symmetric motor tone is noted throughout.  Gait  and station: Gait is normal.   DIAGNOSTIC DATA (LABS, IMAGING, TESTING) - I reviewed patient records, labs, notes, testing and imaging myself where available.  No flowsheet data found.   Lab Results  Component Value Date   WBC 5.2 09/11/2019   HGB 12.0 09/11/2019   HCT  39.4 09/11/2019   MCV 85.1 09/11/2019   PLT 287 09/11/2019      Component Value Date/Time   NA 138 09/11/2019 1533   NA 137 07/28/2019 1546   K 4.4 09/11/2019 1533   CL 103 09/11/2019 1533   CO2 23 09/11/2019 1533   GLUCOSE 91 09/11/2019 1533   BUN 14 09/11/2019 1533   BUN 10 07/28/2019 1546   CREATININE 0.77 09/11/2019 1533   CREATININE 1.09 (H) 04/25/2015 1706   CALCIUM 9.6 09/11/2019 1533   PROT 7.1 09/11/2019 1533   PROT 6.5 07/28/2019 1546   ALBUMIN 3.8 09/11/2019 1533   ALBUMIN 4.1 07/28/2019 1546   AST 18 09/11/2019 1533   ALT 20 09/11/2019 1533   ALKPHOS 92 09/11/2019 1533   BILITOT 0.6 09/11/2019 1533   BILITOT 0.2 07/28/2019 1546   GFRNONAA >60 09/11/2019 1533   GFRNONAA 52 (L) 04/25/2015 1706   GFRAA >60 09/11/2019 1533   GFRAA 60 04/25/2015 1706   Lab Results  Component Value Date   CHOL 195 07/28/2019   HDL 80 07/28/2019   LDLCALC 104 (H) 07/28/2019   TRIG 57 07/28/2019   CHOLHDL 2.4 07/28/2019   Lab Results  Component Value Date   HGBA1C 5.5 11/24/2016   No results found for: VITAMINB12 Lab Results  Component Value Date   TSH 3.410 01/25/2019       ASSESSMENT AND PLAN 74 y.o. year old female  has a past medical history of Cataract, Essential hypertension, benign, GERD (gastroesophageal reflux disease), Hyperlipidemia, PAD (peripheral artery disease) ([place]) (2020), and Sleep apnea. here with     ICD-10-CM   1. OSA on CPAP  G47.33 For home use only DME continuous positive airway pressure (CPAP)   Z99.89     Stela is doing better with CPAP compliance. She continues to battle with her mask and tubing. She does not love using CPAP but knows that it is beneficial. She does wish to continue use. She was commended on her excellent compliance over the past 30 days. I have encouraged her to continue nightly use and use greater than 4 hours each night. Healthy lifestyle habits encouraged. She will follow up with me in 6 months, sooner if needed. She  verbalizes understanding and agreement with this plan.    Orders Placed This Encounter  Procedures  . For home use only DME continuous positive airway pressure (CPAP)    Supplies    Order Specific Question:   Length of Need    Answer:   Lifetime    Order Specific Question:   Patient has OSA or probable OSA    Answer:   Yes    Order Specific Question:   Is the patient currently using CPAP in the home    Answer:   Yes    Order Specific Question:   Settings    Answer:   Other see comments    Order Specific Question:   CPAP supplies needed    Answer:   Mask, headgear, cushions, filters, heated tubing and water chamber     No orders of the defined types were placed in this encounter.     I spent 15 minutes with the patient. 50%  of this time was spent counseling and educating patient on plan of care and medications.    Debbora Presto, FNP-C 12/22/2019, 1:23 PM [facility] [address], [place], [place] [address] [phone]  I reviewed the above note and documentation by the Nurse Practitioner and agree with the history, exam, assessment and plan as outlined above. I was available for consultation. Star Age, MD, PhD [facility])

## 2019-12-22 NOTE — Patient Instructions (Signed)
Please continue using your CPAP regularly. While your insurance requires that you use CPAP at least 4 hours each night on 70% of the nights, I recommend, that you not skip any nights and use it throughout the night if you can. Getting used to CPAP and staying with the treatment long term does take time and patience and discipline. Untreated obstructive sleep apnea when it is moderate to severe can have an adverse impact on cardiovascular health and raise her risk for heart disease, arrhythmias, hypertension, congestive heart failure, stroke and diabetes. Untreated obstructive sleep apnea causes sleep disruption, nonrestorative sleep, and sleep deprivation. This can have an impact on your day to day functioning and cause daytime sleepiness and impairment of cognitive function, memory loss, mood disturbance, and problems focussing. Using CPAP regularly can improve these symptoms.   Follow up in 6 months   CPAP and BPAP Information CPAP and BPAP are methods of helping a person breathe with the use of air pressure. CPAP stands for "continuous positive airway pressure." BPAP stands for "bi-level positive airway pressure." In both methods, air is blown through your nose or mouth and into your air passages to help you breathe well. CPAP and BPAP use different amounts of pressure to blow air. With CPAP, the amount of pressure stays the same while you breathe in and out. With BPAP, the amount of pressure is increased when you breathe in (inhale) so that you can take larger breaths. Your health care provider will recommend whether CPAP or BPAP would be more helpful for you. Why are CPAP and BPAP treatments used? CPAP or BPAP can be helpful if you have:  Sleep apnea.  Chronic obstructive pulmonary disease (COPD).  Heart failure.  Medical conditions that weaken the muscles of the chest including muscular dystrophy, or neurological diseases such as amyotrophic lateral sclerosis (ALS).  Other problems that  cause breathing to be weak, abnormal, or difficult. CPAP is most commonly used for obstructive sleep apnea (OSA) to keep the airways from collapsing when the muscles relax during sleep. How is CPAP or BPAP administered? Both CPAP and BPAP are provided by a small machine with a flexible plastic tube that attaches to a plastic mask. You wear the mask. Air is blown through the mask into your nose or mouth. The amount of pressure that is used to blow the air can be adjusted on the machine. Your health care provider will determine the pressure setting that should be used based on your individual needs. When should CPAP or BPAP be used? In most cases, the mask only needs to be worn during sleep. Generally, the mask needs to be worn throughout the night and during any daytime naps. People with certain medical conditions may also need to wear the mask at other times when they are awake. Follow instructions from your health care provider about when to use the machine. What are some tips for using the mask?   Because the mask needs to be snug, some people feel trapped or closed-in (claustrophobic) when first using the mask. If you feel this way, you may need to get used to the mask. One way to do this is by holding the mask loosely over your nose or mouth and then gradually applying the mask more snugly. You can also gradually increase the amount of time that you use the mask.  Masks are available in various types and sizes. Some fit over your mouth and nose while others fit over just your nose. If your mask  does not fit well, talk with your health care provider about getting a different one. °· If you are using a mask that fits over your nose and you tend to breathe through your mouth, a chin strap may be applied to help keep your mouth closed. °· The CPAP and BPAP machines have alarms that may sound if the mask comes off or develops a leak. °· If you have trouble with the mask, it is very important that you talk  with your health care provider about finding a way to make the mask easier to tolerate. Do not stop using the mask. Stopping the use of the mask could have a negative impact on your health. °What are some tips for using the machine? °· Place your CPAP or BPAP machine on a secure table or stand near an electrical outlet. °· Know where the on/off switch is located on the machine. °· Follow instructions from your health care provider about how to set the pressure on your machine and when you should use it. °· Do not eat or drink while the CPAP or BPAP machine is on. Food or fluids could get pushed into your lungs by the pressure of the CPAP or BPAP. °· Do not smoke. Tobacco smoke residue can damage the machine. °· For home use, CPAP and BPAP machines can be rented or purchased through home health care companies. Many different brands of machines are available. Renting a machine before purchasing may help you find out which particular machine works well for you. °· Keep the CPAP or BPAP machine and attachments clean. Ask your health care provider for specific instructions. °Get help right away if: °· You have redness or open areas around your nose or mouth where the mask fits. °· You have trouble using the CPAP or BPAP machine. °· You cannot tolerate wearing the CPAP or BPAP mask. °· You have pain, discomfort, and bloating in your abdomen. °Summary °· CPAP and BPAP are methods of helping a person breathe with the use of air pressure. °· Both CPAP and BPAP are provided by a small machine with a flexible plastic tube that attaches to a plastic mask. °· If you have trouble with the mask, it is very important that you talk with your health care provider about finding a way to make the mask easier to tolerate. °This information is not intended to replace advice given to you by your health care provider. Make sure you discuss any questions you have with your health care provider. °Document Revised: 09/22/2018 Document  Reviewed: 04/21/2016 °Elsevier Patient Education © 2020 [employer]vier Inc. ° °

## 2020-01-11 DIAGNOSIS — G4733 Obstructive sleep apnea (adult) (pediatric): Secondary | ICD-10-CM | POA: Diagnosis not present

## 2020-01-16 DIAGNOSIS — G4733 Obstructive sleep apnea (adult) (pediatric): Secondary | ICD-10-CM | POA: Diagnosis not present

## 2020-01-17 ENCOUNTER — Other Ambulatory Visit: Payer: Self-pay | Admitting: Family Medicine

## 2020-01-17 NOTE — Telephone Encounter (Signed)
Requested medication (s) are due for refill today: yes  Requested medication (s) are on the active medication list: yes  Last refill: 10/28/19  #12  0 refills  Future visit scheduled yes 01/26/20  Notes to clinic: not delegated  Requested Prescriptions  Pending Prescriptions Disp Refills   Vitamin D, Ergocalciferol, (DRISDOL) 1.25 MG (50000 UNIT) CAPS capsule [Pharmacy Med Name: VITAMIN D2 1.25MG (50,000 UNIT)] 12 capsule 0    Sig: TAKE 1 CAPSULE (50,000 UNITS TOTAL) BY MOUTH EVERY 7 (SEVEN) DAYS.      Endocrinology:  Vitamins - Vitamin D Supplementation Failed - 01/17/2020  1:43 AM      Failed - 50,000 IU strengths are not delegated      Failed - Phosphate in normal range and within 360 days    No results found for: PHOS        Failed - Vitamin D in normal range and within 360 days    Vit D, 25-Hydroxy  Date Value Ref Range Status  07/28/2019 16.8 (L) 30.0 - 100.0 ng/mL Final    Comment:    Vitamin D deficiency has been defined by the Institute of Medicine and an Endocrine Society practice guideline as a level of serum 25-OH vitamin D less than 20 ng/mL (1,2). The Endocrine Society went on to further define vitamin D insufficiency as a level between 21 and 29 ng/mL (2). 1. IOM (Institute of Medicine). 2010. Dietary reference    intakes for calcium and D. [place]: The    [employer]. 2. Holick MF, Binkley Garwin, Bischoff-Ferrari HA, et al.    Evaluation, treatment, and prevention of vitamin D    deficiency: an Endocrine Society clinical practice    guideline. JCEM. 2011 Jul; 96(7):1911-30.           Passed - Ca in normal range and within 360 days    Calcium  Date Value Ref Range Status  09/11/2019 9.6 8.9 - 10.3 mg/dL Final          Passed - Valid encounter within last 12 months    Recent Outpatient Visits           4 months ago Mid back pain   [facility], Lilia Argue, MD   4 months ago Mid back pain   [facility], Lilia Argue, MD   5 months ago Essential hypertension   [facility], Lilia Argue, MD   8 months ago Generalized anxiety disorder   [facility], Lilia Argue, MD   9 months ago Generalized anxiety disorder   [facility], Lilia Argue, MD       Future Appointments             In 1 week  [facility] at [place], [facility]   In 2 weeks Rutherford Guys, MD [facility] at [place], [facility]

## 2020-01-23 ENCOUNTER — Telehealth: Payer: Self-pay

## 2020-01-23 NOTE — Telephone Encounter (Signed)
Pt is needing understanding on whether or not she is to continue vit d.

## 2020-01-23 NOTE — Telephone Encounter (Signed)
Pt. Called informing the practice she had been provided with an additional refill of Vitamin D. Pt. Was under the impression she was to stop taking that vitamin as of this week

## 2020-01-24 NOTE — Telephone Encounter (Signed)
Pls call pt to schedule nurse visit for vit d. Lab are already pending.

## 2020-01-24 NOTE — Telephone Encounter (Signed)
She is to come in for labs to see if high dose is still needed. She had labs ordered in feb that should be released. thanks

## 2020-01-25 ENCOUNTER — Ambulatory Visit (INDEPENDENT_AMBULATORY_CARE_PROVIDER_SITE_OTHER): Payer: Medicare HMO | Admitting: Registered Nurse

## 2020-01-25 VITALS — BP 119/66 | Ht 60.0 in | Wt 193.0 lb

## 2020-01-25 DIAGNOSIS — Z0000 Encounter for general adult medical examination without abnormal findings: Secondary | ICD-10-CM

## 2020-01-25 NOTE — Patient Instructions (Addendum)
Thank you for taking time to come for your Medicare Wellness Visit. I appreciate your ongoing commitment to your health goals. Please review the following plan we discussed and let me know if I can assist you in the future.  Billi Bright LPN  Preventive Care 73 Years and Older, Female Preventive care refers to lifestyle choices and visits with your health care provider that can promote health and wellness. This includes:  A yearly physical exam. This is also called an annual well check.  Regular dental and eye exams.  Immunizations.  Screening for certain conditions.  Healthy lifestyle choices, such as diet and exercise. What can I expect for my preventive care visit? Physical exam Your health care provider will check:  Height and weight. These may be used to calculate body mass index (BMI), which is a measurement that tells if you are at a healthy weight.  Heart rate and blood pressure.  Your skin for abnormal spots. Counseling Your health care provider may ask you questions about:  Alcohol, tobacco, and drug use.  Emotional well-being.  Home and relationship well-being.  Sexual activity.  Eating habits.  History of falls.  Memory and ability to understand (cognition).  Work and work environment.  Pregnancy and menstrual history. What immunizations do I need?  Influenza (flu) vaccine  This is recommended every year. Tetanus, diphtheria, and pertussis (Tdap) vaccine  You may need a Td booster every 10 years. Varicella (chickenpox) vaccine  You may need this vaccine if you have not already been vaccinated. Zoster (shingles) vaccine  You may need this after age 60. Pneumococcal conjugate (PCV13) vaccine  One dose is recommended after age 65. Pneumococcal polysaccharide (PPSV23) vaccine  One dose is recommended after age 65. Measles, mumps, and rubella (MMR) vaccine  You may need at least one dose of MMR if you were born in 1957 or later. You may also  need a second dose. Meningococcal conjugate (MenACWY) vaccine  You may need this if you have certain conditions. Hepatitis A vaccine  You may need this if you have certain conditions or if you travel or work in places where you may be exposed to hepatitis A. Hepatitis B vaccine  You may need this if you have certain conditions or if you travel or work in places where you may be exposed to hepatitis B. Haemophilus influenzae type b (Hib) vaccine  You may need this if you have certain conditions. You may receive vaccines as individual doses or as more than one vaccine together in one shot (combination vaccines). Talk with your health care provider about the risks and benefits of combination vaccines. What tests do I need? Blood tests  Lipid and cholesterol levels. These may be checked every 5 years, or more frequently depending on your overall health.  Hepatitis C test.  Hepatitis B test. Screening  Lung cancer screening. You may have this screening every year starting at age 55 if you have a 30-pack-year history of smoking and currently smoke or have quit within the past 15 years.  Colorectal cancer screening. All adults should have this screening starting at age 50 and continuing until age 75. Your health care provider may recommend screening at age 45 if you are at increased risk. You will have tests every 1-10 years, depending on your results and the type of screening test.  Diabetes screening. This is done by checking your blood sugar (glucose) after you have not eaten for a while (fasting). You may have this done every 1-3   years.  Mammogram. This may be done every 1-2 years. Talk with your health care provider about how often you should have regular mammograms.  BRCA-related cancer screening. This may be done if you have a family history of breast, ovarian, tubal, or peritoneal cancers. Other tests  Sexually transmitted disease (STD) testing.  Bone density scan. This is done  to screen for osteoporosis. You may have this done starting at age 41. Follow these instructions at home: Eating and drinking  Eat a diet that includes fresh fruits and vegetables, whole grains, lean protein, and low-fat dairy products. Limit your intake of foods with high amounts of sugar, saturated fats, and salt.  Take vitamin and mineral supplements as recommended by your health care provider.  Do not drink alcohol if your health care provider tells you not to drink.  If you drink alcohol: ? Limit how much you have to 0-1 drink a day. ? Be aware of how much alcohol is in your drink. In the [place] one drink equals one 12 oz bottle of beer (355 mL), one 5 oz glass of wine (148 mL), or one 1 oz glass of hard liquor (44 mL). Lifestyle  Take daily care of your teeth and gums.  Stay active. Exercise for at least 30 minutes on 5 or more days each week.  Do not use any products that contain nicotine or tobacco, such as cigarettes, e-cigarettes, and chewing tobacco. If you need help quitting, ask your health care provider.  If you are sexually active, practice safe sex. Use a condom or other form of protection in order to prevent STIs (sexually transmitted infections).  Talk with your health care provider about taking a low-dose aspirin or statin. What's next?  Go to your health care provider once a year for a well check visit.  Ask your health care provider how often you should have your eyes and teeth checked.  Stay up to date on all vaccines. This information is not intended to replace advice given to you by your health care provider. Make sure you discuss any questions you have with your health care provider. Document Revised: 05/27/2018 Document Reviewed: 05/27/2018 Elsevier Patient Education  2020 [employer].

## 2020-01-25 NOTE — Telephone Encounter (Signed)
Called pt and sch appt for 01/27/20

## 2020-01-25 NOTE — Progress Notes (Addendum)
Presents today for [employer] Visit   Date of last exam:  09/13/2019  Interpreter used for this visit?  No  I connected with  Alicia Parker on 01/25/20 by a telephone and verified that I am speaking with the correct person using two identifiers.   I discussed the limitations of evaluation and management by telemedicine. The patient expressed understanding and agreed to proceed.  Patient location: home  Provider location: in office  I provided 20 minutes of non face - to - face time during this encounter.  Patient Care Team: Rutherford Guys, MD as PCP - General (Family Medicine) Lorretta Harp, MD as PCP - Cardiology (Cardiology)   Other items to address today  Discussed Eye/Dental Discussed Immunizations Follow up Dr. Pamella Pert 8-19 @ 11:00 am Labs to be drawn on 8-13.   Other Screening: Last screening for diabetes: 09-11-2019 Last lipid screening: 07-28-2019    Immunization status:  Immunization History  Administered Date(s) Administered  . Fluad Quad(high Dose 65+) 02/18/2019  . Influenza Split 06/19/2010  . Influenza, High Dose Seasonal PF 05/11/2018  . Influenza,inj,Quad PF,6+ Mos 02/13/2017  . PFIZER SARS-COV-2 Vaccination 07/21/2019, 08/11/2019  . Pneumococcal Conjugate-13 11/24/2016  . Pneumococcal Polysaccharide-23 12/04/2017  . Tdap 10/26/2009  . Zoster Recombinat (Shingrix) 03/03/2019, 05/05/2019     Health Maintenance Due  Topic Date Due  . TETANUS/TDAP  10/27/2019  . INFLUENZA VACCINE  01/15/2020     Functional Status Survey: Is the patient deaf or have difficulty hearing?: No Does the patient have difficulty seeing, even when wearing glasses/contacts?: No Does the patient have difficulty concentrating, remembering, or making decisions?: No Does the patient have difficulty walking or climbing stairs?: No Does the patient have difficulty dressing or bathing?: No Does the patient have difficulty doing errands alone  such as visiting a doctor's office or shopping?: No   6CIT Screen 01/25/2020 01/25/2019  What Year? 0 points 0 points  What month? 0 points 0 points  What time? 0 points 0 points  Count back from 20 0 points 0 points  Months in reverse 0 points 0 points  Repeat phrase 2 points 0 points  Total Score 2 0        Clinical Support from 01/25/2020 in [place] at [facility]  AUDIT-C Score 9       Home Environment:    Lives in a one story home with husband No scattered rugs No grab bars Adequate lighting/ no clutter No trouble climbing stairs   Patient Active Problem List   Diagnosis Date Noted  . OSA on CPAP 05/09/2019  . Generalized anxiety disorder 05/09/2019  . Peripheral arterial disease ([place]) 04/26/2019  . Essential hypertension 04/26/2019  . Elevated LDL cholesterol level 10/18/2011  . Lumbar spondylosis 10/18/2011  . Obesity (BMI 30-39.9) 10/18/2011  . Hx of gastroesophageal reflux (GERD) 10/18/2011  . Bowens disease 10/18/2011  . History of colon polyps 10/18/2011     Past Medical History:  Diagnosis Date  . Cataract   . Essential hypertension, benign   . GERD (gastroesophageal reflux disease)   . Hyperlipidemia    no current medications  . PAD (peripheral artery disease) ([place]) 2020  . Sleep apnea    doesnt wear cpap      Past Surgical History:  Procedure Laterality Date  . ABDOMINAL AORTOGRAM W/LOWER EXTREMITY N/A 05/16/2019   Procedure: ABDOMINAL AORTOGRAM W/LOWER EXTREMITY;  Surgeon: Lorretta Harp, MD;  Location: [facility];  Service: Cardiovascular;  Laterality: N/A;  . APPENDECTOMY  1968  . CHOLECYSTECTOMY  1968  . COLONOSCOPY  2004   polyps  . PERIPHERAL VASCULAR INTERVENTION  05/16/2019   Procedure: PERIPHERAL VASCULAR INTERVENTION;  Surgeon: Lorretta Harp, MD;  Location: [facility];  Service: Cardiovascular;;  . REDUCTION MAMMAPLASTY Bilateral   . TOE SURGERY    . TUBAL LIGATION  1968  . UPPER GASTROINTESTINAL  ENDOSCOPY       Family History  Problem Relation Age of Onset  . Hypertension Mother   . Stroke Mother   . Hypertension Father   . Leukemia Father 46  . Early death Brother        pneumonia  . Hypertension Maternal Grandmother   . Early death Brother   . Colon polyps Sister   . Colon cancer Neg Hx   . Esophageal cancer Neg Hx   . Rectal cancer Neg Hx   . Stomach cancer Neg Hx      Social History   Socioeconomic History  . Marital status: Married    Spouse name: Not on file  . Number of children: Not on file  . Years of education: Not on file  . Highest education level: Not on file  Occupational History  . Not on file  Tobacco Use  . Smoking status: Former Smoker    Packs/day: 1.00    Years: 10.00    Pack years: 10.00    Types: Cigarettes    Quit date: 11/2018    Years since quitting: 1.1  . Smokeless tobacco: Never Used  Vaping Use  . Vaping Use: Never used  Substance and Sexual Activity  . Alcohol use: Yes    Alcohol/week: 6.0 standard drinks    Types: 6 Cans of beer per week    Comment: daily-   . Drug use: No  . Sexual activity: Yes    Partners: Male    Birth control/protection: Surgical    Comment: btl  Other Topics Concern  . Not on file  Social History Narrative  . Not on file   Social Determinants of Health   Financial Resource Strain:   . Difficulty of Paying Living Expenses:   Food Insecurity:   . Worried About Charity fundraiser in the Last Year:   . Arboriculturist in the Last Year:   Transportation Needs:   . Film/video editor (Medical):   Marland Kitchen Lack of Transportation (Non-Medical):   Physical Activity:   . Days of Exercise per Week:   . Minutes of Exercise per Session:   Stress:   . Feeling of Stress :   Social Connections:   . Frequency of Communication with Friends and Family:   . Frequency of Social Gatherings with Friends and Family:   . Attends Religious Services:   . Active Member of Clubs or Organizations:   . Attends  Archivist Meetings:   Marland Kitchen Marital Status:   Intimate Partner Violence:   . Fear of Current or Ex-Partner:   . Emotionally Abused:   Marland Kitchen Physically Abused:   . Sexually Abused:      No Known Allergies   Prior to Admission medications   Medication Sig Start Date End Date Taking? Authorizing Provider  amLODipine (NORVASC) 2.5 MG tablet Take 1 tablet (2.5 mg total) by mouth daily. 12/07/19  Yes Rutherford Guys, MD  aspirin EC 81 MG tablet Take 81 mg by mouth daily.   Yes [provider]  atorvastatin (LIPITOR)  20 MG tablet Take 1 tablet (20 mg total) by mouth daily. 12/07/19  Yes Rutherford Guys, MD  bismuth subsalicylate (PEPTO BISMOL) 262 MG/15ML suspension Take 30 mLs by mouth every 6 (six) hours as needed for indigestion.   Yes [provider]  clopidogrel (PLAVIX) 75 MG tablet Take 1 tablet (75 mg total) by mouth daily. 12/14/19  Yes Lorretta Harp, MD  famotidine (PEPCID) 20 MG tablet Take 1 tablet (20 mg total) by mouth 2 (two) times daily. 12/07/19  Yes Rutherford Guys, MD  Ibuprofen (ADVIL) 200 MG CAPS Take by mouth. OTC PRN   Yes [provider]  loratadine (CLARITIN) 10 MG tablet Take 10 mg by mouth at bedtime.   Yes [provider]  naproxen sodium (ALEVE) 220 MG tablet Take 440 mg by mouth daily as needed (headaches).   Yes [provider]  Omega-3 Fatty Acids (OMEGA 3 PO) Take 2 capsules by mouth 3 (three) times daily with meals.   Yes [provider]  sertraline (ZOLOFT) 50 MG tablet Take 1.5 tablets (75 mg total) by mouth daily. 12/09/19  Yes Rutherford Guys, MD  Vitamin D, Ergocalciferol, (DRISDOL) 1.25 MG (50000 UNIT) CAPS capsule TAKE 1 CAPSULE (50,000 UNITS TOTAL) BY MOUTH EVERY 7 (SEVEN) DAYS. 01/17/20  Yes Rutherford Guys, MD  methocarbamol (ROBAXIN) 500 MG tablet Take 1 tablet (500 mg total) by mouth every 6 (six) hours as needed for muscle spasms. Patient not taking: Reported on 01/25/2020 09/13/19   Rutherford Guys, MD     Depression screen [facility] 2/9 01/25/2020 09/13/2019 07/28/2019 04/01/2019 02/18/2019  Decreased Interest 0 0 0 0 2  Down, Depressed, Hopeless 0 0 0 0 1  PHQ - 2 Score 0 0 0 0 3  Altered sleeping - - - - 2  Tired, decreased energy - - - - 3  Change in appetite - - - - 1  Feeling bad or failure about yourself  - - - - 0  Trouble concentrating - - - - 0  Moving slowly or fidgety/restless - - - - 1  Suicidal thoughts - - - - 0  PHQ-9 Score - - - - 10     Fall Risk  01/25/2020 09/13/2019 07/28/2019 05/09/2019 04/01/2019  Falls in the past year? 0 0 0 0 0  Comment - - - - -  Number falls in past yr: 0 0 0 0 0  Injury with Fall? 0 0 0 0 0  Follow up Falls evaluation completed;Education provided Falls evaluation completed - - -      PHYSICAL EXAM: BP 119/66 Comment: patient not in clinic/ taken from a previous visit  Ht 5' (1.524 m)   Wt 193 lb (87.5 kg)   LMP 06/16/1997   BMI 37.69 kg/m    Wt Readings from Last 3 Encounters:  01/25/20 193 lb (87.5 kg)  12/22/19 193 lb 6.4 oz (87.7 kg)  12/20/19 200 lb (90.7 kg)      Education/Counseling provided regarding diet and exercise, prevention of chronic diseases, smoking/tobacco cessation, if applicable, and reviewed "Covered Medicare Preventive Services."    Agree with above assessment by LPN  Kathrin Ruddy, NP

## 2020-01-27 ENCOUNTER — Other Ambulatory Visit: Payer: Self-pay

## 2020-01-27 ENCOUNTER — Ambulatory Visit: Payer: Medicare HMO

## 2020-01-27 DIAGNOSIS — E785 Hyperlipidemia, unspecified: Secondary | ICD-10-CM

## 2020-01-27 DIAGNOSIS — E559 Vitamin D deficiency, unspecified: Secondary | ICD-10-CM

## 2020-01-28 LAB — COMPREHENSIVE METABOLIC PANEL WITH GFR
ALT: 14 [IU]/L (ref 0–32)
AST: 18 [IU]/L (ref 0–40)
Albumin/Globulin Ratio: 1.7 (ref 1.2–2.2)
Albumin: 4 g/dL (ref 3.7–4.7)
Alkaline Phosphatase: 134 [IU]/L — ABNORMAL HIGH (ref 48–121)
BUN/Creatinine Ratio: 13 (ref 12–28)
BUN: 11 mg/dL (ref 8–27)
Bilirubin Total: 0.4 mg/dL (ref 0.0–1.2)
CO2: 23 mmol/L (ref 20–29)
Calcium: 9.4 mg/dL (ref 8.7–10.3)
Chloride: 103 mmol/L (ref 96–106)
Creatinine, Ser: 0.85 mg/dL (ref 0.57–1.00)
GFR calc Af Amer: 79 mL/min/{1.73_m2}
GFR calc non Af Amer: 69 mL/min/{1.73_m2}
Globulin, Total: 2.3 g/dL (ref 1.5–4.5)
Glucose: 96 mg/dL (ref 65–99)
Potassium: 5 mmol/L (ref 3.5–5.2)
Sodium: 140 mmol/L (ref 134–144)
Total Protein: 6.3 g/dL (ref 6.0–8.5)

## 2020-01-28 LAB — LIPID PANEL
Chol/HDL Ratio: 2.1 ratio (ref 0.0–4.4)
Cholesterol, Total: 152 mg/dL (ref 100–199)
HDL: 72 mg/dL (ref >39)
LDL Chol Calc (NIH): 67 mg/dL (ref 0–99)
Triglycerides: 64 mg/dL (ref 0–149)
VLDL Cholesterol Cal: 13 mg/dL (ref 5–40)

## 2020-01-28 LAB — VITAMIN D 25 HYDROXY (VIT D DEFICIENCY, FRACTURES): Vit D, 25-Hydroxy: 41.1 ng/mL (ref 30.0–100.0)

## 2020-01-30 ENCOUNTER — Other Ambulatory Visit: Payer: Self-pay | Admitting: Family Medicine

## 2020-01-30 DIAGNOSIS — F411 Generalized anxiety disorder: Secondary | ICD-10-CM

## 2020-01-30 NOTE — Telephone Encounter (Signed)
Patient is requesting a refill of the following medications: Requested Prescriptions   Pending Prescriptions Disp Refills   sertraline (ZOLOFT) 50 MG tablet [Pharmacy Med Name: SERTRALINE HCL 50 MG TABLET] 135 tablet 1    Sig: TAKE 1 AND 1/2 TABLETS DAILY BY MOUTH    Date of patient request: 01/30/2020 Last office visit: 09/13/2019 Date of last refill: 12/09/2019 Last refill amount: 180 Follow up time period per chart: if fail to improve

## 2020-01-30 NOTE — Telephone Encounter (Signed)
Requested medication (s) are due for refill today: no  Requested medication (s) are on the active medication list: yes  Last refill:  11/03/2019  Future visit scheduled: yes  Notes to clinic:  review for refill Script is been requested from a different pharmacy    Requested Prescriptions  Pending Prescriptions Disp Refills   sertraline (ZOLOFT) 50 MG tablet [Pharmacy Med Name: SERTRALINE HCL 50 MG TABLET] 135 tablet 1    Sig: TAKE 1 AND 1/2 TABLETS DAILY BY MOUTH      Psychiatry:  Antidepressants - SSRI Passed - 01/30/2020  1:42 AM      Passed - Valid encounter within last 6 months    Recent Outpatient Visits           4 months ago Mid back pain   [facility], Lilia Argue, MD   4 months ago Mid back pain   [facility], Lilia Argue, MD   6 months ago Essential hypertension   [facility], Lilia Argue, MD   8 months ago Generalized anxiety disorder   [facility], Lilia Argue, MD   10 months ago Generalized anxiety disorder   [facility], Lilia Argue, MD       Future Appointments             In 3 days Rutherford Guys, MD [facility] at [place]

## 2020-02-02 ENCOUNTER — Encounter: Payer: Self-pay | Admitting: Family Medicine

## 2020-02-02 ENCOUNTER — Other Ambulatory Visit: Payer: Self-pay

## 2020-02-02 ENCOUNTER — Ambulatory Visit (INDEPENDENT_AMBULATORY_CARE_PROVIDER_SITE_OTHER): Payer: Medicare HMO | Admitting: Family Medicine

## 2020-02-02 VITALS — BP 132/80 | HR 64 | Temp 98.0°F | Ht 60.0 in | Wt 193.2 lb

## 2020-02-02 DIAGNOSIS — E785 Hyperlipidemia, unspecified: Secondary | ICD-10-CM | POA: Diagnosis not present

## 2020-02-02 DIAGNOSIS — I10 Essential (primary) hypertension: Secondary | ICD-10-CM | POA: Diagnosis not present

## 2020-02-02 DIAGNOSIS — F411 Generalized anxiety disorder: Secondary | ICD-10-CM

## 2020-02-02 DIAGNOSIS — E559 Vitamin D deficiency, unspecified: Secondary | ICD-10-CM | POA: Diagnosis not present

## 2020-02-02 NOTE — Progress Notes (Signed)
8/19/202111:25 AM  Alicia Parker Jun 25, 1945, 74 y.o., female [id number]  Chief Complaint  Patient presents with  . Hypertension  . Ear Problem    having pain for the past 4 days on the left outside ear, thinks it may be cpap strap    HPI:   Patient is a 74 y.o. female with past medical history significant for HTN, PAD, HLP, GAD, vitamin D deficiency, OSA on cpap who presents today for routine followup  Last OV march 2021 - no changes She is overall doing well Saw neuro and vascular in July 2021 Due for colonoscopy next year in may Having left side ear pain, outside, hearing ok, no drainage Thinks it might be related to cpap being too tight Reports anxiety well controlled of zoloft Takes pepcid prn gerd Has completed covid vaccine Will be getting flu vaccine at [facility]  Lab Results  Component Value Date   CREATININE 0.85 01/27/2020   BUN 11 01/27/2020   NA 140 01/27/2020   K 5.0 01/27/2020   CL 103 01/27/2020   CO2 23 01/27/2020   Lab Results  Component Value Date   CHOL 152 01/27/2020   HDL 72 01/27/2020   LDLCALC 67 01/27/2020   TRIG 64 01/27/2020   CHOLHDL 2.1 01/27/2020   Lab Results  Component Value Date   ALT 14 01/27/2020   AST 18 01/27/2020   ALKPHOS 134 (H) 01/27/2020   BILITOT 0.4 01/27/2020   Vitamin D 41  Depression screen [facility] 2/9 01/25/2020 09/13/2019 07/28/2019  Decreased Interest 0 0 0  Down, Depressed, Hopeless 0 0 0  PHQ - 2 Score 0 0 0  Altered sleeping - - -  Tired, decreased energy - - -  Change in appetite - - -  Feeling bad or failure about yourself  - - -  Trouble concentrating - - -  Moving slowly or fidgety/restless - - -  Suicidal thoughts - - -  PHQ-9 Score - - -    Fall Risk  02/02/2020 01/25/2020 09/13/2019 07/28/2019 05/09/2019  Falls in the past year? 0 0 0 0 0  Comment - - - - -  Number falls in past yr: 0 0 0 0 0  Injury with Fall? 0 0 0 0 0  Follow up - Falls evaluation completed;Education provided Falls evaluation  completed - -     No Known Allergies  Prior to Admission medications   Medication Sig Start Date End Date Taking? Authorizing Provider  amLODipine (NORVASC) 2.5 MG tablet Take 1 tablet (2.5 mg total) by mouth daily. 12/07/19  Yes Rutherford Guys, MD  aspirin EC 81 MG tablet Take 81 mg by mouth daily.   Yes [provider]  atorvastatin (LIPITOR) 20 MG tablet Take 1 tablet (20 mg total) by mouth daily. 12/07/19  Yes Rutherford Guys, MD  bismuth subsalicylate (PEPTO BISMOL) 262 MG/15ML suspension Take 30 mLs by mouth every 6 (six) hours as needed for indigestion.   Yes [provider]  clopidogrel (PLAVIX) 75 MG tablet Take 1 tablet (75 mg total) by mouth daily. 12/14/19  Yes Lorretta Harp, MD  famotidine (PEPCID) 20 MG tablet Take 1 tablet (20 mg total) by mouth 2 (two) times daily. 12/07/19  Yes Rutherford Guys, MD  Ibuprofen (ADVIL) 200 MG CAPS Take by mouth. OTC PRN   Yes [provider]  loratadine (CLARITIN) 10 MG tablet Take 10 mg by mouth at bedtime.   Yes [provider]  methocarbamol (ROBAXIN)  500 MG tablet Take 1 tablet (500 mg total) by mouth every 6 (six) hours as needed for muscle spasms. 09/13/19  Yes Rutherford Guys, MD  naproxen sodium (ALEVE) 220 MG tablet Take 440 mg by mouth daily as needed (headaches).   Yes [provider]  Omega-3 Fatty Acids (OMEGA 3 PO) Take 2 capsules by mouth 3 (three) times daily with meals.   Yes [provider]  sertraline (ZOLOFT) 50 MG tablet TAKE 1 AND 1/2 TABLETS DAILY BY MOUTH 01/30/20  Yes Rutherford Guys, MD  Vitamin D, Ergocalciferol, (DRISDOL) 1.25 MG (50000 UNIT) CAPS capsule TAKE 1 CAPSULE (50,000 UNITS TOTAL) BY MOUTH EVERY 7 (SEVEN) DAYS. 01/17/20  Yes Rutherford Guys, MD    Past Medical History:  Diagnosis Date  . Cataract   . Essential hypertension, benign   . GERD (gastroesophageal reflux disease)   . Hyperlipidemia    no current medications  . PAD (peripheral artery  disease) ([place]) 2020  . Sleep apnea    doesnt wear cpap     Past Surgical History:  Procedure Laterality Date  . ABDOMINAL AORTOGRAM W/LOWER EXTREMITY N/A 05/16/2019   Procedure: ABDOMINAL AORTOGRAM W/LOWER EXTREMITY;  Surgeon: Lorretta Harp, MD;  Location: [place] [facility];  Service: Cardiovascular;  Laterality: N/A;  . APPENDECTOMY  1968  . CHOLECYSTECTOMY  1968  . COLONOSCOPY  2004   polyps  . PERIPHERAL VASCULAR INTERVENTION  05/16/2019   Procedure: PERIPHERAL VASCULAR INTERVENTION;  Surgeon: Lorretta Harp, MD;  Location: [place] [facility];  Service: Cardiovascular;;  . REDUCTION MAMMAPLASTY Bilateral   . TOE SURGERY    . TUBAL LIGATION  1968  . UPPER GASTROINTESTINAL ENDOSCOPY      Social History   Tobacco Use  . Smoking status: Former Smoker    Packs/day: 1.00    Years: 10.00    Pack years: 10.00    Types: Cigarettes    Quit date: 11/2018    Years since quitting: 1.2  . Smokeless tobacco: Never Used  Substance Use Topics  . Alcohol use: Yes    Alcohol/week: 6.0 standard drinks    Types: 6 Cans of beer per week    Comment: daily-     Family History  Problem Relation Age of Onset  . Hypertension Mother   . Stroke Mother   . Hypertension Father   . Leukemia Father 27  . Early death Brother        pneumonia  . Hypertension Maternal Grandmother   . Early death Brother   . Colon polyps Sister   . Colon cancer Neg Hx   . Esophageal cancer Neg Hx   . Rectal cancer Neg Hx   . Stomach cancer Neg Hx     Review of Systems  Constitutional: Negative for chills and fever.  Respiratory: Negative for cough and shortness of breath.   Cardiovascular: Negative for chest pain, palpitations and leg swelling.  Gastrointestinal: Negative for abdominal pain, nausea and vomiting.     OBJECTIVE:  Today's Vitals   02/02/20 1113  BP: 132/80  Pulse: 64  Temp: 98 F (36.7 C)  SpO2: 97%  Weight: 193 lb 3.2 oz (87.6 kg)  Height: 5' (1.524 m)   Body mass  index is 37.73 kg/m.   Wt Readings from Last 3 Encounters:  02/02/20 193 lb 3.2 oz (87.6 kg)  01/25/20 193 lb (87.5 kg)  12/22/19 193 lb 6.4 oz (87.7 kg)     Physical Exam Vitals and nursing  note reviewed.  Constitutional:      Appearance: She is well-developed.  HENT:     Head: Normocephalic and atraumatic.     Mouth/Throat:     Pharynx: No oropharyngeal exudate.  Eyes:     General: No scleral icterus.    Extraocular Movements: Extraocular movements intact.     Conjunctiva/sclera: Conjunctivae normal.     Pupils: Pupils are equal, round, and reactive to light.  Cardiovascular:     Rate and Rhythm: Normal rate and regular rhythm.     Heart sounds: Normal heart sounds. No murmur heard.  No friction rub. No gallop.   Pulmonary:     Effort: Pulmonary effort is normal.     Breath sounds: Normal breath sounds. No wheezing, rhonchi or rales.  Musculoskeletal:     Cervical back: Neck supple.  Skin:    General: Skin is warm and dry.  Neurological:     Mental Status: She is alert and oriented to person, place, and time.     No results found for this or any previous visit (from the past 24 hour(s)).  No results found.   ASSESSMENT and Parker  1. Essential hypertension Controlled. Continue current regime.   2. Hyperlipidemia LDL goal <70 Controlled. Continue current regime.   3. Vitamin D deficiency At goal. Patient just picked up a new rx for high dose vitamin d. Discussed taking one q2 weeks. Once rx completed transition to OTC d3 2000 units a day  4. Generalized anxiety disorder Controlled. Continue current regime.   Return in about 6 months (around 08/04/2020).    Rutherford Guys, MD [facility] at [place], [place] [address] Ph.  [phone] Fax [phone]

## 2020-02-02 NOTE — Patient Instructions (Signed)
° ° ° °  If you have lab work done today you will be contacted with your lab results within the next 2 weeks.  If you have not heard from us then please contact us. The fastest way to get your results is to register for My Chart. ° ° °IF you received an x-ray today, you will receive an invoice from [facility]. Please contact [facility] at [phone] with questions or concerns regarding your invoice.  ° °IF you received labwork today, you will receive an invoice from [facility]. Please contact [facility] at [phone] with questions or concerns regarding your invoice.  ° °Our billing staff will not be able to assist you with questions regarding bills from these companies. ° °You will be contacted with the lab results as soon as they are available. The fastest way to get your results is to activate your My Chart account. Instructions are located on the last page of this paperwork. If you have not heard from us regarding the results in 2 weeks, please contact this office. °  ° ° ° °

## 2020-02-16 DIAGNOSIS — G4733 Obstructive sleep apnea (adult) (pediatric): Secondary | ICD-10-CM | POA: Diagnosis not present

## 2020-02-19 ENCOUNTER — Other Ambulatory Visit: Payer: Self-pay | Admitting: Family Medicine

## 2020-02-19 DIAGNOSIS — K219 Gastro-esophageal reflux disease without esophagitis: Secondary | ICD-10-CM

## 2020-02-23 ENCOUNTER — Other Ambulatory Visit: Payer: Self-pay | Admitting: Family Medicine

## 2020-02-23 DIAGNOSIS — I10 Essential (primary) hypertension: Secondary | ICD-10-CM

## 2020-02-28 DIAGNOSIS — H2513 Age-related nuclear cataract, bilateral: Secondary | ICD-10-CM | POA: Diagnosis not present

## 2020-03-17 DIAGNOSIS — G4733 Obstructive sleep apnea (adult) (pediatric): Secondary | ICD-10-CM | POA: Diagnosis not present

## 2020-03-27 ENCOUNTER — Ambulatory Visit: Payer: Medicare HMO | Attending: Internal Medicine

## 2020-03-27 DIAGNOSIS — Z23 Encounter for immunization: Secondary | ICD-10-CM

## 2020-03-27 NOTE — Progress Notes (Signed)
   Covid-19 Vaccination Clinic  Name:  Alicia Parker    MRN: [id number] DOB: 1945/09/24  03/27/2020  [name] was observed post Covid-19 immunization for 15 minutes without incident. She was provided with Vaccine Information Sheet and instruction to access the V-Safe system.   [name] was instructed to call 911 with any severe reactions post vaccine: Marland Kitchen Difficulty breathing  . Swelling of face and throat  . A fast heartbeat  . A bad rash all over body  . Dizziness and weakness

## 2020-04-04 ENCOUNTER — Other Ambulatory Visit: Payer: Self-pay | Admitting: Registered Nurse

## 2020-04-04 DIAGNOSIS — K219 Gastro-esophageal reflux disease without esophagitis: Secondary | ICD-10-CM

## 2020-04-04 MED ORDER — FAMOTIDINE 20 MG PO TABS: 20.0000 mg | ORAL_TABLET | Freq: Two times a day (BID) | ORAL | 1 refills | Status: DC

## 2020-04-04 NOTE — Telephone Encounter (Signed)
Pt did not [facility] in sept 2021. Pt is calling and needs new rx famotodine 20 mg #180 for 90 day refill send to [facility]

## 2020-04-04 NOTE — Telephone Encounter (Signed)
Requested Prescriptions  Pending Prescriptions Disp Refills   famotidine (PEPCID) 20 MG tablet 180 tablet 1    Sig: Take 1 tablet (20 mg total) by mouth 2 (two) times daily.     Gastroenterology:  H2 Antagonists Passed - 04/04/2020 12:26 PM      Passed - Valid encounter within last 12 months    Recent Outpatient Visits          2 months ago Essential hypertension   [facility] Lilia Argue, MD   6 months ago Mid back pain   [facility], Lilia Argue, MD   7 months ago Mid back pain   [facility], Lilia Argue, MD   8 months ago Essential hypertension   [facility], Lilia Argue, MD   11 months ago Generalized anxiety disorder   [facility], Lilia Argue, MD      Future Appointments            In 4 months Maximiano Coss, NP [facility] at [place], [facility]

## 2020-04-10 DIAGNOSIS — G4733 Obstructive sleep apnea (adult) (pediatric): Secondary | ICD-10-CM | POA: Diagnosis not present

## 2020-04-17 DIAGNOSIS — G4733 Obstructive sleep apnea (adult) (pediatric): Secondary | ICD-10-CM | POA: Diagnosis not present

## 2020-05-28 ENCOUNTER — Other Ambulatory Visit: Payer: Self-pay | Admitting: Registered Nurse

## 2020-05-28 DIAGNOSIS — Z1231 Encounter for screening mammogram for malignant neoplasm of breast: Secondary | ICD-10-CM

## 2020-06-04 ENCOUNTER — Ambulatory Visit: Payer: Medicare HMO

## 2020-06-11 ENCOUNTER — Other Ambulatory Visit: Payer: Medicare HMO

## 2020-06-11 DIAGNOSIS — Z20822 Contact with and (suspected) exposure to covid-19: Secondary | ICD-10-CM

## 2020-06-12 ENCOUNTER — Telehealth: Payer: Self-pay | Admitting: Family Medicine

## 2020-06-12 DIAGNOSIS — I10 Essential (primary) hypertension: Secondary | ICD-10-CM

## 2020-06-12 MED ORDER — AMLODIPINE BESYLATE 2.5 MG PO TABS: 2.5000 mg | ORAL_TABLET | Freq: Every day | ORAL | 0 refills | Status: DC

## 2020-06-12 NOTE — Telephone Encounter (Signed)
Medication Refill - Medication:  amLODipine (NORVASC) 2.5 MG tablet  90 tablet request   Has the patient contacted their pharmacy? Yes.   (Agent: If no, request that the patient contact the pharmacy for the refill.) (Agent: If yes, when and what did the pharmacy advise?)  Preferred Pharmacy (with phone number or street name):  [facility] - [place], [place] - [address]  [address] [place] [address]  Phone: [phone] Fax: [phone]     Agent: Please be advised that RX refills may take up to 3 business days. We ask that you follow-up with your pharmacy.

## 2020-06-12 NOTE — Telephone Encounter (Signed)
Pt called stating that she is no longer needing this refilled. Please advise.

## 2020-06-13 LAB — NOVEL CORONAVIRUS, NAA: SARS-CoV-2, NAA: DETECTED — AB

## 2020-06-13 LAB — SARS-COV-2, NAA 2 DAY TAT

## 2020-07-16 DIAGNOSIS — G4733 Obstructive sleep apnea (adult) (pediatric): Secondary | ICD-10-CM | POA: Diagnosis not present

## 2020-07-23 ENCOUNTER — Other Ambulatory Visit: Payer: Self-pay | Admitting: Registered Nurse

## 2020-07-23 DIAGNOSIS — E7800 Pure hypercholesterolemia, unspecified: Secondary | ICD-10-CM

## 2020-07-23 MED ORDER — ATORVASTATIN CALCIUM 20 MG PO TABS: 20.0000 mg | ORAL_TABLET | Freq: Every day | ORAL | 0 refills | Status: AC

## 2020-07-23 NOTE — Telephone Encounter (Signed)
Medication Refill - Medication: atorvastatin (LIPITOR) 20 MG tablet    Has the patient contacted their pharmacy? Yes.   (Agent: If no, request that the patient contact the pharmacy for the refill.) (Agent: If yes, when and what did the pharmacy advise?) Pharmacy stated they had reach out to office several times / please advise  Preferred Pharmacy (with phone number or street name): [facility]   Agent: Please be advised that RX refills may take up to 3 business days. We ask that you follow-up with your pharmacy.

## 2020-08-06 ENCOUNTER — Encounter: Payer: Self-pay | Admitting: Registered Nurse

## 2020-08-06 ENCOUNTER — Ambulatory Visit (INDEPENDENT_AMBULATORY_CARE_PROVIDER_SITE_OTHER): Payer: Medicare HMO | Admitting: Registered Nurse

## 2020-08-06 ENCOUNTER — Other Ambulatory Visit: Payer: Self-pay

## 2020-08-06 VITALS — BP 120/79 | HR 70 | Temp 97.9°F | Resp 18 | Ht 60.0 in | Wt 201.4 lb

## 2020-08-06 DIAGNOSIS — I10 Essential (primary) hypertension: Secondary | ICD-10-CM

## 2020-08-06 DIAGNOSIS — E559 Vitamin D deficiency, unspecified: Secondary | ICD-10-CM

## 2020-08-06 DIAGNOSIS — G8929 Other chronic pain: Secondary | ICD-10-CM

## 2020-08-06 DIAGNOSIS — Z7689 Persons encountering health services in other specified circumstances: Secondary | ICD-10-CM | POA: Diagnosis not present

## 2020-08-06 DIAGNOSIS — H9193 Unspecified hearing loss, bilateral: Secondary | ICD-10-CM | POA: Diagnosis not present

## 2020-08-06 DIAGNOSIS — M25511 Pain in right shoulder: Secondary | ICD-10-CM | POA: Diagnosis not present

## 2020-08-06 DIAGNOSIS — H6063 Unspecified chronic otitis externa, bilateral: Secondary | ICD-10-CM

## 2020-08-06 DIAGNOSIS — E785 Hyperlipidemia, unspecified: Secondary | ICD-10-CM | POA: Diagnosis not present

## 2020-08-06 MED ORDER — HYDROCORTISONE-ACETIC ACID 1-2 % OT SOLN
3.0000 [drp] | Freq: Two times a day (BID) | OTIC | 2 refills | Status: AC | PRN
Start: 2020-08-06 — End: ?

## 2020-08-06 MED ORDER — DICLOFENAC SODIUM 1 % EX GEL: 2.0000 g | Freq: Two times a day (BID) | CUTANEOUS | 2 refills | Status: AC | PRN

## 2020-08-06 NOTE — Progress Notes (Signed)
Established Patient Office Visit  Subjective:  Patient ID: Alicia Parker, female    DOB: 1946-05-01  Age: 75 y.o. MRN: [id number]  CC:  Chief Complaint  Patient presents with  . Transitions Of Care    Patient states she is here for a transfer of Care. Per patient she has been having some right shoulder pain and now some back pain.    HPI Alicia Parker presents for transfer of care from Dr. Pamella Pert to myself.  Histories reviewed, updated as warranted. Of note:  PAD Seen by Dr. Gwenlyn Found in the past. Last visit Dec 2020 telemed follow up for vascular intervention Did have virtual follow up in July 2021 with [facility]. Seems in person follow up requested but she has not done this, does not have one scheduled.  OSA Noncompliant with CPAP.  Managed with [facility] NP  HTN/HLD Well controlled with current regimen. Denies new CV symptoms.  Anxiety: Currently taking sertraline 75mg  PO qd. Good effect. No AEs. Has been on for some time.    Today, c/o shoulder pain: History of fall around 2 years ago with 2 torn ligaments Elected not to pursue surgery, went with PT instead Overall much improved, full ROM and strength, no radicular symptoms Does note some days there is pain and soreness occ takes aleve or ibuprofen with good effect. Continues to do PT exercises at home as well as stretching.  Obesity Discussing weight loss efforts Has started nutrisystem Well aware of diet and exercise changes that need to be made Has had a tough time during covid with sedentary lifestyle.  Hearing Loss: Ongoing. 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  Has not seen specialist.  Past Medical History:  Diagnosis Date  . Cataract   . Essential hypertension, benign   . GERD (gastroesophageal reflux disease)   . Hyperlipidemia    no  current medications  . PAD (peripheral artery disease) ([place]) 2020  . Sleep apnea    doesnt wear cpap     Past Surgical History:  Procedure Laterality Date  . ABDOMINAL AORTOGRAM W/LOWER EXTREMITY N/A 05/16/2019   Procedure: ABDOMINAL AORTOGRAM W/LOWER EXTREMITY;  Surgeon: Lorretta Harp, MD;  Location: [facility];  Service: Cardiovascular;  Laterality: N/A;  . APPENDECTOMY  1968  . CHOLECYSTECTOMY  1968  . COLONOSCOPY  2004   polyps  . PERIPHERAL VASCULAR INTERVENTION  05/16/2019   Procedure: PERIPHERAL VASCULAR INTERVENTION;  Surgeon: Lorretta Harp, MD;  Location: [facility];  Service: Cardiovascular;;  . REDUCTION MAMMAPLASTY Bilateral   . TOE SURGERY    . TUBAL LIGATION  1968  . UPPER GASTROINTESTINAL ENDOSCOPY      Family History  Problem Relation Age of Onset  . Hypertension Mother   . Stroke Mother   . Hypertension Father   . Leukemia Father 67  . Early death Brother        pneumonia  . Hypertension Maternal Grandmother   . Early death Brother   . Colon polyps Sister   . Colon cancer Neg Hx   . Esophageal cancer Neg Hx   . Rectal cancer Neg Hx   . Stomach cancer Neg Hx     Social History   Socioeconomic History  . Marital status: Married    Spouse name: Not on file  .  Number of children: Not on file  . Years of education: Not on file  . Highest education level: Not on file  Occupational History  . Not on file  Tobacco Use  . Smoking status: Former Smoker    Packs/day: 1.00    Years: 10.00    Pack years: 10.00    Types: Cigarettes    Quit date: 11/2018    Years since quitting: 1.7  . Smokeless tobacco: Never Used  Vaping Use  . Vaping Use: Never used  Substance and Sexual Activity  . Alcohol use: Yes    Alcohol/week: 6.0 standard drinks    Types: 6 Cans of beer per week    Comment: daily-   . Drug use: No  . Sexual activity: Yes    Partners: Male    Birth control/protection: Surgical    Comment: btl  Other Topics  Concern  . Not on file  Social History Narrative  . Not on file   Social Determinants of Health   Financial Resource Strain: Not on file  Food Insecurity: Not on file  Transportation Needs: Not on file  Physical Activity: Not on file  Stress: Not on file  Social Connections: Not on file  Intimate Partner Violence: Not on file    Outpatient Medications Prior to Visit  Medication Sig Dispense Refill  . amLODipine (NORVASC) 2.5 MG tablet Take 1 tablet (2.5 mg total) by mouth daily. 90 tablet 0  . aspirin EC 81 MG tablet Take 81 mg by mouth daily.    Marland Kitchen atorvastatin (LIPITOR) 20 MG tablet Take 1 tablet (20 mg total) by mouth daily. 90 tablet 0  . bismuth subsalicylate (PEPTO BISMOL) 262 MG/15ML suspension Take 30 mLs by mouth every 6 (six) hours as needed for indigestion.    . clopidogrel (PLAVIX) 75 MG tablet Take 1 tablet (75 mg total) by mouth daily. 90 tablet 3  . famotidine (PEPCID) 20 MG tablet Take 1 tablet (20 mg total) by mouth 2 (two) times daily. 180 tablet 1  . Ibuprofen 200 MG CAPS Take by mouth. OTC PRN    . loratadine (CLARITIN) 10 MG tablet Take 10 mg by mouth at bedtime.    . methocarbamol (ROBAXIN) 500 MG tablet Take 1 tablet (500 mg total) by mouth every 6 (six) hours as needed for muscle spasms. 30 tablet 3  . naproxen sodium (ALEVE) 220 MG tablet Take 440 mg by mouth daily as needed (headaches).    . Omega-3 Fatty Acids (OMEGA 3 PO) Take 2 capsules by mouth 3 (three) times daily with meals.    . sertraline (ZOLOFT) 50 MG tablet TAKE 1 AND 1/2 TABLETS DAILY BY MOUTH 135 tablet 1   No facility-administered medications prior to visit.    No Known Allergies  ROS Review of Systems  Constitutional: Negative.   HENT: Negative.   Eyes: Negative.   Respiratory: Negative.   Cardiovascular: Negative.   Gastrointestinal: Negative.   Genitourinary: Negative.   Musculoskeletal: Positive for arthralgias.  Skin: Negative.   Neurological: Negative.    Psychiatric/Behavioral: Negative.       Objective:    Physical Exam Vitals and nursing note reviewed.  Constitutional:      General: She is not in acute distress.    Appearance: Normal appearance. She is normal weight. She is not ill-appearing, toxic-appearing or diaphoretic.  HENT:     Right Ear: Tympanic membrane, ear canal and external ear normal. Decreased hearing noted.     Left Ear: Hearing, tympanic  membrane, ear canal and external ear normal.     Ears:     Weber exam findings: lateralizes left.    Right Rinne: AC > BC.    Left Rinne: AC > BC.    Comments: Question of some irritation in both canals. No drainage or lacerations. No cerumen impaction. Cardiovascular:     Rate and Rhythm: Normal rate and regular rhythm.     Heart sounds: Normal heart sounds. No murmur heard. No friction rub. No gallop.   Pulmonary:     Effort: Pulmonary effort is normal. No respiratory distress.     Breath sounds: Normal breath sounds. No stridor. No wheezing, rhonchi or rales.  Chest:     Chest wall: No tenderness.  Skin:    General: Skin is warm and dry.  Neurological:     General: No focal deficit present.     Mental Status: She is alert and oriented to person, place, and time. Mental status is at baseline.  Psychiatric:        Mood and Affect: Mood normal.        Behavior: Behavior normal.        Thought Content: Thought content normal.        Judgment: Judgment normal.     BP 120/79   Pulse 70   Temp 97.9 F (36.6 C) (Temporal)   Resp 18   Ht 5' (1.524 m)   Wt 201 lb 6.4 oz (91.4 kg)   LMP 06/16/1997   SpO2 97%   BMI 39.33 kg/m  Wt Readings from Last 3 Encounters:  08/06/20 201 lb 6.4 oz (91.4 kg)  02/02/20 193 lb 3.2 oz (87.6 kg)  01/25/20 193 lb (87.5 kg)     There are no preventive care reminders to display for this patient.  There are no preventive care reminders to display for this patient.  Lab Results  Component Value Date   TSH 3.410 01/25/2019    Lab Results  Component Value Date   WBC 5.2 09/11/2019   HGB 12.0 09/11/2019   HCT 39.4 09/11/2019   MCV 85.1 09/11/2019   PLT 287 09/11/2019   Lab Results  Component Value Date   NA 140 01/27/2020   K 5.0 01/27/2020   CO2 23 01/27/2020   GLUCOSE 96 01/27/2020   BUN 11 01/27/2020   CREATININE 0.85 01/27/2020   BILITOT 0.4 01/27/2020   ALKPHOS 134 (H) 01/27/2020   AST 18 01/27/2020   ALT 14 01/27/2020   PROT 6.3 01/27/2020   ALBUMIN 4.0 01/27/2020   CALCIUM 9.4 01/27/2020   ANIONGAP 12 09/11/2019   Lab Results  Component Value Date   CHOL 152 01/27/2020   Lab Results  Component Value Date   HDL 72 01/27/2020   Lab Results  Component Value Date   LDLCALC 67 01/27/2020   Lab Results  Component Value Date   TRIG 64 01/27/2020   Lab Results  Component Value Date   CHOLHDL 2.1 01/27/2020   Lab Results  Component Value Date   HGBA1C 5.5 11/24/2016      Assessment & Plan:   Problem List Items Addressed This Visit      Cardiovascular and Mediastinum   Essential hypertension   Relevant Orders   CBC With Differential   Comprehensive metabolic panel   Lipid panel   Hemoglobin A1c     Nervous and Auditory   Bilateral hearing loss    Other Visit Diagnoses    Encounter to establish care    -  Primary   Vitamin D deficiency       Relevant Orders   Vitamin D, 25-hydroxy   Hyperlipidemia LDL goal <70       Relevant Orders   CBC With Differential   Comprehensive metabolic panel   Lipid panel   Hemoglobin A1c   Chronic right shoulder pain       Relevant Medications   diclofenac Sodium (VOLTAREN) 1 % GEL      No orders of the defined types were placed in this encounter.   Follow-up: No follow-ups on file.   PLAN  Labs collected. Will follow up with the patient as warranted.  Encourage follow up with Dr. Gwenlyn Found  Discussed weight loss through diet and exercise. Offered referral to [facility]. Pt declined, will consider in  future after she makes further efforts on her own.  Discussed lifestyle modifications to help chronic conditions.  Diclofenac gel for shoulder pain. Suspect OA vs. Hx of soft tissue injury contributing. Full ROM and strength reassuring.  Hearing seems to lateralize somewhat but pt admits not much, feels like both ears are worse off than they had been previously - AC > BC bilaterally on Rinne. Will refer to audiology at [facility] for further work up.  Patient encouraged to call clinic with any questions, comments, or concerns.  Maximiano Coss, NP

## 2020-08-06 NOTE — Patient Instructions (Signed)
° ° ° °  If you have lab work done today you will be contacted with your lab results within the next 2 weeks.  If you have not heard from us then please contact us. The fastest way to get your results is to register for My Chart. ° ° °IF you received an x-ray today, you will receive an invoice from [facility]. Please contact  Radiology at [phone] with questions or concerns regarding your invoice.  ° °IF you received labwork today, you will receive an invoice from [facility]. Please contact [facility] at [phone] with questions or concerns regarding your invoice.  ° °Our billing staff will not be able to assist you with questions regarding bills from these companies. ° °You will be contacted with the lab results as soon as they are available. The fastest way to get your results is to activate your My Chart account. Instructions are located on the last page of this paperwork. If you have not heard from us regarding the results in 2 weeks, please contact this office. °  ° ° ° °

## 2020-08-07 ENCOUNTER — Encounter: Payer: Self-pay | Admitting: Registered Nurse

## 2020-08-07 LAB — COMPREHENSIVE METABOLIC PANEL
ALT: 15 IU/L (ref 0–32)
AST: 16 IU/L (ref 0–40)
Albumin/Globulin Ratio: 1.7 (ref 1.2–2.2)
Albumin: 4.1 g/dL (ref 3.7–4.7)
Alkaline Phosphatase: 138 IU/L — ABNORMAL HIGH (ref 44–121)
BUN/Creatinine Ratio: 16 (ref 12–28)
BUN: 15 mg/dL (ref 8–27)
Bilirubin Total: 0.5 mg/dL (ref 0.0–1.2)
CO2: 22 mmol/L (ref 20–29)
Calcium: 9.8 mg/dL (ref 8.7–10.3)
Chloride: 99 mmol/L (ref 96–106)
Creatinine, Ser: 0.92 mg/dL (ref 0.57–1.00)
GFR calc Af Amer: 71 mL/min/{1.73_m2} (ref >59)
GFR calc non Af Amer: 62 mL/min/{1.73_m2} (ref >59)
Globulin, Total: 2.4 g/dL (ref 1.5–4.5)
Glucose: 88 mg/dL (ref 65–99)
Potassium: 5 mmol/L (ref 3.5–5.2)
Sodium: 138 mmol/L (ref 134–144)
Total Protein: 6.5 g/dL (ref 6.0–8.5)

## 2020-08-07 LAB — CBC WITH DIFFERENTIAL
Basophils Absolute: 0 10*3/uL (ref 0.0–0.2)
Basos: 1 %
EOS (ABSOLUTE): 0.1 10*3/uL (ref 0.0–0.4)
Eos: 2 %
Hematocrit: 36.7 % (ref 34.0–46.6)
Hemoglobin: 11.6 g/dL (ref 11.1–15.9)
Immature Grans (Abs): 0 10*3/uL (ref 0.0–0.1)
Immature Granulocytes: 0 %
Lymphocytes Absolute: 1.5 10*3/uL (ref 0.7–3.1)
Lymphs: 28 %
MCH: 25.8 pg — ABNORMAL LOW (ref 26.6–33.0)
MCHC: 31.6 g/dL (ref 31.5–35.7)
MCV: 82 fL (ref 79–97)
Monocytes Absolute: 0.6 10*3/uL (ref 0.1–0.9)
Monocytes: 11 %
Neutrophils Absolute: 3.1 10*3/uL (ref 1.4–7.0)
Neutrophils: 58 %
RBC: 4.5 x10E6/uL (ref 3.77–5.28)
RDW: 13.7 % (ref 11.7–15.4)
WBC: 5.3 10*3/uL (ref 3.4–10.8)

## 2020-08-07 LAB — LIPID PANEL
Chol/HDL Ratio: 2 ratio (ref 0.0–4.4)
Cholesterol, Total: 131 mg/dL (ref 100–199)
HDL: 66 mg/dL (ref >39)
LDL Chol Calc (NIH): 53 mg/dL (ref 0–99)
Triglycerides: 53 mg/dL (ref 0–149)
VLDL Cholesterol Cal: 12 mg/dL (ref 5–40)

## 2020-08-07 LAB — HEMOGLOBIN A1C
Est. average glucose Bld gHb Est-mCnc: 128 mg/dL
Hgb A1c MFr Bld: 6.1 % — ABNORMAL HIGH (ref 4.8–5.6)

## 2020-08-07 LAB — VITAMIN D 25 HYDROXY (VIT D DEFICIENCY, FRACTURES): Vit D, 25-Hydroxy: 34.9 ng/mL (ref 30.0–100.0)

## 2020-08-22 ENCOUNTER — Other Ambulatory Visit: Payer: Self-pay | Admitting: Registered Nurse

## 2020-08-22 DIAGNOSIS — I10 Essential (primary) hypertension: Secondary | ICD-10-CM

## 2020-08-22 NOTE — Telephone Encounter (Signed)
Requested Prescriptions  Pending Prescriptions Disp Refills  . amLODipine (NORVASC) 2.5 MG tablet [Pharmacy Med Name: AMLODIPINE BESYLATE 2.5 MG Tablet] 90 tablet 1    Sig: TAKE 1 TABLET EVERY DAY     Cardiovascular:  Calcium Channel Blockers Passed - 08/22/2020  7:24 PM      Passed - Last BP in normal range    BP Readings from Last 1 Encounters:  08/06/20 120/79         Passed - Valid encounter within last 6 months    Recent Outpatient Visits          2 weeks ago Encounter to establish care   [facility] at [place], NP   6 months ago Essential hypertension   Primary Care at [facility], Lilia Argue, MD   11 months ago Mid back pain   Primary Care at [facility], Lilia Argue, MD   11 months ago Mid back pain   Primary Care at [facility], Lilia Argue, MD   1 year ago Essential hypertension   Primary Care at [facility], Lilia Argue, MD      Future Appointments            In 5 months Maximiano Coss, NP [facility], [facility]

## 2020-09-06 ENCOUNTER — Telehealth: Payer: Self-pay | Admitting: Registered Nurse

## 2020-09-06 DIAGNOSIS — D120 Benign neoplasm of cecum: Secondary | ICD-10-CM | POA: Diagnosis not present

## 2020-09-06 DIAGNOSIS — I10 Essential (primary) hypertension: Secondary | ICD-10-CM | POA: Diagnosis not present

## 2020-09-06 DIAGNOSIS — E7849 Other hyperlipidemia: Secondary | ICD-10-CM | POA: Diagnosis not present

## 2020-09-06 DIAGNOSIS — I739 Peripheral vascular disease, unspecified: Secondary | ICD-10-CM | POA: Diagnosis not present

## 2020-09-06 DIAGNOSIS — Z0000 Encounter for general adult medical examination without abnormal findings: Secondary | ICD-10-CM | POA: Diagnosis not present

## 2020-09-06 DIAGNOSIS — G4730 Sleep apnea, unspecified: Secondary | ICD-10-CM | POA: Diagnosis not present

## 2020-09-06 DIAGNOSIS — M545 Low back pain, unspecified: Secondary | ICD-10-CM | POA: Diagnosis not present

## 2020-09-06 NOTE — Telephone Encounter (Signed)
Patient called to request results of recent lab work be faxed to her new office. Fax number is [phone].  Advised patient to have the new provider's office fax [place] a request to have her records sent over.

## 2020-09-21 DIAGNOSIS — H524 Presbyopia: Secondary | ICD-10-CM | POA: Diagnosis not present

## 2020-09-24 ENCOUNTER — Telehealth: Payer: Self-pay

## 2020-09-24 ENCOUNTER — Encounter: Payer: Self-pay | Admitting: Physician Assistant

## 2020-09-24 ENCOUNTER — Ambulatory Visit: Payer: Medicare HMO | Admitting: Physician Assistant

## 2020-09-24 VITALS — BP 130/64 | HR 56 | Ht 63.0 in | Wt 205.8 lb

## 2020-09-24 DIAGNOSIS — K625 Hemorrhage of anus and rectum: Secondary | ICD-10-CM

## 2020-09-24 DIAGNOSIS — K6289 Other specified diseases of anus and rectum: Secondary | ICD-10-CM

## 2020-09-24 DIAGNOSIS — Z86010 Personal history of colonic polyps: Secondary | ICD-10-CM | POA: Diagnosis not present

## 2020-09-24 DIAGNOSIS — Z860101 Personal history of adenomatous and serrated colon polyps: Secondary | ICD-10-CM

## 2020-09-24 DIAGNOSIS — Z7901 Long term (current) use of anticoagulants: Secondary | ICD-10-CM

## 2020-09-24 MED ORDER — NA SULFATE-K SULFATE-MG SULF 17.5-3.13-1.6 GM/177ML PO SOLN: 1.0000 | Freq: Once | ORAL | 0 refills | Status: AC

## 2020-09-24 NOTE — Telephone Encounter (Signed)
[facility] Pre-operative Risk Assessment     Request for surgical clearance:     Endoscopy Procedure  What type of surgery is being performed?     Colonoscopy  When is this surgery scheduled?     11/01/20  What type of clearance is required ?   Pharmacy  Are there any medications that need to be held prior to surgery and how long? Plavix 5 days.  Practice name and name of physician performing surgery?      [facility]  What is your office phone and fax number?      Phone- [phone]  [phone]  Anesthesia type (None, local, MAC, general) ?       MAC

## 2020-09-24 NOTE — Patient Instructions (Signed)
If you are age 74 or older, your body mass index should be between 23-30. Your Body mass index is 36.46 kg/m. If this is out of the aforementioned range listed, please consider follow up with your Primary Care Provider.  If you are age 20 or younger, your body mass index should be between 19-25. Your Body mass index is 36.46 kg/m. If this is out of the aformentioned range listed, please consider follow up with your Primary Care Provider.   You have been scheduled for a colonoscopy. Please follow written instructions given to you at your visit today.  Please pick up your prep supplies at the pharmacy within the next 1-3 days. If you use inhalers (even only as needed), please bring them with you on the day of your procedure.  Thank you for choosing me and [facility].  Ellouise Newer, PA-C

## 2020-09-24 NOTE — Progress Notes (Signed)
Chief Complaint: Bright red blood per rectum, rectal pain, history of adenomatous polyps  HPI:    Alicia Parker is a 75 year old African-American female with past medical history as listed below including PAD on Plavix, known to Dr. Loletha Carrow, who was referred to me by Maximiano Coss, NP for a complaint of bright red blood per rectum, rectal pain and a history of adenomatous polyps.      10/22/2015 colonoscopy with 2 4 mm polyps in the distal transverse colon, one 8 mm polyp in the rectum, diverticulosis in left colon and internal hemorrhoids.  Pathology showed adenomatous polyps.  Repeat recommended in 5 years.    08/06/2020 CBC normal.    Today, the patient tells me that she is just due for her colonoscopy.  She was started on Plavix back in 2019 for her PVD which is new for her since having her last colonoscopy.  She has had to hold it before for about 5 days for other procedures.      Tells me that she has noticed some constipation off and on and typically when she is constipated she will occasionally see some bright red blood on the toilet paper when wiping.  Last time this happened was about 3 weeks ago.  Also describes that she will occasionally have some rectal pressure which typically occurs when she is lying down to sleep at night.  This is about the only time she experiences it and it typically passes within 10 to 15 minutes.  Denies any pain on passing a stool.    Denies fever, chills, weight loss or symptoms that awaken her from sleep.  Past Medical History:  Diagnosis Date  . Cataract   . Essential hypertension, benign   . GERD (gastroesophageal reflux disease)   . Hx of colonic polyps 2017  . Hyperlipidemia    no current medications  . PAD (peripheral artery disease) ([place]) 2020  . Sleep apnea    doesnt wear cpap     Past Surgical History:  Procedure Laterality Date  . ABDOMINAL AORTOGRAM W/LOWER EXTREMITY N/A 05/16/2019   Procedure: ABDOMINAL AORTOGRAM W/LOWER EXTREMITY;  Surgeon:  Lorretta Harp, MD;  Location: [facility];  Service: Cardiovascular;  Laterality: N/A;  . APPENDECTOMY  1968  . CHOLECYSTECTOMY  1968  . COLONOSCOPY  2004   polyps  . PERIPHERAL VASCULAR INTERVENTION  05/16/2019   Procedure: PERIPHERAL VASCULAR INTERVENTION;  Surgeon: Lorretta Harp, MD;  Location: [facility];  Service: Cardiovascular;;  . REDUCTION MAMMAPLASTY Bilateral   . TOE SURGERY    . TUBAL LIGATION  1968  . UPPER GASTROINTESTINAL ENDOSCOPY      Current Outpatient Medications  Medication Sig Dispense Refill  . acetic acid-hydrocortisone (VOSOL-HC) OTIC solution Place 3 drops into both ears 2 (two) times daily as needed. 10 mL 2  . amLODipine (NORVASC) 2.5 MG tablet TAKE 1 TABLET EVERY DAY 90 tablet 1  . aspirin EC 81 MG tablet Take 81 mg by mouth daily.    Marland Kitchen atorvastatin (LIPITOR) 20 MG tablet Take 1 tablet (20 mg total) by mouth daily. 90 tablet 0  . bismuth subsalicylate (PEPTO BISMOL) 262 MG/15ML suspension Take 30 mLs by mouth every 6 (six) hours as needed for indigestion.    . clopidogrel (PLAVIX) 75 MG tablet Take 1 tablet (75 mg total) by mouth daily. 90 tablet 3  . diclofenac Sodium (VOLTAREN) 1 % GEL Apply 2 g topically 2 (two) times daily as needed. 350 g 2  .  famotidine (PEPCID) 20 MG tablet Take 1 tablet (20 mg total) by mouth 2 (two) times daily. 180 tablet 1  . loratadine (CLARITIN) 10 MG tablet Take 10 mg by mouth at bedtime.    . methocarbamol (ROBAXIN) 500 MG tablet Take 1 tablet (500 mg total) by mouth every 6 (six) hours as needed for muscle spasms. 30 tablet 3  . Omega-3 Fatty Acids (OMEGA 3 PO) Take 2 capsules by mouth 3 (three) times daily with meals.    . sertraline (ZOLOFT) 50 MG tablet TAKE 1 AND 1/2 TABLETS DAILY BY MOUTH 135 tablet 1   No current facility-administered medications for this visit.    Allergies as of 09/24/2020  . (No Known Allergies)    Family History  Problem Relation Age of Onset  . Hypertension Mother   .  Stroke Mother   . Hypertension Father   . Leukemia Father 46  . Early death Brother        pneumonia  . Hypertension Maternal Grandmother   . Early death Brother   . Colon polyps Sister   . Colon cancer Neg Hx   . Esophageal cancer Neg Hx   . Rectal cancer Neg Hx   . Stomach cancer Neg Hx     Social History   Socioeconomic History  . Marital status: Married    Spouse name: Not on file  . Number of children: Not on file  . Years of education: Not on file  . Highest education level: Not on file  Occupational History  . Not on file  Tobacco Use  . Smoking status: Former Smoker    Packs/day: 1.00    Years: 10.00    Pack years: 10.00    Types: Cigarettes    Quit date: 11/2018    Years since quitting: 1.8  . Smokeless tobacco: Never Used  Vaping Use  . Vaping Use: Never used  Substance and Sexual Activity  . Alcohol use: Yes    Alcohol/week: 6.0 standard drinks    Types: 6 Cans of beer per week    Comment: daily-   . Drug use: No  . Sexual activity: Yes    Partners: Male    Birth control/protection: Surgical    Comment: btl  Other Topics Concern  . Not on file  Social History Narrative  . Not on file   Social Determinants of Health   Financial Resource Strain: Not on file  Food Insecurity: Not on file  Transportation Needs: Not on file  Physical Activity: Not on file  Stress: Not on file  Social Connections: Not on file  Intimate Partner Violence: Not on file    Review of Systems:    Constitutional: No weight loss, fever or chills Skin: No rash  Cardiovascular: No chest pain  Respiratory: No SOB  Gastrointestinal: See HPI and otherwise negative Genitourinary: No dysuria  Neurological: No headache Musculoskeletal: No new muscle or joint pain Hematologic: No bruising Psychiatric: No history of depression or anxiety   Physical Exam:  Vital signs: BP 130/64   Pulse (!) 56   Ht 5\' 3"  (1.6 m)   Wt 205 lb 12.8 oz (93.4 kg)   LMP 06/16/1997   SpO2 97%    BMI 36.46 kg/m   Constitutional:   Pleasant overweight AA female appears to be in NAD, Well developed, Well nourished, alert and cooperative Head:  Normocephalic and atraumatic. Eyes:   PEERL, EOMI. No icterus. Conjunctiva pink. Ears:  Normal auditory acuity. Neck:  Supple Throat:  Oral cavity and pharynx without inflammation, swelling or lesion.  Respiratory: Respirations even and unlabored. Lungs clear to auscultation bilaterally.   No wheezes, crackles, or rhonchi.  Cardiovascular: Normal S1, S2. No MRG. Regular rate and rhythm. No peripheral edema, cyanosis or pallor.  Gastrointestinal:  Soft, nondistended, nontender. No rebound or guarding. Normal bowel sounds. No appreciable masses or hepatomegaly. Rectal:  Not performed.  Msk:  Symmetrical without gross deformities. Without edema, no deformity or joint abnormality.  Neurologic:  Alert and  oriented x4;  grossly normal neurologically.  Skin:   Dry and intact without significant lesions or rashes. Psychiatric: Demonstrates good judgement and reason without abnormal affect or behaviors.  RELEVANT LABS AND IMAGING: CBC    Component Value Date/Time   WBC 5.3 08/06/2020 1011   WBC 5.2 09/11/2019 1533   RBC 4.50 08/06/2020 1011   RBC 4.63 09/11/2019 1533   HGB 11.6 08/06/2020 1011   HGB 13.4 03/31/2014 1112   HCT 36.7 08/06/2020 1011   PLT 287 09/11/2019 1533   PLT 285 04/26/2019 1212   MCV 82 08/06/2020 1011   MCH 25.8 (L) 08/06/2020 1011   MCH 25.9 (L) 09/11/2019 1533   MCHC 31.6 08/06/2020 1011   MCHC 30.5 09/11/2019 1533   RDW 13.7 08/06/2020 1011   LYMPHSABS 1.5 08/06/2020 1011   MONOABS 0.5 09/11/2019 1533   EOSABS 0.1 08/06/2020 1011   BASOSABS 0.0 08/06/2020 1011    CMP     Component Value Date/Time   NA 138 08/06/2020 1011   K 5.0 08/06/2020 1011   CL 99 08/06/2020 1011   CO2 22 08/06/2020 1011   GLUCOSE 88 08/06/2020 1011   GLUCOSE 91 09/11/2019 1533   BUN 15 08/06/2020 1011   CREATININE 0.92 08/06/2020  1011   CREATININE 1.09 (H) 04/25/2015 1706   CALCIUM 9.8 08/06/2020 1011   PROT 6.5 08/06/2020 1011   ALBUMIN 4.1 08/06/2020 1011   AST 16 08/06/2020 1011   ALT 15 08/06/2020 1011   ALKPHOS 138 (H) 08/06/2020 1011   BILITOT 0.5 08/06/2020 1011   GFRNONAA 62 08/06/2020 1011   GFRNONAA 52 (L) 04/25/2015 1706   GFRAA 71 08/06/2020 1011   GFRAA 60 04/25/2015 1706    Assessment: 1.  History of adenomatous polyps: Last colonoscopy 5 years ago, patient is due for repeat 2.  Chronic anticoagulation for PVD: On Plavix 3.  Bright red blood per rectum: Typically when she is constipated, hemorrhoid seen at time of last colonoscopy, last time this occurred was 3 weeks ago; most likely hemorrhoids 4.  Rectal pressure: Occasionally when she is laying in bed; consider proctalgia fugax versus constipation versus hemorrhoids  Plan: 1.  Discussed with patient that we will let Dr. Loletha Carrow evaluate her rectal bleeding and rectal pressure further at time of colonoscopy.  Scheduled patient for colonoscopy in the [place].  Did provide the patient with a detailed list of risks for the procedure and she agrees to proceed. 2.  Patient was advised to hold her Plavix for 5 days prior to time of procedure.  We will communicate with her prescribing physician to ensure this is acceptable for her. 3.  Patient to follow in clinic per recommendations from Dr. Loletha Carrow after time of procedure.  Ellouise Newer, PA-C [facility] 09/24/2020, 3:41 PM  Cc: Maximiano Coss, NP

## 2020-09-25 NOTE — Progress Notes (Signed)
____________________________________________________________  Attending physician addendum:  Thank you for sending this case to me. I have reviewed the entire note and agree with the plan.   Catalino Plascencia Danis, MD  ____________________________________________________________  

## 2020-09-27 NOTE — Telephone Encounter (Signed)
Pre-op request for colonoscopy asking to hold Plavix 5 days prior.   H/o HTN, OS on CPAP, dyslipidemia, prior smoking, PVD. She had left [facility] November 2929. She had dopplers 2021 showing patent stent, recommending 1 year follow-up. She was last seen 12/20/19 with leg pain due to back problems. No claudication symptoms.   Dr. Gwenlyn Found, please comment on Plavix and route to P CV DIV PREOP. Thanks!

## 2020-09-28 ENCOUNTER — Other Ambulatory Visit: Payer: Self-pay

## 2020-09-28 ENCOUNTER — Other Ambulatory Visit: Payer: Self-pay | Admitting: Internal Medicine

## 2020-09-28 ENCOUNTER — Ambulatory Visit
Admission: RE | Admit: 2020-09-28 | Discharge: 2020-09-28 | Disposition: A | Discharge status: Home / Self Care | Payer: Medicare HMO | Source: Ambulatory Visit | Attending: Registered Nurse | Admitting: Registered Nurse

## 2020-09-28 DIAGNOSIS — Z1231 Encounter for screening mammogram for malignant neoplasm of breast: Secondary | ICD-10-CM | POA: Diagnosis not present

## 2020-09-29 NOTE — Telephone Encounter (Signed)
OK to hold plavix for colonoscpoy

## 2020-10-11 ENCOUNTER — Telehealth: Payer: Self-pay | Admitting: Family Medicine

## 2020-10-11 NOTE — Telephone Encounter (Signed)
Noted. Have not seen as yet.

## 2020-10-11 NOTE — Telephone Encounter (Signed)
[facility]) need order form signed by physician for a oral sleep appliance. Faxing over form for physician's signature to [phone].  Contact info: Phone: [phone], option 2 Fax: [phone]

## 2020-10-11 NOTE — Telephone Encounter (Signed)
Called patient and left VM to call me back.

## 2020-10-12 DIAGNOSIS — Z0100 Encounter for examination of eyes and vision without abnormal findings: Secondary | ICD-10-CM | POA: Diagnosis not present

## 2020-10-15 NOTE — Telephone Encounter (Signed)
Called pt and asked about the interest in oral dental appliance for sleep.  She found this co on line.  She will call [facility] for supplies.  Last seen 12-2019.  She may need order for supplies and appt.  I will call her back on this.  Also about oral appliance if she would be candidate.  (may need appt to discuss).

## 2020-10-15 NOTE — Telephone Encounter (Signed)
Called and LMVM for pt to return call to set up appt for her to discuss oral dental device, but cpap golden standard for OSA.  Last seen 12-2018, not 12-2019 as initially stated.

## 2020-10-15 NOTE — Telephone Encounter (Signed)
Called patient and let her know to hold plavix 5 day before her procedure. Patient stated she would hold plavix 5 days before procedure.

## 2020-10-15 NOTE — Telephone Encounter (Signed)
I would be happy to discuss options with her as needed at follow up. CPAP is gold standard in management of sleep apnea. I will be happy to see her.

## 2020-10-18 DIAGNOSIS — R7303 Prediabetes: Secondary | ICD-10-CM | POA: Diagnosis not present

## 2020-10-18 DIAGNOSIS — M545 Low back pain, unspecified: Secondary | ICD-10-CM | POA: Diagnosis not present

## 2020-10-18 DIAGNOSIS — E7849 Other hyperlipidemia: Secondary | ICD-10-CM | POA: Diagnosis not present

## 2020-10-18 DIAGNOSIS — I10 Essential (primary) hypertension: Secondary | ICD-10-CM | POA: Diagnosis not present

## 2020-10-18 DIAGNOSIS — G4730 Sleep apnea, unspecified: Secondary | ICD-10-CM | POA: Diagnosis not present

## 2020-10-18 DIAGNOSIS — K219 Gastro-esophageal reflux disease without esophagitis: Secondary | ICD-10-CM | POA: Diagnosis not present

## 2020-10-18 DIAGNOSIS — F411 Generalized anxiety disorder: Secondary | ICD-10-CM | POA: Diagnosis not present

## 2020-10-24 ENCOUNTER — Encounter: Payer: Self-pay | Admitting: Gastroenterology

## 2020-11-01 ENCOUNTER — Other Ambulatory Visit: Payer: Self-pay

## 2020-11-01 ENCOUNTER — Ambulatory Visit (AMBULATORY_SURGERY_CENTER): Payer: Medicare HMO | Admitting: Gastroenterology

## 2020-11-01 ENCOUNTER — Encounter: Payer: Self-pay | Admitting: Gastroenterology

## 2020-11-01 VITALS — BP 130/66 | HR 60 | Temp 97.1°F | Resp 22 | Ht 63.0 in | Wt 205.0 lb

## 2020-11-01 DIAGNOSIS — K625 Hemorrhage of anus and rectum: Secondary | ICD-10-CM | POA: Diagnosis not present

## 2020-11-01 DIAGNOSIS — D123 Benign neoplasm of transverse colon: Secondary | ICD-10-CM

## 2020-11-01 DIAGNOSIS — Z86010 Personal history of colonic polyps: Secondary | ICD-10-CM | POA: Diagnosis not present

## 2020-11-01 MED ORDER — SODIUM CHLORIDE 0.9 % IV SOLN: 500.0000 mL | Freq: Once | INTRAVENOUS | Status: DC

## 2020-11-01 NOTE — Progress Notes (Signed)
pt tolerated well. VSS. awake and to recovery. Report given to RN.  

## 2020-11-01 NOTE — Patient Instructions (Signed)
Please read handouts provided. °Continue present medications. °Await pathology results. °Resume Plavix ( clopidogrel ) at prior dose tomorrow. ° ° ° ° °YOU HAD AN ENDOSCOPIC PROCEDURE TODAY AT THE [facility]:   Refer to the procedure report that was given to you for any specific questions about what was found during the examination.  If the procedure report does not answer your questions, please call your gastroenterologist to clarify.  If you requested that your care partner not be given the details of your procedure findings, then the procedure report has been included in a sealed envelope for you to review at your convenience later. ° °YOU SHOULD EXPECT: Some feelings of bloating in the abdomen. Passage of more gas than usual.  Walking can help get rid of the air that was put into your GI tract during the procedure and reduce the bloating. If you had a lower endoscopy (such as a colonoscopy or flexible sigmoidoscopy) you may notice spotting of blood in your stool or on the toilet paper. If you underwent a bowel prep for your procedure, you may not have a normal bowel movement for a few days. ° °Please Note:  You might notice some irritation and congestion in your nose or some drainage.  This is from the oxygen used during your procedure.  There is no need for concern and it should clear up in a day or so. ° °SYMPTOMS TO REPORT IMMEDIATELY: ° °· Following lower endoscopy (colonoscopy or flexible sigmoidoscopy): ° Excessive amounts of blood in the stool ° Significant tenderness or worsening of abdominal pains ° Swelling of the abdomen that is new, acute ° Fever of 100°F or higher ° ° °For urgent or emergent issues, a gastroenterologist can be reached at any hour by calling [phone]. °Do not use MyChart messaging for urgent concerns.  ° ° °DIET:  We do recommend a small meal at first, but then you may proceed to your regular diet.  Drink plenty of fluids but you should avoid alcoholic beverages  for 24 hours. ° °ACTIVITY:  You should plan to take it easy for the rest of today and you should NOT DRIVE or use heavy machinery until tomorrow (because of the sedation medicines used during the test).   ° °FOLLOW UP: °Our staff will call the number listed on your records 48-72 hours following your procedure to check on you and address any questions or concerns that you may have regarding the information given to you following your procedure. If we do not reach you, we will leave a message.  We will attempt to reach you two times.  During this call, we will ask if you have developed any symptoms of COVID 19. If you develop any symptoms (ie: fever, flu-like symptoms, shortness of breath, cough etc.) before then, please call [phone].  If you test positive for Covid 19 in the 2 weeks post procedure, please call and report this information to us.   ° °If any biopsies were taken you will be contacted by phone or by letter within the next 1-3 weeks.  Please call us at [phone] if you have not heard about the biopsies in 3 weeks.  ° ° °SIGNATURES/CONFIDENTIALITY: °You and/or your care partner have signed paperwork which will be entered into your electronic medical record.  These signatures attest to the fact that that the information above on your After Visit Summary has been reviewed and is understood.  Full responsibility of the confidentiality of this discharge information lies   with you and/or your care-partner. °

## 2020-11-01 NOTE — Progress Notes (Signed)
Called to room to assist during endoscopic procedure.  Patient ID and intended procedure confirmed with present staff. Received instructions for my participation in the procedure from the performing physician.  

## 2020-11-01 NOTE — Op Note (Signed)
[facility] Patient Name: Alicia Parker Procedure Date: 11/01/2020 2:21 PM MRN: [id number] Endoscopist: Mallie Mussel L. Loletha Carrow , MD Age: 75 Referring MD:  Date of Birth: 28-May-1946 Gender: Female Account #: [id number] Procedure:                Colonoscopy Indications:              Surveillance: Personal history of adenomatous                            polyps on last colonoscopy 5 years ago Medicines:                Monitored Anesthesia Care Procedure:                Pre-Anesthesia Assessment:                           - Prior to the procedure, a History and Physical                            was performed, and patient medications and                            allergies were reviewed. The patient's tolerance of                            previous anesthesia was also reviewed. The risks                            and benefits of the procedure and the sedation                            options and risks were discussed with the patient.                            All questions were answered, and informed consent                            was obtained. Prior Anticoagulants: The patient has                            taken Plavix (clopidogrel), last dose was 5 days                            prior to procedure. ASA Grade Assessment: III - A                            patient with severe systemic disease. After                            reviewing the risks and benefits, the patient was                            deemed in satisfactory condition to undergo the  procedure.                           After obtaining informed consent, the colonoscope                            was passed under direct vision. Throughout the                            procedure, the patient's blood pressure, pulse, and                            oxygen saturations were monitored continuously. The                            Olympus PCF-[id number] (#[id number]) Colonoscope was                             introduced through the anus and advanced to the the                            cecum, identified by appendiceal orifice and                            ileocecal valve. The colonoscopy was somewhat                            difficult due to a redundant colon. Successful                            completion of the procedure was aided by using                            manual pressure. The patient tolerated the                            procedure well. The quality of the bowel                            preparation was good. The ileocecal valve,                            appendiceal orifice, and rectum were photographed. Scope In: 2:35:24 PM Scope Out: 0:99:83 PM Scope Withdrawal Time: 0 hours 9 minutes 37 seconds  Total Procedure Duration: 0 hours 13 minutes 44 seconds  Findings:                 The perianal and digital rectal examinations were                            normal.                           A 3-4 mm polyp was found in the transverse colon.  The polyp was sessile. The polyp was removed with a                            cold snare. Resection and retrieval were complete.                           Multiple diverticula were found in the left colon.                           The exam was otherwise without abnormality on                            direct and retroflexion views. Complications:            No immediate complications. Estimated Blood Loss:     Estimated blood loss was minimal. Impression:               - One 3-4 mm polyp in the transverse colon, removed                            with a cold snare. Resected and retrieved.                           - Diverticulosis in the left colon.                           - The examination was otherwise normal on direct                            and retroflexion views. Recommendation:           - Patient has a contact number available for                            emergencies. The signs  and symptoms of potential                            delayed complications were discussed with the                            patient. Return to normal activities tomorrow.                            Written discharge instructions were provided to the                            patient.                           - Resume previous diet.                           - Resume Plavix (clopidogrel) at prior dose                            tomorrow.                           -  Await pathology results.                           - Based on age, today's findings and current                            guidelines, no repeat surveillance colonoscopy                            recommended. Karson Chicas L. Loletha Carrow, MD 11/01/2020 2:53:04 PM This report has been signed electronically.

## 2020-11-05 ENCOUNTER — Telehealth: Payer: Self-pay

## 2020-11-05 NOTE — Telephone Encounter (Signed)
  Follow up Call-  Call back number 11/01/2020  Post procedure Call Back phone  # [phone]  Permission to leave phone message Yes  Some recent data might be hidden    1st follow up call made.  No answer and no voicemail.

## 2020-11-05 NOTE — Telephone Encounter (Signed)
  Follow up Call-  Call back number 11/01/2020  Post procedure Call Back phone  # [phone]  Permission to leave phone message Yes  Some recent data might be hidden     Patient questions:  Do you have a fever, pain , or abdominal swelling? No. Pain Score  0 *  Have you tolerated food without any problems? Yes.    Have you been able to return to your normal activities? Yes.    Do you have any questions about your discharge instructions: Diet   No. Medications  No. Follow up visit  No.  Do you have questions or concerns about your Care? No.  Actions: * If pain score is 4 or above: No action needed, pain <4.

## 2020-11-11 ENCOUNTER — Encounter: Payer: Self-pay | Admitting: Gastroenterology

## 2020-11-13 ENCOUNTER — Other Ambulatory Visit: Payer: Self-pay | Admitting: Registered Nurse

## 2020-11-13 DIAGNOSIS — E7800 Pure hypercholesterolemia, unspecified: Secondary | ICD-10-CM

## 2020-11-20 DIAGNOSIS — K219 Gastro-esophageal reflux disease without esophagitis: Secondary | ICD-10-CM | POA: Diagnosis not present

## 2020-11-20 DIAGNOSIS — I10 Essential (primary) hypertension: Secondary | ICD-10-CM | POA: Diagnosis not present

## 2020-11-20 DIAGNOSIS — E7849 Other hyperlipidemia: Secondary | ICD-10-CM | POA: Diagnosis not present

## 2020-11-20 DIAGNOSIS — G4730 Sleep apnea, unspecified: Secondary | ICD-10-CM | POA: Diagnosis not present

## 2020-11-22 ENCOUNTER — Other Ambulatory Visit (HOSPITAL_COMMUNITY): Payer: Self-pay | Admitting: Cardiovascular Disease

## 2020-11-22 DIAGNOSIS — Z95820 Peripheral vascular angioplasty status with implants and grafts: Secondary | ICD-10-CM

## 2020-11-22 DIAGNOSIS — I739 Peripheral vascular disease, unspecified: Secondary | ICD-10-CM

## 2020-11-26 ENCOUNTER — Other Ambulatory Visit: Payer: Self-pay | Admitting: Registered Nurse

## 2020-11-26 DIAGNOSIS — K219 Gastro-esophageal reflux disease without esophagitis: Secondary | ICD-10-CM

## 2020-11-27 DIAGNOSIS — G4733 Obstructive sleep apnea (adult) (pediatric): Secondary | ICD-10-CM | POA: Diagnosis not present

## 2020-11-29 ENCOUNTER — Ambulatory Visit (HOSPITAL_COMMUNITY)
Admission: RE | Admit: 2020-11-29 | Discharge: 2020-11-29 | Disposition: A | Discharge status: Home / Self Care | Payer: Medicare HMO | Source: Ambulatory Visit | Attending: Internal Medicine | Admitting: Internal Medicine

## 2020-11-29 ENCOUNTER — Other Ambulatory Visit: Payer: Self-pay

## 2020-11-29 ENCOUNTER — Other Ambulatory Visit (HOSPITAL_COMMUNITY): Payer: Self-pay | Admitting: Cardiovascular Disease

## 2020-11-29 DIAGNOSIS — I739 Peripheral vascular disease, unspecified: Secondary | ICD-10-CM | POA: Insufficient documentation

## 2020-11-29 DIAGNOSIS — Z95820 Peripheral vascular angioplasty status with implants and grafts: Secondary | ICD-10-CM

## 2020-12-10 DIAGNOSIS — Z8249 Family history of ischemic heart disease and other diseases of the circulatory system: Secondary | ICD-10-CM | POA: Diagnosis not present

## 2020-12-10 DIAGNOSIS — M1990 Unspecified osteoarthritis, unspecified site: Secondary | ICD-10-CM | POA: Diagnosis not present

## 2020-12-10 DIAGNOSIS — K219 Gastro-esophageal reflux disease without esophagitis: Secondary | ICD-10-CM | POA: Diagnosis not present

## 2020-12-10 DIAGNOSIS — I739 Peripheral vascular disease, unspecified: Secondary | ICD-10-CM | POA: Diagnosis not present

## 2020-12-10 DIAGNOSIS — Z7902 Long term (current) use of antithrombotics/antiplatelets: Secondary | ICD-10-CM | POA: Diagnosis not present

## 2020-12-10 DIAGNOSIS — E785 Hyperlipidemia, unspecified: Secondary | ICD-10-CM | POA: Diagnosis not present

## 2020-12-10 DIAGNOSIS — I10 Essential (primary) hypertension: Secondary | ICD-10-CM | POA: Diagnosis not present

## 2020-12-10 DIAGNOSIS — Z95820 Peripheral vascular angioplasty status with implants and grafts: Secondary | ICD-10-CM | POA: Diagnosis not present

## 2020-12-10 DIAGNOSIS — F419 Anxiety disorder, unspecified: Secondary | ICD-10-CM | POA: Diagnosis not present

## 2020-12-10 DIAGNOSIS — Z6841 Body Mass Index (BMI) 40.0 and over, adult: Secondary | ICD-10-CM | POA: Diagnosis not present

## 2020-12-10 DIAGNOSIS — Z87891 Personal history of nicotine dependence: Secondary | ICD-10-CM | POA: Diagnosis not present

## 2020-12-27 ENCOUNTER — Other Ambulatory Visit: Payer: Self-pay | Admitting: Cardiovascular Disease

## 2021-01-12 DIAGNOSIS — G4733 Obstructive sleep apnea (adult) (pediatric): Secondary | ICD-10-CM | POA: Diagnosis not present

## 2021-01-15 DIAGNOSIS — G4730 Sleep apnea, unspecified: Secondary | ICD-10-CM | POA: Diagnosis not present

## 2021-01-15 DIAGNOSIS — I10 Essential (primary) hypertension: Secondary | ICD-10-CM | POA: Diagnosis not present

## 2021-01-15 DIAGNOSIS — R7303 Prediabetes: Secondary | ICD-10-CM | POA: Diagnosis not present

## 2021-01-15 DIAGNOSIS — E7849 Other hyperlipidemia: Secondary | ICD-10-CM | POA: Diagnosis not present

## 2021-02-04 ENCOUNTER — Ambulatory Visit: Payer: Self-pay | Admitting: Registered Nurse

## 2021-02-05 ENCOUNTER — Other Ambulatory Visit: Payer: Self-pay | Admitting: Registered Nurse

## 2021-02-05 DIAGNOSIS — E7800 Pure hypercholesterolemia, unspecified: Secondary | ICD-10-CM

## 2021-03-02 DIAGNOSIS — G4733 Obstructive sleep apnea (adult) (pediatric): Secondary | ICD-10-CM | POA: Diagnosis not present

## 2021-03-05 ENCOUNTER — Telehealth: Payer: Self-pay | Admitting: Family Medicine

## 2021-03-05 NOTE — Telephone Encounter (Signed)
Pt would like a call to discuss if she is due for another at home sleep study, please call.

## 2021-03-05 NOTE — Patient Instructions (Addendum)
Please continue using your CPAP regularly. While your insurance requires that you use CPAP at least 4 hours each night on 70% of the nights, I recommend, that you not skip any nights and use it throughout the night if you can. Getting used to CPAP and staying with the treatment long term does take time and patience and discipline. Untreated obstructive sleep apnea when it is moderate to severe can have an adverse impact on cardiovascular health and raise her risk for heart disease, arrhythmias, hypertension, congestive heart failure, stroke and diabetes. Untreated obstructive sleep apnea causes sleep disruption, nonrestorative sleep, and sleep deprivation. This can have an impact on your day to day functioning and cause daytime sleepiness and impairment of cognitive function, memory loss, mood disturbance, and problems focussing. Using CPAP regularly can improve these symptoms.  Follow up in 1 year   DME: [facility] Phone: [phone]

## 2021-03-05 NOTE — Telephone Encounter (Signed)
Called the patient and advised that we typically dont need to repeat a sleep study unless the patient is having some concerns. Pt is still using her res med machine and feels like there could be something off with the air. Since July 2021 was when last seen, informed her we could work her in to have her download assessed. She accepted an apt for tomorrow 9/21 at 10:30 am with Amy L, NP

## 2021-03-05 NOTE — Progress Notes (Signed)
PATIENT: Alicia Parker DOB: 05/08/1946  REASON FOR VISIT: follow up HISTORY FROM: patient  Chief Complaint  Patient presents with   Follow-up    Pt alone, rm 10. Following up yearly for cpap. She is not a fan of using the machine       HISTORY OF PRESENT ILLNESS: 03/06/21 ALL:  Alicia Parker returns for follow up for OSA on CPAP. She was last seen 12/2019 and having difficulty an air leak, however, compliance had improved and AHI was well managed. Since, she has continued CPAP. She called yesterday to report "something was off with the air". She feels that she doesn't get enough air at times. She doesn't feel significantly different but does note that she does not rest as well if she does not use her CPAP.     12/22/2019 ALL:  Alicia Parker is a 75 y.o. female here today for follow up for OSA on CPAP. She is doing better with compliance. She continues to struggle with getting comfortable with machine. She has noted a leak in her mask. She is able to correct it when she tightens her headgear. She does feel better when using CPAP. She was unable to go to sleep one night this week after removing her mask. She fell back to sleep quickly once replacing it.   Compliance report dated 11/21/2019 through 12/20/2019 reveals that she used CPAP 30 of the past 30 days for compliance of 100%.  She used CPAP greater than 4 hours all 30 days.  Average usage was 6 hours and 40 minutes.  Visual AHI was 5.3 on 7 to 14 cm of water and an EPR of three.  There was a significant leak noted in the 95th percentile of 40.7.  HISTORY: (copied from my note on 06/23/2019)  Alicia Parker is a 75 y.o. female here today for follow up. Alicia Parker returns today for initial compliance review after being diagnosed with severe obstructive sleep apnea. She was started on CPAP about 2 months ago. She reports that she is doing well on CPAP. She does note improvement in energy. There are some mornings where she has not noticed much  benefit with using CPAP. She is motivated to continue using CPAP. She is having no difficulties with the machine or supplies.   Compliance report dated 05/23/2019 through 06/21/2019 reveals that she use CPAP every night the last 30 days for compliance of 100%. She CPAP greater than 4 hours every night. Average usage was 6 hours and 17 minutes. Residual AHI was 4 on 7 to 14 cm of water and an EPR of 3. There was no significant leak.   HISTORY: (copied from Dr Guadelupe Sabin note on 03/03/2019)   Dear Dr. [name], I saw your patient, Alicia Parker, upon your kind request in my sleep clinic today for initial consultation of her sleep disorder, in particular, reevaluation of her prior diagnosis of obstructive sleep apnea.  The patient is unaccompanied today.  As you know, [name] is a 75 year old right-handed woman with an underlying medical history of reflux disease, anxiety, hypertension, hyperlipidemia, cataracts and obesity, who was previously diagnosed with obstructive sleep apnea and placed on CPAP therapy.  Prior sleep study results are not available for my review today.  She has not been using her CPAP machine in some years.  She has had some weight gain over time.  I reviewed your office note from 02/18/2019.  Her Epworth sleepiness score is 17 out of 24, fatigue severity score  is 54 out of 63.  She is married and lives with her husband and daughter.  She has 3 children.  She quit smoking in June 2020.  She drinks alcohol in the form of beer, about 2/day but is cutting back, and caffeine in the form of coffee, about 1 or 1-1/2 cups/day on average.  She does not typically drink soda on a day-to-day basis or tea.  She has no pets, she has a TV in the bedroom and falls asleep with the TV on.  She is generally in bed before 9 and rise time varies, between 7 and 10.  She is retired from Scientist, research (medical) work.  She reports that her husband has PTSD and is a very restless sleeper and she is planning to separate their bedrooms as  none of them are sleeping well.  She has a family history of sleep apnea and her brother who has a CPAP machine and her son.  She has 2 sons and 1 daughter.  She estimates that she has not been on CPAP therapy since 2004, she reports that her machine was from 2000.  She had difficulty with the full facemask.  She would be willing to get retested and consider CPAP therapy again.  She denies recurrent morning headaches but has nocturia about 2 or 3 times per average night and also allergy symptoms.    REVIEW OF SYSTEMS: Out of a complete 14 system review of symptoms, none the patient complains only of the following symptoms, none and all other reviewed systems are negative.  ESS: 5  ALLERGIES: No Known Allergies  HOME MEDICATIONS: Outpatient Medications Prior to Visit  Medication Sig Dispense Refill   acetic acid-hydrocortisone (VOSOL-HC) OTIC solution Place 3 drops into both ears 2 (two) times daily as needed. 10 mL 2   amLODipine (NORVASC) 2.5 MG tablet TAKE 1 TABLET EVERY DAY 90 tablet 1   aspirin EC 81 MG tablet Take 81 mg by mouth daily.     atorvastatin (LIPITOR) 20 MG tablet Take 1 tablet (20 mg total) by mouth daily. 90 tablet 0   bismuth subsalicylate (PEPTO BISMOL) 262 MG/15ML suspension Take 30 mLs by mouth every 6 (six) hours as needed for indigestion.     clopidogrel (PLAVIX) 75 MG tablet TAKE 1 TABLET EVERY DAY 90 tablet 3   diclofenac Sodium (VOLTAREN) 1 % GEL Apply 2 g topically 2 (two) times daily as needed. 350 g 2   famotidine (PEPCID) 20 MG tablet TAKE 1 TABLET TWICE DAILY 180 tablet 1   loratadine (CLARITIN) 10 MG tablet Take 10 mg by mouth at bedtime.     methocarbamol (ROBAXIN) 500 MG tablet Take 1 tablet (500 mg total) by mouth every 6 (six) hours as needed for muscle spasms. 30 tablet 3   Omega-3 Fatty Acids (OMEGA 3 PO) Take 2 capsules by mouth 3 (three) times daily with meals.     sertraline (ZOLOFT) 50 MG tablet TAKE 1 AND 1/2 TABLETS DAILY BY MOUTH 135 tablet 1    No facility-administered medications prior to visit.    PAST MEDICAL HISTORY: Past Medical History:  Diagnosis Date   Anxiety    Cataract    Clotting disorder ([place])    Essential hypertension, benign    GERD (gastroesophageal reflux disease)    Hx of colonic polyps 2017   Hyperlipidemia    no current medications   PAD (peripheral artery disease) ([place]) 2020   Sleep apnea    doesnt wear cpap  PAST SURGICAL HISTORY: Past Surgical History:  Procedure Laterality Date   ABDOMINAL AORTOGRAM W/LOWER EXTREMITY N/A 05/16/2019   Procedure: ABDOMINAL AORTOGRAM W/LOWER EXTREMITY;  Surgeon: Lorretta Harp, MD;  Location: [facility];  Service: Cardiovascular;  Laterality: N/A;   [place]   COLONOSCOPY  2004   polyps   PERIPHERAL VASCULAR INTERVENTION  05/16/2019   Procedure: PERIPHERAL VASCULAR INTERVENTION;  Surgeon: Lorretta Harp, MD;  Location: [facility];  Service: Cardiovascular;;   REDUCTION MAMMAPLASTY Bilateral    TOE SURGERY     TUBAL LIGATION  1968   UPPER GASTROINTESTINAL ENDOSCOPY      FAMILY HISTORY: Family History  Problem Relation Age of Onset   Hypertension Mother    Stroke Mother    Hypertension Father    Leukemia Father 58   Early death Brother        pneumonia   Hypertension Maternal Grandmother    Early death Brother    Colon polyps Sister    Colon cancer Neg Hx    Esophageal cancer Neg Hx    Rectal cancer Neg Hx    Stomach cancer Neg Hx     SOCIAL HISTORY: Social History   Socioeconomic History   Marital status: Married    Spouse name: Not on file   Number of children: Not on file   Years of education: Not on file   Highest education level: Not on file  Occupational History   Not on file  Tobacco Use   Smoking status: Former    Packs/day: 1.00    Years: 10.00    Pack years: 10.00    Types: Cigarettes    Quit date: 11/2018    Years since quitting: 2.3   Smokeless tobacco: Never   Vaping Use   Vaping Use: Never used  Substance and Sexual Activity   Alcohol use: Yes    Alcohol/week: 6.0 standard drinks    Types: 6 Cans of beer per week    Comment: daily-    Drug use: No   Sexual activity: Yes    Partners: Male    Birth control/protection: Surgical    Comment: btl  Other Topics Concern   Not on file  Social History Narrative   Not on file   Social Determinants of Health   Financial Resource Strain: Not on file  Food Insecurity: Not on file  Transportation Needs: Not on file  Physical Activity: Not on file  Stress: Not on file  Social Connections: Not on file  Intimate Partner Violence: Not on file      PHYSICAL EXAM  Vitals:   03/06/21 1023  BP: (!) 117/58  Pulse: (!) 56  Weight: 210 lb (95.3 kg)  Height: 5' (1.524 m)    Body mass index is 41.01 kg/m.  Generalized: Well developed, in no acute distress  Cardiology: normal rate and rhythm, no murmur noted Respiratory: clear to auscultation bilaterally  Neurological examination  Mentation: Alert oriented to time, place, history taking. Follows all commands speech and language fluent Cranial nerve II-XII: Pupils were equal round reactive to light. Extraocular movements were full, visual field were full  Motor: The motor testing reveals 5 over 5 strength of all 4 extremities. Good symmetric motor tone is noted throughout.  Gait and station: Gait is normal.   DIAGNOSTIC DATA (LABS, IMAGING, TESTING) - I reviewed patient records, labs, notes, testing and imaging myself where available.  No flowsheet data found.  Lab Results  Component Value Date   WBC 5.3 08/06/2020   HGB 11.6 08/06/2020   HCT 36.7 08/06/2020   MCV 82 08/06/2020   PLT 287 09/11/2019      Component Value Date/Time   NA 138 08/06/2020 1011   K 5.0 08/06/2020 1011   CL 99 08/06/2020 1011   CO2 22 08/06/2020 1011   GLUCOSE 88 08/06/2020 1011   GLUCOSE 91 09/11/2019 1533   BUN 15 08/06/2020 1011   CREATININE 0.92  08/06/2020 1011   CREATININE 1.09 (H) 04/25/2015 1706   CALCIUM 9.8 08/06/2020 1011   PROT 6.5 08/06/2020 1011   ALBUMIN 4.1 08/06/2020 1011   AST 16 08/06/2020 1011   ALT 15 08/06/2020 1011   ALKPHOS 138 (H) 08/06/2020 1011   BILITOT 0.5 08/06/2020 1011   GFRNONAA 62 08/06/2020 1011   GFRNONAA 52 (L) 04/25/2015 1706   GFRAA 71 08/06/2020 1011   GFRAA 60 04/25/2015 1706   Lab Results  Component Value Date   CHOL 131 08/06/2020   HDL 66 08/06/2020   LDLCALC 53 08/06/2020   TRIG 53 08/06/2020   CHOLHDL 2.0 08/06/2020   Lab Results  Component Value Date   HGBA1C 6.1 (H) 08/06/2020   No results found for: VITAMINB12 Lab Results  Component Value Date   TSH 3.410 01/25/2019       ASSESSMENT AND PLAN 75 y.o. year old female  has a past medical history of Anxiety, Cataract, Clotting disorder ([place]), Essential hypertension, benign, GERD (gastroesophageal reflux disease), colonic polyps (2017), Hyperlipidemia, PAD (peripheral artery disease) ([place]) (2020), and Sleep apnea. here with     ICD-10-CM   1. OSA on CPAP  G47.33 For home use only DME continuous positive airway pressure (CPAP)   Z99.89 For home use only DME continuous positive airway pressure (CPAP)       Abbygail is doing better on CPAP therapy. She is no longer fighting with her mask and headgear. Although she doesn't feel significantly different on CPAP, she does note benefit with improved sleep quality. I will increase maximum pressure to 16cmH20 to see if this helps with her concerns of not getting enough air. Compliance report shows excellent compliance. I have encouraged her to continue nightly use and use greater than 4 hours each night. Healthy lifestyle habits encouraged. She will follow up with me in 6-12 months, sooner if needed. She verbalizes understanding and agreement with this plan.    Orders Placed This Encounter  Procedures   For home use only DME continuous positive airway pressure (CPAP)    Supplies     Order Specific Question:   Length of Need    Answer:   Lifetime    Order Specific Question:   Patient has OSA or probable OSA    Answer:   Yes    Order Specific Question:   Is the patient currently using CPAP in the home    Answer:   Yes    Order Specific Question:   Settings    Answer:   Other see comments    Order Specific Question:   CPAP supplies needed    Answer:   Mask, headgear, cushions, filters, heated tubing and water chamber   For home use only DME continuous positive airway pressure (CPAP)    Please adjust maximum pressure to 16cmH20.    Order Specific Question:   Length of Need    Answer:   Lifetime    Order Specific Question:   Patient has OSA or  probable OSA    Answer:   Yes    Order Specific Question:   Is the patient currently using CPAP in the home    Answer:   Yes    Order Specific Question:   Settings    Answer:   Other see comments    Order Specific Question:   CPAP supplies needed    Answer:   Mask, headgear, cushions, filters, heated tubing and water chamber      No orders of the defined types were placed in this encounter.     Debbora Presto, FNP-C 03/06/2021, 12:06 PM [facility] [address], [place], [place] [address] [phone]

## 2021-03-06 ENCOUNTER — Ambulatory Visit: Payer: Medicare HMO | Admitting: Family Medicine

## 2021-03-06 ENCOUNTER — Encounter: Payer: Self-pay | Admitting: Family Medicine

## 2021-03-06 VITALS — BP 117/58 | HR 56 | Ht 60.0 in | Wt 210.0 lb

## 2021-03-06 DIAGNOSIS — G4733 Obstructive sleep apnea (adult) (pediatric): Secondary | ICD-10-CM

## 2021-03-06 DIAGNOSIS — Z9989 Dependence on other enabling machines and devices: Secondary | ICD-10-CM

## 2021-03-22 DIAGNOSIS — I10 Essential (primary) hypertension: Secondary | ICD-10-CM | POA: Diagnosis not present

## 2021-03-22 DIAGNOSIS — F411 Generalized anxiety disorder: Secondary | ICD-10-CM | POA: Diagnosis not present

## 2021-03-22 DIAGNOSIS — R600 Localized edema: Secondary | ICD-10-CM | POA: Diagnosis not present

## 2021-03-22 DIAGNOSIS — M545 Low back pain, unspecified: Secondary | ICD-10-CM | POA: Diagnosis not present

## 2021-03-22 DIAGNOSIS — R7303 Prediabetes: Secondary | ICD-10-CM | POA: Diagnosis not present

## 2021-03-22 DIAGNOSIS — Z23 Encounter for immunization: Secondary | ICD-10-CM | POA: Diagnosis not present

## 2021-07-03 ENCOUNTER — Ambulatory Visit: Payer: Medicare HMO | Admitting: Family Medicine

## 2021-07-18 ENCOUNTER — Other Ambulatory Visit: Payer: Self-pay | Admitting: Internal Medicine

## 2021-07-21 LAB — COMPLETE METABOLIC PANEL WITH GFR
AG Ratio: 1.6 (calc) (ref 1.0–2.5)
ALT: 14 U/L (ref 6–29)
AST: 15 U/L (ref 10–35)
Albumin: 3.9 g/dL (ref 3.6–5.1)
Alkaline phosphatase (APISO): 118 U/L (ref 37–153)
BUN: 15 mg/dL (ref 7–25)
CO2: 26 mmol/L (ref 20–32)
Calcium: 9.4 mg/dL (ref 8.6–10.4)
Chloride: 102 mmol/L (ref 98–110)
Creat: 0.87 mg/dL (ref 0.60–1.00)
Globulin: 2.5 g/dL (calc) (ref 1.9–3.7)
Glucose, Bld: 80 mg/dL (ref 65–99)
Potassium: 4.6 mmol/L (ref 3.5–5.3)
Sodium: 137 mmol/L (ref 135–146)
Total Bilirubin: 0.4 mg/dL (ref 0.2–1.2)
Total Protein: 6.4 g/dL (ref 6.1–8.1)
eGFR: 70 mL/min/{1.73_m2} (ref >=60)

## 2021-07-21 LAB — CBC
HCT: 34.6 % — ABNORMAL LOW (ref 35.0–45.0)
Hemoglobin: 11.1 g/dL — ABNORMAL LOW (ref 11.7–15.5)
MCH: 26.9 pg — ABNORMAL LOW (ref 27.0–33.0)
MCHC: 32.1 g/dL (ref 32.0–36.0)
MCV: 83.8 fL (ref 80.0–100.0)
MPV: 9.3 fL (ref 7.5–12.5)
Platelets: 308 10*3/uL (ref 140–400)
RBC: 4.13 10*6/uL (ref 3.80–5.10)
RDW: 13.8 % (ref 11.0–15.0)
WBC: 6.3 10*3/uL (ref 3.8–10.8)

## 2021-07-21 LAB — IRON,TIBC AND FERRITIN PANEL
%SAT: 18 % (calc) (ref 16–45)
Ferritin: 239 ng/mL (ref 16–288)
Iron: 47 ug/dL (ref 45–160)
TIBC: 265 mcg/dL (calc) (ref 250–450)

## 2021-07-21 LAB — LIPID PANEL
Cholesterol: 129 mg/dL (ref <200)
HDL: 61 mg/dL (ref >=50)
LDL Cholesterol (Calc): 52 mg/dL (calc)
Non-HDL Cholesterol (Calc): 68 mg/dL (calc) (ref <130)
Total CHOL/HDL Ratio: 2.1 (calc) (ref <5.0)
Triglycerides: 81 mg/dL (ref <150)

## 2021-07-21 LAB — B12 AND FOLATE PANEL
Folate: >24 ng/mL
Vitamin B-12: 667 pg/mL (ref 200–1100)

## 2021-07-21 LAB — TSH: TSH: 2.64 mIU/L (ref 0.40–4.50)

## 2021-08-15 ENCOUNTER — Other Ambulatory Visit: Payer: Self-pay | Admitting: Internal Medicine

## 2021-08-15 DIAGNOSIS — Z1231 Encounter for screening mammogram for malignant neoplasm of breast: Secondary | ICD-10-CM

## 2021-08-28 ENCOUNTER — Telehealth: Payer: Self-pay

## 2021-08-28 NOTE — Telephone Encounter (Signed)
? ?  Pre-operative Risk Assessment  ?  ?Patient Name: MARNI FRANZONI  ?DOB: [birth year] ?MRN: [id number]  ? ?Request for Surgical Clearance   ? ?Procedure:  CROWN ON TOOTH #5-MAY HAVE MILD BLEEDING ? ?Date of Surgery:  Clearance TBD                              ?   ?Surgeon:  HA&ENRICO SILVA ?Surgeon's Group or Practice Name:  [facility] ?Phone number:  [phone] ?Fax number:  [phone] ?  ?Type of Clearance Requested:   ?- Medical  ?- Pharmacy:  Hold Clopidogrel (Plavix)   ?  ?Type of Anesthesia:  Local  Lidocaine  ?  ?Additional requests/questions:   ? ? ? ?

## 2021-08-29 NOTE — Telephone Encounter (Signed)
Pt agreeable to plan of care for pre op clearance. Pt has been scheduled to see Dr. Gwenlyn Found 09/05/21 @ 3:15 at [facility]. Pt thanked me for the call. I will fax FYI to requesting office pt has appt 09/05/21. Pt states she will call and move the dental appt out as it was set for 09/04/21. I stated that we did not have that there was a scheduled yet. Pt said that was ok and thanked me for the call and the help.  ?

## 2021-08-29 NOTE — Telephone Encounter (Signed)
? ?  Name: Alicia Parker  ?DOB: [birth year]  ?MRN: [id number] ? ?Primary Cardiologist: Quay Burow, MD ? ?Chart reviewed as part of pre-operative protocol coverage. Patient has not been seen since 12/2019 and this was a virtual visit. Therefore, she will require a follow-up visit in our office in order to better assess preoperative cardiovascular risk. ? ?Pre-op covering staff: ?- Please schedule appointment and call patient to inform them. If patient already had an upcoming appointment within acceptable timeframe, please add "pre-op clearance" to the appointment notes so provider is aware. ?- Please contact requesting surgeon's office via preferred method (i.e, phone, fax) to inform them of need for appointment prior to surgery. ? ?Patient is on Plavix for PAD. She should not have to hold this for a simple dental procedure. ? ?Darreld Mclean, PA-C  ?08/29/2021, 8:58 AM  ? ?

## 2021-09-05 ENCOUNTER — Other Ambulatory Visit: Payer: Self-pay

## 2021-09-05 ENCOUNTER — Ambulatory Visit: Payer: Medicare HMO | Admitting: Cardiovascular Disease

## 2021-09-05 ENCOUNTER — Encounter: Payer: Self-pay | Admitting: Cardiovascular Disease

## 2021-09-05 VITALS — BP 119/62 | HR 63 | Ht 59.0 in | Wt 211.4 lb

## 2021-09-05 DIAGNOSIS — E7800 Pure hypercholesterolemia, unspecified: Secondary | ICD-10-CM

## 2021-09-05 DIAGNOSIS — I10 Essential (primary) hypertension: Secondary | ICD-10-CM | POA: Diagnosis not present

## 2021-09-05 DIAGNOSIS — I739 Peripheral vascular disease, unspecified: Secondary | ICD-10-CM

## 2021-09-05 DIAGNOSIS — G4733 Obstructive sleep apnea (adult) (pediatric): Secondary | ICD-10-CM

## 2021-09-05 DIAGNOSIS — E669 Obesity, unspecified: Secondary | ICD-10-CM | POA: Diagnosis not present

## 2021-09-05 DIAGNOSIS — Z9989 Dependence on other enabling machines and devices: Secondary | ICD-10-CM

## 2021-09-05 NOTE — Progress Notes (Signed)
? ? ? ?09/05/2021 ?Alicia Parker   ?04-Jun-1946  ?[id number] ? ?Primary Physician Alicia Ebbs, MD ?Primary Cardiologist: Alicia Harp MD Alicia Parker, [place] ? ?HPI:  Alicia Parker is a 76 y.o.  moderately overweight married African-American female mother of 3 children, grandmother of 38 grandchildren referred by Alicia Parker for evaluation treatment of symptomatic PAD.  I last spoke to her on the telephone for a virtual telemedicine phone visit 06/01/2019.Marland Kitchen She is retired from working at [employer] in [employer].  Risk factors include 30 to 50 pack years of tobacco abuse having recently quit 11/18/2018.  She does have treated hypertension on low-dose amlodipine.  She also has obstructive sleep apnea on CPAP which is recently started.  There is no family history of heart disease.  She is never had a heart attack or stroke.  She denies chest pain or shortness of breath.  She has noticed bilateral lower extremity claudication which is fairly symmetric over the last year with recent lower extremity Dopplers performed 04/14/2019 revealing a right ABI 1.92 and a left ABI of 0.54 with a high-frequency signal in her left SFA. ?  ?I performed peripheral angiography on her 05/16/2019 revealing mid left SFA disease which I intervened on using a [place] drug-eluting stent with an excellent result.  She was discharged home the following day.  Her follow-up Dopplers performed 05/30/2019 revealed an increase in her left ABI from 0.54-0.8 with excellent velocity Doppler signals.  Her claudication has significantly improved. ? ?Since I spoke to her over 2 years ago she has done well.  She has gained some weight.  She denies chest pain shortness of breath or claudication.  Her most recent lower extremity arterial Doppler studies performed 11/29/2020 revealed a right ABI of 0.93, left of 0.90 with a patent left SFA stent. ? ? ?Current Meds  ?Medication Sig  ? acetic acid-hydrocortisone (VOSOL-HC) OTIC solution Place 3  drops into both ears 2 (two) times daily as needed.  ? amLODipine (NORVASC) 2.5 MG tablet TAKE 1 TABLET EVERY DAY  ? aspirin EC 81 MG tablet Take 81 mg by mouth daily.  ? atorvastatin (LIPITOR) 20 MG tablet Take 1 tablet (20 mg total) by mouth daily.  ? bismuth subsalicylate (PEPTO BISMOL) 262 MG/15ML suspension Take 30 mLs by mouth every 6 (six) hours as needed for indigestion.  ? clopidogrel (PLAVIX) 75 MG tablet TAKE 1 TABLET EVERY DAY  ? diclofenac Sodium (VOLTAREN) 1 % GEL Apply 2 g topically 2 (two) times daily as needed.  ? famotidine (PEPCID) 20 MG tablet TAKE 1 TABLET TWICE DAILY  ? loratadine (CLARITIN) 10 MG tablet Take 10 mg by mouth at bedtime.  ? methocarbamol (ROBAXIN) 500 MG tablet Take 1 tablet (500 mg total) by mouth every 6 (six) hours as needed for muscle spasms.  ? sertraline (ZOLOFT) 50 MG tablet TAKE 1 AND 1/2 TABLETS DAILY BY MOUTH (Patient taking differently: Take 100 mg by mouth daily.)  ?  ? ?No Known Allergies ? ?Social History  ? ?Socioeconomic History  ? Marital status: Married  ?  Spouse name: Not on file  ? Number of children: Not on file  ? Years of education: Not on file  ? Highest education level: Not on file  ?Occupational History  ? Not on file  ?Tobacco Use  ? Smoking status: Former  ?  Packs/day: 1.00  ?  Years: 10.00  ?  Pack years: 10.00  ?  Types: Cigarettes  ?  Quit  date: 11/2018  ?  Years since quitting: 2.8  ? Smokeless tobacco: Never  ?Vaping Use  ? Vaping Use: Never used  ?Substance and Sexual Activity  ? Alcohol use: Yes  ?  Alcohol/week: 6.0 standard drinks  ?  Types: 6 Cans of beer per week  ?  Comment: daily-   ? Drug use: No  ? Sexual activity: Yes  ?  Partners: Male  ?  Birth control/protection: Surgical  ?  Comment: btl  ?Other Topics Concern  ? Not on file  ?Social History Narrative  ? Not on file  ? ?Social Determinants of Health  ? ?Financial Resource Strain: Not on file  ?Food Insecurity: Not on file  ?Transportation Needs: Not on file  ?Physical Activity:  Not on file  ?Stress: Not on file  ?Social Connections: Not on file  ?Intimate Partner Violence: Not on file  ?  ? ?Review of Systems: ?General: negative for chills, fever, night sweats or weight changes.  ?Cardiovascular: negative for chest pain, dyspnea on exertion, edema, orthopnea, palpitations, paroxysmal nocturnal dyspnea or shortness of breath ?Dermatological: negative for rash ?Respiratory: negative for cough or wheezing ?Urologic: negative for hematuria ?Abdominal: negative for nausea, vomiting, diarrhea, bright red blood per rectum, melena, or hematemesis ?Neurologic: negative for visual changes, syncope, or dizziness ?All other systems reviewed and are otherwise negative except as noted above. ? ? ? ?Blood pressure 119/62, pulse 63, height '4\' 11"'$  (1.499 m), weight 211 lb 6.4 oz (95.9 kg), last menstrual period 06/16/1997, SpO2 98 %.  ?General appearance: alert and no distress ?Neck: no adenopathy, no carotid bruit, no JVD, supple, symmetrical, trachea midline, and thyroid not enlarged, symmetric, no tenderness/mass/nodules ?Lungs: clear to auscultation bilaterally ?Heart: regular rate and rhythm, S1, S2 normal, no murmur, click, rub or gallop ?Extremities: extremities normal, atraumatic, no cyanosis or edema ?Pulses: 2+ and symmetric ?Skin: Skin color, texture, turgor normal. No rashes or lesions ?Neurologic: Grossly normal ? ?EKG sinus rhythm at 63 without ST or T wave changes.  I personally reviewed this EKG. ? ?ASSESSMENT AND PLAN:  ? ?Elevated LDL cholesterol level ?History of hyperlipidemia on statin therapy with lipid profile performed 07/18/2021 revealing total cholesterol 129, LDL of 52 and HDL 61. ? ?Obesity (BMI 30-39.9) ?History of morbid obesity with a BMI of 42.  I am going to refer her to the [facility] for assisted weight loss ? ?Peripheral arterial disease ([place]) ?History of peripheral arterial disease status post peripheral angiography, PTA and stenting of her left SFA by  myself 05/16/2019 with an [place] drug-eluting stent.  Her Dopplers improved within the left ABI that went from 0.54 up to 0.8 with resolution of her claudication.  We will recheck lower extremity arterial Doppler studies. ? ?Essential hypertension ?History of essential hypertension blood pressure measured today at 119/62.  She is on amlodipine. ? ?OSA on CPAP ?History of obstructive sleep apnea on CPAP ? ? ? ? ?Alicia Harp MD FACP,FACC,FAHA, FSCAI ?09/05/2021 ?4:08 PM ?

## 2021-09-05 NOTE — Assessment & Plan Note (Signed)
History of obstructive sleep apnea on CPAP. 

## 2021-09-05 NOTE — Patient Instructions (Signed)
Medication Instructions:  ?Your physician recommends that you continue on your current medications as directed. Please refer to the Current Medication list given to you today. ? ?*If you need a refill on your cardiac medications before your next appointment, please call your pharmacy* ? ? ?Testing/Procedures: ?Your physician has requested that you have a lower extremity arterial duplex. This test is an ultrasound of the arteries in the legs. It looks at arterial blood flow in the legs. Allow one hour for Lower Arterial scans. There are no restrictions or special instructions ? ?Your physician has requested that you have an ankle brachial index (ABI). During this test an ultrasound and blood pressure cuff are used to evaluate the arteries that supply the arms and legs with blood. Allow thirty minutes for this exam. There are no restrictions or special instructions. ?To be done in June. These procedures will be done at [facility] ? ? ?Follow-Up: ?At [facility], you and your health needs are our priority.  As part of our continuing mission to provide you with exceptional heart care, we have created designated Provider Care Teams.  These Care Teams include your primary Cardiologist (physician) and Advanced Practice Providers (APPs -  Physician Assistants and Nurse Practitioners) who all work together to provide you with the care you need, when you need it. ? ?We recommend signing up for the patient portal called "MyChart".  Sign up information is provided on this After Visit Summary.  MyChart is used to connect with patients for Virtual Visits (Telemedicine).  Patients are able to view lab/test results, encounter notes, upcoming appointments, etc.  Non-urgent messages can be sent to your provider as well.   ?To learn more about what you can do with MyChart, go to [email or web address].   ? ?Your next appointment:   ?12 month(s) ? ?The format for your next appointment:   ?In Person ? ?Provider:    ?Quay Burow, MD ?

## 2021-09-05 NOTE — Assessment & Plan Note (Signed)
History of morbid obesity with a BMI of 42.  I am going to refer her to the [facility] for assisted weight loss ?

## 2021-09-05 NOTE — Assessment & Plan Note (Signed)
History of essential hypertension blood pressure measured today at 119/62.  She is on amlodipine. ?

## 2021-09-05 NOTE — Assessment & Plan Note (Signed)
History of peripheral arterial disease status post peripheral angiography, PTA and stenting of her left SFA by myself 05/16/2019 with an [place] drug-eluting stent.  Her Dopplers improved within the left ABI that went from 0.54 up to 0.8 with resolution of her claudication.  We will recheck lower extremity arterial Doppler studies. ?

## 2021-09-05 NOTE — Assessment & Plan Note (Signed)
History of hyperlipidemia on statin therapy with lipid profile performed 07/18/2021 revealing total cholesterol 129, LDL of 52 and HDL 61. ?

## 2021-09-06 ENCOUNTER — Encounter: Payer: Self-pay | Admitting: Cardiovascular Disease

## 2021-09-06 NOTE — Telephone Encounter (Signed)
error 

## 2021-09-15 ENCOUNTER — Encounter: Payer: Self-pay | Admitting: Registered Nurse

## 2021-09-27 ENCOUNTER — Encounter: Payer: Self-pay | Admitting: Emergency Medicine

## 2021-09-27 ENCOUNTER — Ambulatory Visit
Admission: EM | Admit: 2021-09-27 | Discharge: 2021-09-27 | Disposition: A | Discharge status: Home / Self Care | Payer: Medicare HMO | Attending: Family Medicine | Admitting: Family Medicine

## 2021-09-27 DIAGNOSIS — B372 Candidiasis of skin and nail: Secondary | ICD-10-CM | POA: Diagnosis not present

## 2021-09-27 MED ORDER — KETOCONAZOLE 2 % EX CREA: 1.0000 "application " | TOPICAL_CREAM | Freq: Every day | CUTANEOUS | 0 refills | Status: AC

## 2021-09-27 NOTE — Discharge Instructions (Addendum)
Put ketoconazole cream on the rash areas once daily. ? ?Try to keep those areas dry. If perpsiration collects there, pat it dry.  ? ?Don't use any deodorants for the next week or so, until the rash is improved a good bit. ?

## 2021-09-27 NOTE — ED Triage Notes (Signed)
Patient states that she started using a new deodorant and feels that it has broken her out under her arms.  It's more of a "whelp" under her arms.  ?

## 2021-09-27 NOTE — ED Provider Notes (Signed)
?[facility] ? ? ? ?CSN: [id number] ?Arrival date & time: 09/27/21  1320 ? ? ?  ? ?History   ?Chief Complaint ?Chief Complaint  ?Patient presents with  ? Rash  ? ? ?HPI ?Alicia Parker is a 76 y.o. female.  ? ? ?Rash ?Here for some irritation and itching under both underarms in the last couple of days.  No fever or chills.  She feels it may be due to having used a new deodorant ? ?Past Medical History:  ?Diagnosis Date  ? Anxiety   ? Cataract   ? Clotting disorder ([place])   ? Essential hypertension, benign   ? GERD (gastroesophageal reflux disease)   ? Hx of colonic polyps 2017  ? Hyperlipidemia   ? no current medications  ? PAD (peripheral artery disease) ([place]) 2020  ? Sleep apnea   ? doesnt wear cpap   ? ? ?Patient Active Problem List  ? Diagnosis Date Noted  ? Bilateral hearing loss 08/06/2020  ? OSA on CPAP 05/09/2019  ? Generalized anxiety disorder 05/09/2019  ? Peripheral arterial disease ([place]) 04/26/2019  ? Essential hypertension 04/26/2019  ? Elevated LDL cholesterol level 10/18/2011  ? Lumbar spondylosis 10/18/2011  ? Obesity (BMI 30-39.9) 10/18/2011  ? Hx of gastroesophageal reflux (GERD) 10/18/2011  ? Bowens disease 10/18/2011  ? History of colon polyps 10/18/2011  ? ? ?Past Surgical History:  ?Procedure Laterality Date  ? ABDOMINAL AORTOGRAM W/LOWER EXTREMITY N/A 05/16/2019  ? Procedure: ABDOMINAL AORTOGRAM W/LOWER EXTREMITY;  Surgeon: Lorretta Harp, MD;  Location: [facility];  Service: Cardiovascular;  Laterality: N/A;  ? APPENDECTOMY  1968  ? CHOLECYSTECTOMY  1968  ? COLONOSCOPY  2004  ? polyps  ? PERIPHERAL VASCULAR INTERVENTION  05/16/2019  ? Procedure: PERIPHERAL VASCULAR INTERVENTION;  Surgeon: Lorretta Harp, MD;  Location: [facility];  Service: Cardiovascular;;  ? REDUCTION MAMMAPLASTY Bilateral   ? TOE SURGERY    ? TUBAL LIGATION  1968  ? UPPER GASTROINTESTINAL ENDOSCOPY    ? ? ?OB History   ? ? Gravida  ?3  ? Para  ?3  ? Term  ?3  ? Preterm  ?   ? AB  ?   ?  Living  ?3  ?  ? ? SAB  ?   ? IAB  ?   ? Ectopic  ?   ? Multiple  ?   ? Live Births  ?3  ?   ?  ?  ? ? ? ?Home Medications   ? ?Prior to Admission medications   ?Medication Sig Start Date End Date Taking? Authorizing Provider  ?acetic acid-hydrocortisone (VOSOL-HC) OTIC solution Place 3 drops into both ears 2 (two) times daily as needed. 08/06/20  Yes Maximiano Coss, NP  ?amLODipine (NORVASC) 2.5 MG tablet TAKE 1 TABLET EVERY DAY 08/22/20  Yes Maximiano Coss, NP  ?aspirin EC 81 MG tablet Take 81 mg by mouth daily.   Yes [provider]  ?atorvastatin (LIPITOR) 20 MG tablet Take 1 tablet (20 mg total) by mouth daily. 07/23/20  Yes Maximiano Coss, NP  ?bismuth subsalicylate (PEPTO BISMOL) 262 MG/15ML suspension Take 30 mLs by mouth every 6 (six) hours as needed for indigestion.   Yes [provider]  ?clopidogrel (PLAVIX) 75 MG tablet TAKE 1 TABLET EVERY DAY 12/28/20  Yes Lorretta Harp, MD  ?diclofenac Sodium (VOLTAREN) 1 % GEL Apply 2 g topically 2 (two) times daily as needed. 08/06/20  Yes Maximiano Coss, NP  ?famotidine (  PEPCID) 20 MG tablet TAKE 1 TABLET TWICE DAILY 11/26/20  Yes Maximiano Coss, NP  ?ketoconazole (NIZORAL) 2 % cream Apply 1 application. topically daily. To the affected area till better 09/27/21  Yes Shakirra Buehler, Gwenlyn Perking, MD  ?loratadine (CLARITIN) 10 MG tablet Take 10 mg by mouth at bedtime.   Yes [provider]  ?methocarbamol (ROBAXIN) 500 MG tablet Take 1 tablet (500 mg total) by mouth every 6 (six) hours as needed for muscle spasms. 09/13/19  Yes Jacelyn Pi, Lilia Argue, MD  ?sertraline (ZOLOFT) 50 MG tablet TAKE 1 AND 1/2 TABLETS DAILY BY MOUTH ?Patient taking differently: Take 100 mg by mouth daily. 01/30/20  Yes Jacelyn Pi, Lilia Argue, MD  ?Omega-3 Fatty Acids (OMEGA 3 PO) Take 2 capsules by mouth 3 (three) times daily with meals.    [provider]  ? ? ?Family History ?Family History  ?Problem Relation Age of Onset  ? Hypertension Mother   ? Stroke Mother   ?  Hypertension Father   ? Leukemia Father 102  ? Early death Brother   ?     pneumonia  ? Hypertension Maternal Grandmother   ? Early death Brother   ? Colon polyps Sister   ? Colon cancer Neg Hx   ? Esophageal cancer Neg Hx   ? Rectal cancer Neg Hx   ? Stomach cancer Neg Hx   ? ? ?Social History ?Social History  ? ?Tobacco Use  ? Smoking status: Former  ?  Packs/day: 1.00  ?  Years: 10.00  ?  Pack years: 10.00  ?  Types: Cigarettes  ?  Quit date: 11/2018  ?  Years since quitting: 2.8  ? Smokeless tobacco: Never  ?Vaping Use  ? Vaping Use: Never used  ?Substance Use Topics  ? Alcohol use: Yes  ?  Alcohol/week: 6.0 standard drinks  ?  Types: 6 Cans of beer per week  ?  Comment: daily-   ? Drug use: No  ? ? ? ?Allergies   ?Patient has no known allergies. ? ? ?Review of Systems ?Review of Systems  ?Skin:  Positive for rash.  ? ? ?Physical Exam ?Triage Vital Signs ?ED Triage Vitals [09/27/21 1340]  ?Enc Vitals Group  ?   BP (!) 143/68  ?   Pulse Rate (!) 58  ?   Resp 18  ?   Temp 98 ?F (36.7 ?C)  ?   Temp Source Oral  ?   SpO2 96 %  ?   Weight 211 lb 6.7 oz (95.9 kg)  ?   Height '4\' 11"'$  (1.499 m)  ?   Head Circumference   ?   Peak Flow   ?   Pain Score 3  ?   Pain Loc   ?   Pain Edu?   ?   Excl. in [place]?   ? ?No data found. ? ?Updated Vital Signs ?BP (!) 143/68 (BP Location: Left Arm)   Pulse (!) 58   Temp 98 ?F (36.7 ?C) (Oral)   Resp 18   Ht '4\' 11"'$  (1.499 m)   Wt 95.9 kg   LMP 06/16/1997   SpO2 96%   BMI 42.70 kg/m?  ? ?Visual Acuity ?Right Eye Distance:   ?Left Eye Distance:   ?Bilateral Distance:   ? ?Right Eye Near:   ?Left Eye Near:    ?Bilateral Near:    ? ?Physical Exam ?Vitals reviewed.  ?Constitutional:   ?   General: She is not in acute distress. ?  Appearance: She is not toxic-appearing.  ?Skin: ?   Coloration: Skin is not pale.  ?   Comments: There is confluent erythema without induration in the left axilla.  It extends about 3 cm in the horizontal direction and half a centimeter in the vertical  direction.  No tenderness and no discharge or ulceration.  ?Neurological:  ?   Mental Status: She is oriented to person, place, and time.  ?Psychiatric:     ?   Behavior: Behavior normal.  ? ? ? ?UC Treatments / Results  ?Labs ?(all labs ordered are listed, but only abnormal results are displayed) ?Labs Reviewed - No data to display ? ?EKG ? ? ?Radiology ?No results found. ? ?Procedures ?Procedures (including critical care time) ? ?Medications Ordered in UC ?Medications - No data to display ? ?Initial Impression / Assessment and Plan / UC Course  ?I have reviewed the triage vital signs and the nursing notes. ? ?Pertinent labs & imaging results that were available during my care of the patient were reviewed by me and considered in my medical decision making (see chart for details). ? ?  ? ?We will treat the intertrigo with ketoconazole cream ?Final Clinical Impressions(s) / UC Diagnoses  ? ?Final diagnoses:  ?Candidal intertrigo  ? ? ? ?Discharge Instructions   ? ?  ?Put ketoconazole cream on the rash areas once daily. ? ?Try to keep those areas dry. If perpsiration collects there, pat it dry.  ? ?Don't use any deodorants for the next week or so, until the rash is improved a good bit. ? ? ? ? ?ED Prescriptions   ? ? Medication Sig Dispense Auth. Provider  ? ketoconazole (NIZORAL) 2 % cream Apply 1 application. topically daily. To the affected area till better 30 g Windy Carina Gwenlyn Perking, MD  ? ?  ? ?PDMP not reviewed this encounter. ?  ?Barrett Henle, MD ?09/27/21 1358 ? ?

## 2021-09-30 ENCOUNTER — Ambulatory Visit
Admission: RE | Admit: 2021-09-30 | Discharge: 2021-09-30 | Disposition: A | Discharge status: Home / Self Care | Payer: Medicare HMO | Source: Ambulatory Visit | Attending: Internal Medicine | Admitting: Internal Medicine

## 2021-09-30 DIAGNOSIS — Z1231 Encounter for screening mammogram for malignant neoplasm of breast: Secondary | ICD-10-CM

## 2021-10-01 ENCOUNTER — Ambulatory Visit (HOSPITAL_COMMUNITY)
Admission: RE | Admit: 2021-10-01 | Discharge: 2021-10-01 | Disposition: A | Discharge status: Home / Self Care | Payer: Medicare HMO | Source: Ambulatory Visit | Attending: Cardiology | Admitting: Cardiology

## 2021-10-01 ENCOUNTER — Encounter (HOSPITAL_COMMUNITY): Payer: Medicare HMO

## 2021-10-01 DIAGNOSIS — Z95820 Peripheral vascular angioplasty status with implants and grafts: Secondary | ICD-10-CM | POA: Diagnosis present

## 2021-11-21 DIAGNOSIS — R7303 Prediabetes: Secondary | ICD-10-CM | POA: Diagnosis not present

## 2021-11-21 DIAGNOSIS — E7849 Other hyperlipidemia: Secondary | ICD-10-CM | POA: Diagnosis not present

## 2021-11-21 DIAGNOSIS — F411 Generalized anxiety disorder: Secondary | ICD-10-CM | POA: Diagnosis not present

## 2021-11-21 DIAGNOSIS — I10 Essential (primary) hypertension: Secondary | ICD-10-CM | POA: Diagnosis not present

## 2021-12-28 ENCOUNTER — Other Ambulatory Visit: Payer: Self-pay | Admitting: Cardiovascular Disease

## 2021-12-31 DIAGNOSIS — R7303 Prediabetes: Secondary | ICD-10-CM | POA: Diagnosis not present

## 2021-12-31 DIAGNOSIS — E7849 Other hyperlipidemia: Secondary | ICD-10-CM | POA: Diagnosis not present

## 2021-12-31 DIAGNOSIS — I10 Essential (primary) hypertension: Secondary | ICD-10-CM | POA: Diagnosis not present

## 2021-12-31 DIAGNOSIS — Z6841 Body Mass Index (BMI) 40.0 and over, adult: Secondary | ICD-10-CM | POA: Diagnosis not present

## 2021-12-31 DIAGNOSIS — F411 Generalized anxiety disorder: Secondary | ICD-10-CM | POA: Diagnosis not present

## 2021-12-31 DIAGNOSIS — K219 Gastro-esophageal reflux disease without esophagitis: Secondary | ICD-10-CM | POA: Diagnosis not present

## 2022-01-01 DIAGNOSIS — H2513 Age-related nuclear cataract, bilateral: Secondary | ICD-10-CM | POA: Diagnosis not present

## 2022-01-23 ENCOUNTER — Telehealth: Payer: Self-pay | Admitting: Family Medicine

## 2022-01-23 NOTE — Telephone Encounter (Signed)
LVM and mychart msg informing pt of need to reschedule 9/21 appointment - Amy out

## 2022-02-18 DIAGNOSIS — H2512 Age-related nuclear cataract, left eye: Secondary | ICD-10-CM | POA: Diagnosis not present

## 2022-02-26 DIAGNOSIS — Z23 Encounter for immunization: Secondary | ICD-10-CM | POA: Diagnosis not present

## 2022-02-26 DIAGNOSIS — I10 Essential (primary) hypertension: Secondary | ICD-10-CM | POA: Diagnosis not present

## 2022-02-26 DIAGNOSIS — F411 Generalized anxiety disorder: Secondary | ICD-10-CM | POA: Diagnosis not present

## 2022-02-26 DIAGNOSIS — R7303 Prediabetes: Secondary | ICD-10-CM | POA: Diagnosis not present

## 2022-02-26 DIAGNOSIS — M65332 Trigger finger, left middle finger: Secondary | ICD-10-CM | POA: Diagnosis not present

## 2022-03-06 ENCOUNTER — Ambulatory Visit: Payer: Medicare HMO | Admitting: Family Medicine

## 2022-03-18 ENCOUNTER — Ambulatory Visit: Payer: Medicare HMO | Admitting: Family Medicine

## 2022-03-18 DIAGNOSIS — H25812 Combined forms of age-related cataract, left eye: Secondary | ICD-10-CM | POA: Diagnosis not present

## 2022-03-18 DIAGNOSIS — H2513 Age-related nuclear cataract, bilateral: Secondary | ICD-10-CM | POA: Diagnosis not present

## 2022-04-01 DIAGNOSIS — H25811 Combined forms of age-related cataract, right eye: Secondary | ICD-10-CM | POA: Diagnosis not present

## 2022-04-01 DIAGNOSIS — H2513 Age-related nuclear cataract, bilateral: Secondary | ICD-10-CM | POA: Diagnosis not present

## 2022-05-02 DIAGNOSIS — H2513 Age-related nuclear cataract, bilateral: Secondary | ICD-10-CM | POA: Diagnosis not present

## 2022-05-28 DIAGNOSIS — Z0100 Encounter for examination of eyes and vision without abnormal findings: Secondary | ICD-10-CM | POA: Diagnosis not present

## 2022-05-28 DIAGNOSIS — I10 Essential (primary) hypertension: Secondary | ICD-10-CM | POA: Diagnosis not present

## 2022-05-28 DIAGNOSIS — M65332 Trigger finger, left middle finger: Secondary | ICD-10-CM | POA: Diagnosis not present

## 2022-05-28 DIAGNOSIS — L255 Unspecified contact dermatitis due to plants, except food: Secondary | ICD-10-CM | POA: Diagnosis not present

## 2022-05-28 DIAGNOSIS — R7303 Prediabetes: Secondary | ICD-10-CM | POA: Diagnosis not present

## 2022-06-11 DIAGNOSIS — H2513 Age-related nuclear cataract, bilateral: Secondary | ICD-10-CM | POA: Diagnosis not present

## 2022-07-01 NOTE — Progress Notes (Signed)
PATIENT: Alicia Parker DOB: Jun 09, 1946  REASON FOR VISIT: follow up HISTORY FROM: patient  Chief Complaint  Patient presents with   Follow-up    Patient in room 1 here for follow up on Cpap. Doing well wearing cpap nightly.       HISTORY OF PRESENT ILLNESS:  07/02/22 ALL:  Alicia Parker returns for follow up for OSA on CPAP. She was last seen 02/2021 and felt that she was not getting enough air. We increased max pressure from 14 to 16cmH20. Since, she has done well. She is using CAP nightly for about 7-8 hours. She does continues to feel sleepy during the day. She admits that she needs to increase activity. She is planning to start an exercise program for weight management. She drnks 3-4 beers daily and wanting to cut back.     03/06/2021 ALL: Alicia Parker returns for follow up for OSA on CPAP. She was last seen 12/2019 and having difficulty an air leak, however, compliance had improved and AHI was well managed. Since, she has continued CPAP. She called yesterday to report "something was off with the air". She feels that she doesn't get enough air at times. She doesn't feel significantly different but does note that she does not rest as well if she does not use her CPAP.     12/22/2019 ALL:  Alicia Parker is a 77 y.o. female here today for follow up for OSA on CPAP. She is doing better with compliance. She continues to struggle with getting comfortable with machine. She has noted a leak in her mask. She is able to correct it when she tightens her headgear. She does feel better when using CPAP. She was unable to go to sleep one night this week after removing her mask. She fell back to sleep quickly once replacing it.   Compliance report dated 11/21/2019 through 12/20/2019 reveals that she used CPAP 30 of the past 30 days for compliance of 100%.  She used CPAP greater than 4 hours all 30 days.  Average usage was 6 hours and 40 minutes.  Visual AHI was 5.3 on 7 to 14 cm of water and an EPR of three.   There was a significant leak noted in the 95th percentile of 40.7.  HISTORY: (copied from my note on 06/23/2019)  Alicia Parker is a 77 y.o. female here today for follow up. Alicia Parker returns today for initial compliance review after being diagnosed with severe obstructive sleep apnea. She was started on CPAP about 2 months ago. She reports that she is doing well on CPAP. She does note improvement in energy. There are some mornings where she has not noticed much benefit with using CPAP. She is motivated to continue using CPAP. She is having no difficulties with the machine or supplies.   Compliance report dated 05/23/2019 through 06/21/2019 reveals that she use CPAP every night the last 30 days for compliance of 100%. She CPAP greater than 4 hours every night. Average usage was 6 hours and 17 minutes. Residual AHI was 4 on 7 to 14 cm of water and an EPR of 3. There was no significant leak.   HISTORY: (copied from Dr Guadelupe Sabin note on 03/03/2019)   Dear Dr. [name], I saw your patient, Alicia Parker, upon your kind request in my sleep clinic today for initial consultation of her sleep disorder, in particular, reevaluation of her prior diagnosis of obstructive sleep apnea.  The patient is unaccompanied today.  As you know, Ms.  Alicia Parker is a 77 year old right-handed woman with an underlying medical history of reflux disease, anxiety, hypertension, hyperlipidemia, cataracts and obesity, who was previously diagnosed with obstructive sleep apnea and placed on CPAP therapy.  Prior sleep study results are not available for my review today.  She has not been using her CPAP machine in some years.  She has had some weight gain over time.  I reviewed your office note from 02/18/2019.  Her Epworth sleepiness score is 17 out of 24, fatigue severity score is 54 out of 63.  She is married and lives with her husband and daughter.  She has 3 children.  She quit smoking in June 2020.  She drinks alcohol in the form of beer, about 2/day  but is cutting back, and caffeine in the form of coffee, about 1 or 1-1/2 cups/day on average.  She does not typically drink soda on a day-to-day basis or tea.  She has no pets, she has a TV in the bedroom and falls asleep with the TV on.  She is generally in bed before 9 and rise time varies, between 7 and 10.  She is retired from Scientist, research (medical) work.  She reports that her husband has PTSD and is a very restless sleeper and she is planning to separate their bedrooms as none of them are sleeping well.  She has a family history of sleep apnea and her brother who has a CPAP machine and her son.  She has 2 sons and 1 daughter.  She estimates that she has not been on CPAP therapy since 2004, she reports that her machine was from 2000.  She had difficulty with the full facemask.  She would be willing to get retested and consider CPAP therapy again.  She denies recurrent morning headaches but has nocturia about 2 or 3 times per average night and also allergy symptoms.    REVIEW OF SYSTEMS: Out of a complete 14 system review of symptoms, none the patient complains only of the following symptoms, daytime sleepiness and all other reviewed systems are negative.  ESS: 13/24, previously 5/24 in 02/2021  ALLERGIES: No Known Allergies  HOME MEDICATIONS: Outpatient Medications Prior to Visit  Medication Sig Dispense Refill   acetic acid-hydrocortisone (VOSOL-HC) OTIC solution Place 3 drops into both ears 2 (two) times daily as needed. 10 mL 2   amLODipine (NORVASC) 2.5 MG tablet TAKE 1 TABLET EVERY DAY 90 tablet 1   aspirin EC 81 MG tablet Take 81 mg by mouth daily.     atorvastatin (LIPITOR) 20 MG tablet Take 1 tablet (20 mg total) by mouth daily. 90 tablet 0   bismuth subsalicylate (PEPTO BISMOL) 262 MG/15ML suspension Take 30 mLs by mouth every 6 (six) hours as needed for indigestion.     clopidogrel (PLAVIX) 75 MG tablet TAKE 1 TABLET EVERY DAY 90 tablet 3   diclofenac Sodium (VOLTAREN) 1 % GEL Apply 2 g topically 2  (two) times daily as needed. 350 g 2   famotidine (PEPCID) 20 MG tablet TAKE 1 TABLET TWICE DAILY 180 tablet 1   ketoconazole (NIZORAL) 2 % cream Apply 1 application. topically daily. To the affected area till better 30 g 0   loratadine (CLARITIN) 10 MG tablet Take 10 mg by mouth at bedtime.     methocarbamol (ROBAXIN) 500 MG tablet Take 1 tablet (500 mg total) by mouth every 6 (six) hours as needed for muscle spasms. 30 tablet 3   sertraline (ZOLOFT) 50 MG tablet TAKE 1 AND 1/2  TABLETS DAILY BY MOUTH (Patient taking differently: Take 100 mg by mouth daily.) 135 tablet 1   Omega-3 Fatty Acids (OMEGA 3 PO) Take 2 capsules by mouth 3 (three) times daily with meals.     No facility-administered medications prior to visit.    PAST MEDICAL HISTORY: Past Medical History:  Diagnosis Date   Anxiety    Cataract    Clotting disorder ([place])    Essential hypertension, benign    GERD (gastroesophageal reflux disease)    Hx of colonic polyps 2017   Hyperlipidemia    no current medications   PAD (peripheral artery disease) ([place]) 2020   Sleep apnea    doesnt wear cpap     PAST SURGICAL HISTORY: Past Surgical History:  Procedure Laterality Date   ABDOMINAL AORTOGRAM W/LOWER EXTREMITY N/A 05/16/2019   Procedure: ABDOMINAL AORTOGRAM W/LOWER EXTREMITY;  Surgeon: Lorretta Harp, MD;  Location: [facility];  Service: Cardiovascular;  Laterality: N/A;   APPENDECTOMY  06/16/1966   CHOLECYSTECTOMY  06/16/1966   COLONOSCOPY  06/16/2002   polyps   EYE SURGERY Bilateral    oct 2023   PERIPHERAL VASCULAR INTERVENTION  05/16/2019   Procedure: PERIPHERAL VASCULAR INTERVENTION;  Surgeon: Lorretta Harp, MD;  Location: [facility];  Service: Cardiovascular;;   REDUCTION MAMMAPLASTY Bilateral    TOE SURGERY     TUBAL LIGATION  06/16/1966   UPPER GASTROINTESTINAL ENDOSCOPY      FAMILY HISTORY: Family History  Problem Relation Age of Onset   Hypertension Mother    Stroke Mother     Hypertension Father    Leukemia Father 38   Early death Brother        pneumonia   Hypertension Maternal Grandmother    Early death Brother    Colon polyps Sister    Colon cancer Neg Hx    Esophageal cancer Neg Hx    Rectal cancer Neg Hx    Stomach cancer Neg Hx     SOCIAL HISTORY: Social History   Socioeconomic History   Marital status: Married    Spouse name: Not on file   Number of children: Not on file   Years of education: Not on file   Highest education level: Not on file  Occupational History   Not on file  Tobacco Use   Smoking status: Former    Packs/day: 1.00    Years: 10.00    Total pack years: 10.00    Types: Cigarettes    Quit date: 11/2018    Years since quitting: 3.6   Smokeless tobacco: Never  Vaping Use   Vaping Use: Never used  Substance and Sexual Activity   Alcohol use: Yes    Alcohol/week: 6.0 standard drinks of alcohol    Types: 6 Cans of beer per week    Comment: daily-    Drug use: No   Sexual activity: Yes    Partners: Male    Birth control/protection: Surgical    Comment: btl  Other Topics Concern   Not on file  Social History Narrative   Not on file   Social Determinants of Health   Financial Resource Strain: Not on file  Food Insecurity: Not on file  Transportation Needs: Not on file  Physical Activity: Not on file  Stress: Not on file  Social Connections: Not on file  Intimate Partner Violence: Not on file      PHYSICAL EXAM  Vitals:   07/02/22 1252  BP: (!) 150/68  Pulse:  68  Weight: 215 lb 8 oz (97.8 kg)  Height: 5' (1.524 m)     Body mass index is 42.09 kg/m.  Generalized: Well developed, in no acute distress  Cardiology: normal rate and rhythm, no murmur noted Respiratory: clear to auscultation bilaterally  Neurological examination  Mentation: Alert oriented to time, place, history taking. Follows all commands speech and language fluent Cranial nerve II-XII: Pupils were equal round reactive to light.  Extraocular movements were full, visual field were full  Motor: The motor testing reveals 5 over 5 strength of all 4 extremities. Good symmetric motor tone is noted throughout.  Gait and station: Gait is normal.   DIAGNOSTIC DATA (LABS, IMAGING, TESTING) - I reviewed patient records, labs, notes, testing and imaging myself where available.      No data to display           Lab Results  Component Value Date   WBC 6.3 07/18/2021   HGB 11.1 (L) 07/18/2021   HCT 34.6 (L) 07/18/2021   MCV 83.8 07/18/2021   PLT 308 07/18/2021      Component Value Date/Time   NA 137 07/18/2021 0000   NA 138 08/06/2020 1011   K 4.6 07/18/2021 0000   CL 102 07/18/2021 0000   CO2 26 07/18/2021 0000   GLUCOSE 80 07/18/2021 0000   BUN 15 07/18/2021 0000   BUN 15 08/06/2020 1011   CREATININE 0.87 07/18/2021 0000   CALCIUM 9.4 07/18/2021 0000   PROT 6.4 07/18/2021 0000   PROT 6.5 08/06/2020 1011   ALBUMIN 4.1 08/06/2020 1011   AST 15 07/18/2021 0000   ALT 14 07/18/2021 0000   ALKPHOS 138 (H) 08/06/2020 1011   BILITOT 0.4 07/18/2021 0000   BILITOT 0.5 08/06/2020 1011   GFRNONAA 62 08/06/2020 1011   GFRNONAA 52 (L) 04/25/2015 1706   GFRAA 71 08/06/2020 1011   GFRAA 60 04/25/2015 1706   Lab Results  Component Value Date   CHOL 129 07/18/2021   HDL 61 07/18/2021   LDLCALC 52 07/18/2021   TRIG 81 07/18/2021   CHOLHDL 2.1 07/18/2021   Lab Results  Component Value Date   HGBA1C 6.1 (H) 08/06/2020   Lab Results  Component Value Date   ASNKNLZJ67 341 07/18/2021   Lab Results  Component Value Date   TSH 2.64 07/18/2021       ASSESSMENT AND PLAN 77 y.o. year old female  has a past medical history of Anxiety, Cataract, Clotting disorder ([place]), Essential hypertension, benign, GERD (gastroesophageal reflux disease), colonic polyps (2017), Hyperlipidemia, PAD (peripheral artery disease) ([place]) (2020), and Sleep apnea. here with     ICD-10-CM   1. OSA on CPAP  G47.33 For home use only DME  continuous positive airway pressure (CPAP)        Alicia Parker is doing well on CPAP therapy. Compliance report shows excellent compliance. I have encouraged her to continue nightly use and use greater than 4 hours each night. We have discussed elevated ESS scores. I have encouraged her to discuss weight management plan with PCP. Consider reducing alcohol intake. Healthy lifestyle habits encouraged. She will follow up with me in 1 year, sooner if needed. She verbalizes understanding and agreement with this plan.    Orders Placed This Encounter  Procedures   For home use only DME continuous positive airway pressure (CPAP)    Supplies    Order Specific Question:   Length of Need    Answer:   Lifetime    Order Specific Question:  Patient has OSA or probable OSA    Answer:   Yes    Order Specific Question:   Is the patient currently using CPAP in the home    Answer:   Yes    Order Specific Question:   Settings    Answer:   Other see comments    Order Specific Question:   CPAP supplies needed    Answer:   Mask, headgear, cushions, filters, heated tubing and water chamber      No orders of the defined types were placed in this encounter.      Alicia Presto, FNP-C 07/02/2022, 1:11 PM [facility] [address], [place], [place] [address] [phone]

## 2022-07-01 NOTE — Patient Instructions (Addendum)
Please continue using your CPAP regularly. While your insurance requires that you use CPAP at least 4 hours each night on 70% of the nights, I recommend, that you not skip any nights and use it throughout the night if you can. Getting used to CPAP and staying with the treatment long term does take time and patience and discipline. Untreated obstructive sleep apnea when it is moderate to severe can have an adverse impact on cardiovascular health and raise her risk for heart disease, arrhythmias, hypertension, congestive heart failure, stroke and diabetes. Untreated obstructive sleep apnea causes sleep disruption, nonrestorative sleep, and sleep deprivation. This can have an impact on your day to day functioning and cause daytime sleepiness and impairment of cognitive function, memory loss, mood disturbance, and problems focussing. Using CPAP regularly can improve these symptoms.   Continue focusing on weight management with well balanced diet and regular physical exercise. Consider reducing alcohol intake. Continue close follow up with primary care.    Follow up in 1 year

## 2022-07-02 ENCOUNTER — Ambulatory Visit: Payer: Medicare HMO | Admitting: Family Medicine

## 2022-07-02 ENCOUNTER — Encounter: Payer: Self-pay | Admitting: Family Medicine

## 2022-07-02 VITALS — BP 150/68 | HR 68 | Ht 60.0 in | Wt 215.5 lb

## 2022-07-02 DIAGNOSIS — G4733 Obstructive sleep apnea (adult) (pediatric): Secondary | ICD-10-CM

## 2022-08-18 ENCOUNTER — Other Ambulatory Visit: Payer: Self-pay | Admitting: Internal Medicine

## 2022-08-18 DIAGNOSIS — Z1231 Encounter for screening mammogram for malignant neoplasm of breast: Secondary | ICD-10-CM

## 2022-10-02 ENCOUNTER — Ambulatory Visit
Admission: RE | Admit: 2022-10-02 | Discharge: 2022-10-02 | Disposition: A | Discharge status: Home / Self Care | Payer: Medicare HMO | Source: Ambulatory Visit | Attending: Internal Medicine | Admitting: Internal Medicine

## 2022-10-02 DIAGNOSIS — Z1231 Encounter for screening mammogram for malignant neoplasm of breast: Secondary | ICD-10-CM

## 2023-01-21 ENCOUNTER — Other Ambulatory Visit: Payer: Self-pay | Admitting: Cardiovascular Disease

## 2023-03-30 ENCOUNTER — Encounter: Payer: Self-pay | Admitting: Cardiovascular Disease

## 2023-03-30 ENCOUNTER — Ambulatory Visit: Payer: Medicare HMO | Attending: Cardiovascular Disease | Admitting: Cardiovascular Disease

## 2023-03-30 VITALS — BP 140/64 | HR 59 | Ht 60.0 in | Wt 218.0 lb

## 2023-03-30 DIAGNOSIS — G4733 Obstructive sleep apnea (adult) (pediatric): Secondary | ICD-10-CM

## 2023-03-30 DIAGNOSIS — I739 Peripheral vascular disease, unspecified: Secondary | ICD-10-CM

## 2023-03-30 DIAGNOSIS — I10 Essential (primary) hypertension: Secondary | ICD-10-CM

## 2023-03-30 DIAGNOSIS — E7800 Pure hypercholesterolemia, unspecified: Secondary | ICD-10-CM

## 2023-03-30 NOTE — Progress Notes (Signed)
Pain in the morning     03/30/2023 Alicia Parker   [birth year]  [id number]  Primary Physician Fleet Contras, MD Primary Cardiologist: Runell Gess MD Milagros Loll, [place], [place]  HPI:  Alicia Parker is a 77 y.o.   moderately overweight married African-American female mother of 3 children, grandmother of 8 grandchildren referred by Dr. Leretha Pol for evaluation treatment of symptomatic PAD.  I last saw her in the office 09/05/2021.  She is retired from working at [employer] in [employer].  Risk factors include 30 to 50 pack years of tobacco abuse having recently quit 11/18/2018.  She does have treated hypertension on low-dose amlodipine.  She also has obstructive sleep apnea on CPAP which is recently started.  There is no family history of heart disease.  She is never had a heart attack or stroke.  She denies chest pain or shortness of breath.  She has noticed bilateral lower extremity claudication which is fairly symmetric over the last year with recent lower extremity Dopplers performed 04/14/2019 revealing a right ABI 1.92 and a left ABI of 0.54 with a high-frequency signal in her left SFA.   I performed peripheral angiography on her 05/16/2019 revealing mid left SFA disease which I intervened on using a Eluv[place] drug-eluting stent with an excellent result.  She was discharged home the following day.  Her follow-up Dopplers performed 05/30/2019 revealed an increase in her left ABI from 0.54-0.8 with excellent velocity Doppler signals.  Her claudication has significantly improved.  Since I saw her in the office a year and a half ago she continues to do well.  She denies chest pain, shortness of breath or claudication.  She did have Doppler studies performed/18/23 that showed a widely patent left SFA stent with a left ABI of 0.19.   Current Meds  Medication Sig   acetic acid-hydrocortisone (VOSOL-HC) OTIC solution Place 3 drops into both ears 2 (two) times daily as needed.   amLODipine  (NORVASC) 2.5 MG tablet TAKE 1 TABLET EVERY DAY   aspirin EC 81 MG tablet Take 81 mg by mouth daily.   atorvastatin (LIPITOR) 20 MG tablet Take 1 tablet (20 mg total) by mouth daily.   Calcium Carb-Cholecalciferol 500-10 MG-MCG CHEW Chew 1 tablet by mouth daily.   clopidogrel (PLAVIX) 75 MG tablet TAKE 1 TABLET EVERY DAY   famotidine (PEPCID) 20 MG tablet TAKE 1 TABLET TWICE DAILY   ketoconazole (NIZORAL) 2 % cream Apply 1 application. topically daily. To the affected area till better   sertraline (ZOLOFT) 50 MG tablet TAKE 1 AND 1/2 TABLETS DAILY BY MOUTH (Patient taking differently: Take 100 mg by mouth daily.)     No Known Allergies  Social History   Socioeconomic History   Marital status: Married    Spouse name: Not on file   Number of children: Not on file   Years of education: Not on file   Highest education level: Not on file  Occupational History   Not on file  Tobacco Use   Smoking status: Former    Current packs/day: 0.00    Average packs/day: 1 pack/day for 10.0 years (10.0 ttl pk-yrs)    Types: Cigarettes    Start date: 11/2008    Quit date: 11/2018    Years since quitting: 4.3   Smokeless tobacco: Never  Vaping Use   Vaping status: Never Used  Substance and Sexual Activity   Alcohol use: Yes    Alcohol/week: 6.0 standard drinks of alcohol  Types: 6 Cans of beer per week    Comment: daily-    Drug use: No   Sexual activity: Yes    Partners: Male    Birth control/protection: Surgical    Comment: btl  Other Topics Concern   Not on file  Social History Narrative   Not on file   Social Determinants of Health   Financial Resource Strain: Not on file  Food Insecurity: Not on file  Transportation Needs: Not on file  Physical Activity: Not on file  Stress: Not on file  Social Connections: Not on file  Intimate Partner Violence: Not on file     Review of Systems: General: negative for chills, fever, night sweats or weight changes.  Cardiovascular:  negative for chest pain, dyspnea on exertion, edema, orthopnea, palpitations, paroxysmal nocturnal dyspnea or shortness of breath Dermatological: negative for rash Respiratory: negative for cough or wheezing Urologic: negative for hematuria Abdominal: negative for nausea, vomiting, diarrhea, bright red blood per rectum, melena, or hematemesis Neurologic: negative for visual changes, syncope, or dizziness All other systems reviewed and are otherwise negative except as noted above.    Blood pressure (!) 140/64, pulse (!) 59, height 5' (1.524 m), weight 218 lb (98.9 kg), last menstrual period 06/16/1997, SpO2 96%.  General appearance: alert and no distress Neck: no adenopathy, no carotid bruit, no JVD, supple, symmetrical, trachea midline, and thyroid not enlarged, symmetric, no tenderness/mass/nodules Lungs: clear to auscultation bilaterally Heart: regular rate and rhythm, S1, S2 normal, no murmur, click, rub or gallop Extremities: extremities normal, atraumatic, no cyanosis or edema Pulses: 2+ and symmetric Skin: Skin color, texture, turgor normal. No rashes or lesions Neurologic: Grossly normal  EKG EKG Interpretation Date/Time:  Monday March 30 2023 15:44:09 EDT Ventricular Rate:  59 PR Interval:  220 QRS Duration:  78 QT Interval:  432 QTC Calculation: 427 R Axis:   -21  Text Interpretation: Sinus bradycardia with 1st degree A-V block When compared with ECG of 11-Sep-2019 14:59, PREVIOUS ECG IS PRESENT Confirmed by Nanetta Batty [phone]) on 03/30/2023 4:12:58 PM    ASSESSMENT AND PLAN:   Elevated LDL cholesterol level History of hyperlipidemia on atorvastatin with lipid profile performed 07/18/2021 revealing total cholesterol 129, LDL of 52 and HDL of 61.  Peripheral arterial disease (HCC) History of PAD status post PTA and stenting of her left SFA by myself 05/16/2019 with an [place] drug-eluting stent.  Her Dopplers improved and her claudication resolved.  Her most recent  lower extremity arterial Doppler studies performed/18/23 revealed a widely patent left SFA stent with a left ABI of 0.92.  She denies claudication.  Will continue to follow with noninvasively.  Essential hypertension History of essential hypertension blood pressure measured today at 140/64.  She is on amlodipine.  OSA on CPAP History of obstructive sleep apnea on CPAP     Runell Gess MD [facility], [facility] 03/30/2023 4:18 PM

## 2023-03-30 NOTE — Assessment & Plan Note (Signed)
History of essential hypertension blood pressure measured today at 140/64.  She is on amlodipine.

## 2023-03-30 NOTE — Assessment & Plan Note (Signed)
History of hyperlipidemia on atorvastatin with lipid profile performed 07/18/2021 revealing total cholesterol 129, LDL of 52 and HDL of 61.

## 2023-03-30 NOTE — Assessment & Plan Note (Signed)
History of PAD status post PTA and stenting of her left SFA by myself 05/16/2019 with an [place] drug-eluting stent.  Her Dopplers improved and her claudication resolved.  Her most recent lower extremity arterial Doppler studies performed/18/23 revealed a widely patent left SFA stent with a left ABI of 0.92.  She denies claudication.  Will continue to follow with noninvasively.

## 2023-03-30 NOTE — Assessment & Plan Note (Signed)
History of obstructive sleep apnea on CPAP.

## 2023-03-30 NOTE — Patient Instructions (Signed)
Medication Instructions:  Your physician recommends that you continue on your current medications as directed. Please refer to the Current Medication list given to you today.  *If you need a refill on your cardiac medications before your next appointment, please call your pharmacy*   Testing/Procedures: Your physician has requested that you have a lower extremity arterial duplex. During this test, ultrasound is used to evaluate arterial blood flow in the legs. Allow one hour for this exam. There are no restrictions or special instructions. This will take place at [address], [address].  Your physician has requested that you have an ankle brachial index (ABI). During this test an ultrasound and blood pressure cuff are used to evaluate the arteries that supply the arms and legs with blood. Allow thirty minutes for this exam. There are no restrictions or special instructions. This will take place at [address] [facility], [address].      Follow-Up: At [facility], you and your health needs are our priority.  As part of our continuing mission to provide you with exceptional heart care, we have created designated Provider Care Teams.  These Care Teams include your primary Cardiologist (physician) and Advanced Practice Providers (APPs -  Physician Assistants and Nurse Practitioners) who all work together to provide you with the care you need, when you need it.  We recommend signing up for the patient portal called "MyChart".  Sign up information is provided on this After Visit Summary.  MyChart is used to connect with patients for Virtual Visits (Telemedicine).  Patients are able to view lab/test results, encounter notes, upcoming appointments, etc.  Non-urgent messages can be sent to your provider as well.   To learn more about what you can do with MyChart, go to [email or web address].    Your next appointment:   12 month(s)  Provider:   Nanetta Batty, MD

## 2023-04-08 ENCOUNTER — Ambulatory Visit (HOSPITAL_COMMUNITY): Payer: Medicare HMO

## 2023-04-13 ENCOUNTER — Ambulatory Visit (HOSPITAL_COMMUNITY)
Admission: RE | Admit: 2023-04-13 | Discharge: 2023-04-13 | Disposition: A | Discharge status: Home / Self Care | Payer: Medicare HMO | Source: Ambulatory Visit | Attending: Cardiovascular Disease | Admitting: Cardiovascular Disease

## 2023-04-13 DIAGNOSIS — I10 Essential (primary) hypertension: Secondary | ICD-10-CM | POA: Diagnosis present

## 2023-04-13 DIAGNOSIS — E7800 Pure hypercholesterolemia, unspecified: Secondary | ICD-10-CM | POA: Diagnosis present

## 2023-04-13 DIAGNOSIS — I739 Peripheral vascular disease, unspecified: Secondary | ICD-10-CM | POA: Insufficient documentation

## 2023-04-14 LAB — VAS US ABI WITH/WO TBI
Left ABI: 0.92
Right ABI: 0.95

## 2023-05-04 ENCOUNTER — Other Ambulatory Visit (HOSPITAL_BASED_OUTPATIENT_CLINIC_OR_DEPARTMENT_OTHER): Payer: Self-pay | Admitting: Cardiovascular Disease

## 2023-05-04 DIAGNOSIS — I739 Peripheral vascular disease, unspecified: Secondary | ICD-10-CM

## 2023-06-23 NOTE — Progress Notes (Signed)
 Alicia Parker

## 2023-06-23 NOTE — Patient Instructions (Signed)

## 2023-06-23 NOTE — Progress Notes (Signed)
 PATIENT: Alicia Parker DOB: [birth year]  REASON FOR VISIT: follow up HISTORY FROM: patient  Chief Complaint  Patient presents with   Room 1    Pt is here Alone. Pt states that it's been okay using her CPAP Machine. Pt states that her CPAP sometimes break down on her and she needs a new machine.      HISTORY OF PRESENT ILLNESS:  06/24/23 ALL:  Alicia Parker returns for follow up for OSA on CPAP. She reports doing well on therapy. She is using her machine most every night. Using FFM. She does note a leak but not bothersome. She did have two separate nights over the past couple of months where her machine would not turn on. She took it to DME and was give a new cord. She has not had any trouble recently. Set up 04/2019.     07/02/2022 ALL:  Alicia Parker returns for follow up for OSA on CPAP. She was last seen 02/2021 and felt that she was not getting enough air. We increased max pressure from 14 to 16cmH20. Since, she has done well. She is using CAP nightly for about 7-8 hours. She does continues to feel sleepy during the day. She admits that she needs to increase activity. She is planning to start an exercise program for weight management. She drnks 3-4 beers daily and wanting to cut back.     03/06/2021 ALL: Alicia Parker returns for follow up for OSA on CPAP. She was last seen 12/2019 and having difficulty an air leak, however, compliance had improved and AHI was well managed. Since, she has continued CPAP. She called yesterday to report something was off with the air. She feels that she doesn't get enough air at times. She doesn't feel significantly different but does note that she does not rest as well if she does not use her CPAP.     12/22/2019 ALL:  Alicia Parker is a 78 y.o. female here today for follow up for OSA on CPAP. She is doing better with compliance. She continues to struggle with getting comfortable with machine. She has noted a leak in her mask. She is able to correct it when she  tightens her headgear. She does feel better when using CPAP. She was unable to go to sleep one night this week after removing her mask. She fell back to sleep quickly once replacing it.   Compliance report dated 11/21/2019 through 12/20/2019 reveals that she used CPAP 30 of the past 30 days for compliance of 100%.  She used CPAP greater than 4 hours all 30 days.  Average usage was 6 hours and 40 minutes.  Visual AHI was 5.3 on 7 to 14 cm of water and an EPR of three.  There was a significant leak noted in the 95th percentile of 40.7.  HISTORY: (copied from my note on 06/23/2019)  Alicia Parker is a 78 y.o. female here today for follow up. [name] returns today for initial compliance review after being diagnosed with severe obstructive sleep apnea. She was started on CPAP about 2 months ago. She reports that she is doing well on CPAP. She does note improvement in energy. There are some mornings where she has not noticed much benefit with using CPAP. She is motivated to continue using CPAP. She is having no difficulties with the machine or supplies.   Compliance report dated 05/23/2019 through 06/21/2019 reveals that she use CPAP every night the last 30 days for compliance of 100%. She  CPAP greater than 4 hours every night. Average usage was 6 hours and 17 minutes. Residual AHI was 4 on 7 to 14 cm of water and an EPR of 3. There was no significant leak.   HISTORY: (copied from Dr Obie note on 03/03/2019)   Dear Dr. Tish, I saw your patient, [name], upon your kind request in my sleep clinic today for initial consultation of her sleep disorder, in particular, reevaluation of her prior diagnosis of obstructive sleep apnea.  The patient is unaccompanied today.  As you know, Alicia Parker is a 78 year old right-handed woman with an underlying medical history of reflux disease, anxiety, hypertension, hyperlipidemia, cataracts and obesity, who was previously diagnosed with obstructive sleep apnea and placed on  CPAP therapy.  Prior sleep study results are not available for my review today.  She has not been using her CPAP machine in some years.  She has had some weight gain over time.  I reviewed your office note from 02/18/2019.  Her Epworth sleepiness score is 17 out of 24, fatigue severity score is 54 out of 63.  She is married and lives with her husband and daughter.  She has 3 children.  She quit smoking in June 2020.  She drinks alcohol in the form of beer, about 2/day but is cutting back, and caffeine in the form of coffee, about 1 or 1-1/2 cups/day on average.  She does not typically drink soda on a day-to-day basis or tea.  She has no pets, she has a TV in the bedroom and falls asleep with the TV on.  She is generally in bed before 9 and rise time varies, between 7 and 10.  She is retired from engineering geologist work.  She reports that her husband has PTSD and is a very restless sleeper and she is planning to separate their bedrooms as none of them are sleeping well.  She has a family history of sleep apnea and her brother who has a CPAP machine and her son.  She has 2 sons and 1 daughter.  She estimates that she has not been on CPAP therapy since 2004, she reports that her machine was from 2000.  She had difficulty with the full facemask.  She would be willing to get retested and consider CPAP therapy again.  She denies recurrent morning headaches but has nocturia about 2 or 3 times per average night and also allergy symptoms.    REVIEW OF SYSTEMS: Out of a complete 14 system review of symptoms, none the patient complains only of the following symptoms, daytime sleepiness and all other reviewed systems are negative.  ESS: 17/24, previously 13/24  ALLERGIES: No Known Allergies  HOME MEDICATIONS: Outpatient Medications Prior to Visit  Medication Sig Dispense Refill   acetic acid -hydrocortisone  (VOSOL -HC) OTIC solution Place 3 drops into both ears 2 (two) times daily as needed. 10 mL 2   amLODipine  (NORVASC ) 2.5 MG  tablet TAKE 1 TABLET EVERY DAY 90 tablet 1   aspirin  EC 81 MG tablet Take 81 mg by mouth daily.     atorvastatin  (LIPITOR) 20 MG tablet Take 1 tablet (20 mg total) by mouth daily. 90 tablet 0   Calcium  Carb-Cholecalciferol 500-10 MG-MCG CHEW Chew 1 tablet by mouth daily.     clopidogrel  (PLAVIX ) 75 MG tablet TAKE 1 TABLET EVERY DAY 90 tablet 3   famotidine  (PEPCID ) 20 MG tablet TAKE 1 TABLET TWICE DAILY 180 tablet 1   ketoconazole  (NIZORAL ) 2 % cream Apply 1 application. topically daily.  To the affected area till better 30 g 0   Naproxen Sodium (ALEVE PO) Take 1 capsule by mouth as needed.     sertraline  (ZOLOFT ) 50 MG tablet TAKE 1 AND 1/2 TABLETS DAILY BY MOUTH (Patient taking differently: Take 100 mg by mouth daily.) 135 tablet 1   bismuth subsalicylate (PEPTO BISMOL) 262 MG/15ML suspension Take 30 mLs by mouth every 6 (six) hours as needed for indigestion. (Patient not taking: Reported on 03/30/2023)     diclofenac  Sodium (VOLTAREN ) 1 % GEL Apply 2 g topically 2 (two) times daily as needed. (Patient not taking: Reported on 06/24/2023) 350 g 2   loratadine (CLARITIN) 10 MG tablet Take 10 mg by mouth at bedtime. (Patient not taking: Reported on 03/30/2023)     methocarbamol  (ROBAXIN ) 500 MG tablet Take 1 tablet (500 mg total) by mouth every 6 (six) hours as needed for muscle spasms. (Patient not taking: Reported on 03/30/2023) 30 tablet 3   No facility-administered medications prior to visit.    PAST MEDICAL HISTORY: Past Medical History:  Diagnosis Date   Anxiety    Cataract    Clotting disorder (HCC)    Essential hypertension, benign    GERD (gastroesophageal reflux disease)    Hx of colonic polyps 2017   Hyperlipidemia    no current medications   PAD (peripheral artery disease) (HCC) 2020   Sleep apnea    doesnt wear cpap     PAST SURGICAL HISTORY: Past Surgical History:  Procedure Laterality Date   ABDOMINAL AORTOGRAM W/LOWER EXTREMITY N/A 05/16/2019   Procedure: ABDOMINAL  AORTOGRAM W/LOWER EXTREMITY;  Surgeon: Court Dorn PARAS, MD;  Location: [facility];  Service: Cardiovascular;  Laterality: N/A;   APPENDECTOMY  06/16/1966   CHOLECYSTECTOMY  06/16/1966   COLONOSCOPY  06/16/2002   polyps   EYE SURGERY Bilateral    oct 2023   PERIPHERAL VASCULAR INTERVENTION  05/16/2019   Procedure: PERIPHERAL VASCULAR INTERVENTION;  Surgeon: Court Dorn PARAS, MD;  Location: [facility];  Service: Cardiovascular;;   REDUCTION MAMMAPLASTY Bilateral    TOE SURGERY     TUBAL LIGATION  06/16/1966   UPPER GASTROINTESTINAL ENDOSCOPY      FAMILY HISTORY: Family History  Problem Relation Age of Onset   Hypertension Mother    Stroke Mother    Hypertension Father    Leukemia Father 52   Early death Brother        pneumonia   Hypertension Maternal Grandmother    Early death Brother    Colon polyps Sister    Colon cancer Neg Hx    Esophageal cancer Neg Hx    Rectal cancer Neg Hx    Stomach cancer Neg Hx     SOCIAL HISTORY: Social History   Socioeconomic History   Marital status: Married    Spouse name: Not on file   Number of children: Not on file   Years of education: Not on file   Highest education level: Not on file  Occupational History   Not on file  Tobacco Use   Smoking status: Former    Current packs/day: 0.00    Average packs/day: 1 pack/day for 10.0 years (10.0 ttl pk-yrs)    Types: Cigarettes    Start date: 11/2008    Quit date: 11/2018    Years since quitting: 4.6   Smokeless tobacco: Never  Vaping Use   Vaping status: Never Used  Substance and Sexual Activity   Alcohol use: Yes    Alcohol/week:  6.0 standard drinks of alcohol    Types: 6 Cans of beer per week    Comment: daily-    Drug use: No   Sexual activity: Yes    Partners: Male    Birth control/protection: Surgical    Comment: btl  Other Topics Concern   Not on file  Social History Narrative   Not on file   Social Drivers of Health   Financial Resource  Strain: Not on file  Food Insecurity: Not on file  Transportation Needs: Not on file  Physical Activity: Not on file  Stress: Not on file  Social Connections: Not on file  Intimate Partner Violence: Not on file      PHYSICAL EXAM  Vitals:   06/24/23 1345  BP: 130/73  Pulse: 64  Weight: 221 lb 8 oz (100.5 kg)  Height: 5' (1.524 m)      Body mass index is 43.26 kg/m.  Generalized: Well developed, in no acute distress  Cardiology: normal rate and rhythm, no murmur noted Respiratory: clear to auscultation bilaterally  Neurological examination  Mentation: Alert oriented to time, place, history taking. Follows all commands speech and language fluent Cranial nerve II-XII: Pupils were equal round reactive to light. Extraocular movements were full, visual field were full  Motor: The motor testing reveals 5 over 5 strength of all 4 extremities. Good symmetric motor tone is noted throughout.  Gait and station: Gait is normal.   DIAGNOSTIC DATA (LABS, IMAGING, TESTING) - I reviewed patient records, labs, notes, testing and imaging myself where available.      No data to display           Lab Results  Component Value Date   WBC 6.3 07/18/2021   HGB 11.1 (L) 07/18/2021   HCT 34.6 (L) 07/18/2021   MCV 83.8 07/18/2021   PLT 308 07/18/2021      Component Value Date/Time   NA 137 07/18/2021 0000   NA 138 08/06/2020 1011   K 4.6 07/18/2021 0000   CL 102 07/18/2021 0000   CO2 26 07/18/2021 0000   GLUCOSE 80 07/18/2021 0000   BUN 15 07/18/2021 0000   BUN 15 08/06/2020 1011   CREATININE 0.87 07/18/2021 0000   CALCIUM  9.4 07/18/2021 0000   PROT 6.4 07/18/2021 0000   PROT 6.5 08/06/2020 1011   ALBUMIN 4.1 08/06/2020 1011   AST 15 07/18/2021 0000   ALT 14 07/18/2021 0000   ALKPHOS 138 (H) 08/06/2020 1011   BILITOT 0.4 07/18/2021 0000   BILITOT 0.5 08/06/2020 1011   GFRNONAA 62 08/06/2020 1011   GFRNONAA 52 (L) 04/25/2015 1706   GFRAA 71 08/06/2020 1011   GFRAA 60  04/25/2015 1706   Lab Results  Component Value Date   CHOL 129 07/18/2021   HDL 61 07/18/2021   LDLCALC 52 07/18/2021   TRIG 81 07/18/2021   CHOLHDL 2.1 07/18/2021   Lab Results  Component Value Date   HGBA1C 6.1 (H) 08/06/2020   Lab Results  Component Value Date   VITAMINB12 667 07/18/2021   Lab Results  Component Value Date   TSH 2.64 07/18/2021       ASSESSMENT AND PLAN 78 y.o. year old female  has a past medical history of Anxiety, Cataract, Clotting disorder (HCC), Essential hypertension, benign, GERD (gastroesophageal reflux disease), colonic polyps (2017), Hyperlipidemia, PAD (peripheral artery disease) (HCC) (2020), and Sleep apnea. here with     ICD-10-CM   1. OSA on CPAP  G47.33 For home use only DME  continuous positive airway pressure (CPAP)       Malin is doing well on CPAP therapy. Compliance report shows excellent compliance. I have encouraged her to continue nightly use and use greater than 4 hours each night. We have discussed elevated ESS scores. I have encouraged her to discuss weight management plan with PCP. Consider reducing alcohol intake. Healthy lifestyle habits encouraged. She will follow up with me in 1 year, sooner if needed. She verbalizes understanding and agreement with this plan.    Orders Placed This Encounter  Procedures   For home use only DME continuous positive airway pressure (CPAP)    Heated Humidity with all supplies as needed    Length of Need:   Lifetime    Patient has OSA or probable OSA:   Yes    Is the patient currently using CPAP in the home:   Yes    Settings:   Other see comments    CPAP supplies needed:   Mask, headgear, cushions, filters, heated tubing and water chamber      No orders of the defined types were placed in this encounter.      Greig Forbes, FNP-C 06/24/2023, 2:02 PM [facility] [address] [place], [place] [address] [phone]

## 2023-06-24 ENCOUNTER — Encounter: Payer: Self-pay | Admitting: Family Medicine

## 2023-06-24 ENCOUNTER — Ambulatory Visit: Payer: Medicare HMO | Admitting: Family Medicine

## 2023-06-24 VITALS — BP 130/73 | HR 64 | Ht 60.0 in | Wt 221.5 lb

## 2023-06-24 DIAGNOSIS — G4733 Obstructive sleep apnea (adult) (pediatric): Secondary | ICD-10-CM | POA: Diagnosis not present

## 2023-08-19 ENCOUNTER — Other Ambulatory Visit: Payer: Self-pay | Admitting: Nurse Practitioner

## 2023-08-19 DIAGNOSIS — Z1231 Encounter for screening mammogram for malignant neoplasm of breast: Secondary | ICD-10-CM

## 2023-10-05 ENCOUNTER — Ambulatory Visit
Admission: RE | Admit: 2023-10-05 | Discharge: 2023-10-05 | Disposition: A | Discharge status: Home / Self Care | Source: Ambulatory Visit | Attending: Nurse Practitioner | Admitting: Nurse Practitioner

## 2023-10-05 DIAGNOSIS — Z1231 Encounter for screening mammogram for malignant neoplasm of breast: Secondary | ICD-10-CM

## 2023-11-16 ENCOUNTER — Telehealth: Payer: Self-pay | Admitting: Cardiovascular Disease

## 2023-11-16 NOTE — Telephone Encounter (Signed)
 Pt has been on Plavix  and ASA since 2020: For some reason, she was discharged home on aspirin  alone and not Plavix . We will start 75 mg of clopidogrel  daily.   Pt just asking if she needs to be on both.. she is not having any problems... I advised her that Dr Katheryne Pane is out this week but I will send to him to review when he gets back next week.

## 2023-11-16 NOTE — Telephone Encounter (Signed)
 Pt c/o medication issue:  1. Name of Medication:  clopidogrel  (PLAVIX ) 75 MG tablet  2. How are you currently taking this medication (dosage and times per day)?  As prescribed  3. Are you having a reaction (difficulty breathing--STAT)?   4. What is your medication issue?   Patient would like to know if it is alright for her to take Clopidogrel  while taking Aspirin . She says she takes both tablets once daily.

## 2023-11-26 NOTE — Telephone Encounter (Signed)
 Avanell Leigh, MD to [facility] Triage   Continue ASA 81 mg. OK to DC plavix  at this point   Called pt with Dr. Arlester Ladd recommendations. Pt will stop plavix  and comtinue aspirin  81mg  daily. Pt verbalizes understanding.

## 2024-03-09 ENCOUNTER — Telehealth: Payer: Self-pay | Admitting: Family Medicine

## 2024-03-09 NOTE — Telephone Encounter (Signed)
 Can you check to see if her previous visit will be accepted if we repeat HST for new machine? Does she need a new visit prior to me ordering HST?. TY!

## 2024-03-09 NOTE — Telephone Encounter (Signed)
 Spoke w/ Amy. Relayed update from [place]. Per Amy, pt can wait to see her 06/23/24 if current machine working ok and then she can order HST then.  Or if pt prefers, we can schedule a sooner mychart VV follow up.

## 2024-03-09 NOTE — Telephone Encounter (Signed)
 Since she has Medicare she would need to have a VV or a f76f within 6 months with having the HST. She was last seen 06/2023.

## 2024-03-09 NOTE — Telephone Encounter (Signed)
 I called the patient to inform her of her insurance protocol for a new CPAP machine. Patient decided she is fine with waiting until her January appointment for a new machine order. If patient changes her mind she will call back to schedule an earlier appointment with Amy.   FYI

## 2024-03-30 ENCOUNTER — Encounter: Payer: Self-pay | Admitting: Cardiovascular Disease

## 2024-03-30 ENCOUNTER — Ambulatory Visit: Attending: Cardiovascular Disease | Admitting: Cardiovascular Disease

## 2024-03-30 VITALS — BP 135/71 | HR 65 | Ht 60.0 in | Wt 218.4 lb

## 2024-03-30 DIAGNOSIS — I739 Peripheral vascular disease, unspecified: Secondary | ICD-10-CM

## 2024-03-30 DIAGNOSIS — I10 Essential (primary) hypertension: Secondary | ICD-10-CM

## 2024-03-30 DIAGNOSIS — E7800 Pure hypercholesterolemia, unspecified: Secondary | ICD-10-CM

## 2024-03-30 NOTE — Assessment & Plan Note (Signed)
 History of hyperlipidemia on atorvastatin .  Will recheck a lipid liver profile today.

## 2024-03-30 NOTE — Patient Instructions (Signed)
 Medication Instructions:  Your physician recommends that you continue on your current medications as directed. Please refer to the Current Medication list given to you today.  *If you need a refill on your cardiac medications before your next appointment, please call your pharmacy*  Lab Work: Your physician recommends that you have labs drawn today: Lipid/liver panel  If you have labs (blood work) drawn today and your tests are completely normal, you will receive your results only by: MyChart Message (if you have MyChart) OR A paper copy in the mail If you have any lab test that is abnormal or we need to change your treatment, we will call you to review the results.  Testing/Procedures: Your physician has requested that you have a lower extremity arterial duplex. During this test, ultrasound is used to evaluate arterial blood flow in the legs. Allow one hour for this exam. There are no restrictions or special instructions. This will take place at [address] 4th floor **To do be done in November**  Please note: We ask at that you not bring children with you during ultrasound (echo/ vascular) testing. Due to room size and safety concerns, children are not allowed in the ultrasound rooms during exams. Our front office staff cannot provide observation of children in our lobby area while testing is being conducted. An adult accompanying a patient to their appointment will only be allowed in the ultrasound room at the discretion of the ultrasound technician under special circumstances. We apologize for any inconvenience.  Your physician has requested that you have an ankle brachial index (ABI). During this test an ultrasound and blood pressure cuff are used to evaluate the arteries that supply the arms and legs with blood. Allow thirty minutes for this exam. There are no restrictions or special instructions. This will take place at [address] 4th floor  **To do be done in  November**   Please note: We ask at that you not bring children with you during ultrasound (echo/ vascular) testing. Due to room size and safety concerns, children are not allowed in the ultrasound rooms during exams. Our front office staff cannot provide observation of children in our lobby area while testing is being conducted. An adult accompanying a patient to their appointment will only be allowed in the ultrasound room at the discretion of the ultrasound technician under special circumstances. We apologize for any inconvenience.   Follow-Up: At [facility], you and your health needs are our priority.  As part of our continuing mission to provide you with exceptional heart care, our providers are all part of one team.  This team includes your primary Cardiologist (physician) and Advanced Practice Providers or APPs (Physician Assistants and Nurse Practitioners) who all work together to provide you with the care you need, when you need it.  Your next appointment:   12 month(s)  Provider:   Dorn Lesches, MD

## 2024-03-30 NOTE — Assessment & Plan Note (Signed)
 History of PAD status post right SFA intervention by myself 05/16/2019 with an Eluvia 6 mm x 40 mm long drug-eluting stent.  She did have one-vessel runoff bilaterally.  She denies claudication.  Dopplers performed 04/05/2023 suggest a widely patent stent with stable ABIs.  These will be repeated on an annual basis.

## 2024-03-30 NOTE — Progress Notes (Signed)
 03/30/2024 ILEAN SPRADLIN   05-Dec-1945  [id number]  Primary Physician Shelda Atlas, MD Primary Cardiologist: Dorn JINNY Lesches MD GENI SIX, Andrew, FSCAI  HPI:  Alicia Parker is a 78 y.o.   moderately overweight married African-American female mother of 3 children, grandmother of 8 grandchildren referred by Dr. Melonie for evaluation treatment of symptomatic PAD.  I last saw her in the office 03/30/2023.  She is retired from working at [employer] in [employer].  Risk factors include 30 to 50 pack years of tobacco abuse having recently quit 11/18/2018.  She does have treated hypertension on low-dose amlodipine .  She also has obstructive sleep apnea on CPAP which is recently started.  There is no family history of heart disease.  She is never had a heart attack or stroke.  She denies chest pain or shortness of breath.  She has noticed bilateral lower extremity claudication which is fairly symmetric over the last year with recent lower extremity Dopplers performed 04/14/2019 revealing a right ABI 1.92 and a left ABI of 0.54 with a high-frequency signal in her left SFA.   I performed peripheral angiography on her 05/16/2019 revealing mid left SFA disease which I intervened on using a Eluvia drug-eluting stent with an excellent result.  She was discharged home the following day.  Her follow-up Dopplers performed 05/30/2019 revealed an increase in her left ABI from 0.54-0.8 with excellent velocity Doppler signals.  Her claudication has significantly improved.  Since I saw her in the office a year ago she is remained stable.  She denies chest pain or shortness of breath.  Her Doppler studies performed 04/13/2023 revealed stable ABIs in the 0.9 range with a patent left SFA stent.   Current Meds  Medication Sig   acetic acid -hydrocortisone  (VOSOL -HC) OTIC solution Place 3 drops into both ears 2 (two) times daily as needed.   aspirin  EC 81 MG tablet Take 81 mg by mouth daily.   atorvastatin   (LIPITOR) 20 MG tablet Take 1 tablet (20 mg total) by mouth daily.   bismuth subsalicylate (PEPTO BISMOL) 262 MG/15ML suspension Take 30 mLs by mouth every 6 (six) hours as needed for indigestion.   Calcium  Carb-Cholecalciferol 500-10 MG-MCG CHEW Chew 1 tablet by mouth daily.   diclofenac  Sodium (VOLTAREN ) 1 % GEL Apply 2 g topically 2 (two) times daily as needed.   famotidine  (PEPCID ) 20 MG tablet TAKE 1 TABLET TWICE DAILY   ketoconazole  (NIZORAL ) 2 % cream Apply 1 application. topically daily. To the affected area till better   loratadine (CLARITIN) 10 MG tablet Take 10 mg by mouth at bedtime.   Naproxen Sodium (ALEVE PO) Take 1 capsule by mouth as needed.   sertraline  (ZOLOFT ) 50 MG tablet TAKE 1 AND 1/2 TABLETS DAILY BY MOUTH (Patient taking differently: Take 100 mg by mouth daily.)     No Known Allergies  Social History   Socioeconomic History   Marital status: Married    Spouse name: Not on file   Number of children: Not on file   Years of education: Not on file   Highest education level: Not on file  Occupational History   Not on file  Tobacco Use   Smoking status: Former    Current packs/day: 0.00    Average packs/day: 1 pack/day for 10.0 years (10.0 ttl pk-yrs)    Types: Cigarettes    Start date: 11/2008    Quit date: 11/2018    Years since quitting: 5.3   Smokeless tobacco:  Never  Vaping Use   Vaping status: Never Used  Substance and Sexual Activity   Alcohol use: Yes    Alcohol/week: 6.0 standard drinks of alcohol    Types: 6 Cans of beer per week    Comment: daily-    Drug use: No   Sexual activity: Yes    Partners: Male    Birth control/protection: Surgical    Comment: btl  Other Topics Concern   Not on file  Social History Narrative   Not on file   Social Drivers of Health   Financial Resource Strain: Not on file  Food Insecurity: Not on file  Transportation Needs: Not on file  Physical Activity: Not on file  Stress: Not on file  Social  Connections: Not on file  Intimate Partner Violence: Not on file     Review of Systems: General: negative for chills, fever, night sweats or weight changes.  Cardiovascular: negative for chest pain, dyspnea on exertion, edema, orthopnea, palpitations, paroxysmal nocturnal dyspnea or shortness of breath Dermatological: negative for rash Respiratory: negative for cough or wheezing Urologic: negative for hematuria Abdominal: negative for nausea, vomiting, diarrhea, bright red blood per rectum, melena, or hematemesis Neurologic: negative for visual changes, syncope, or dizziness All other systems reviewed and are otherwise negative except as noted above.    Blood pressure 135/71, pulse 65, height 5' (1.524 m), weight 218 lb 6.4 oz (99.1 kg), last menstrual period 06/16/1997, SpO2 94%.  General appearance: alert and no distress Neck: no adenopathy, no carotid bruit, no JVD, supple, symmetrical, trachea midline, and thyroid  not enlarged, symmetric, no tenderness/mass/nodules Lungs: clear to auscultation bilaterally Heart: regular rate and rhythm, S1, S2 normal, no murmur, click, rub or gallop Extremities: extremities normal, atraumatic, no cyanosis or edema Pulses: Absent pedal pulses Skin: Skin color, texture, turgor normal. No rashes or lesions Neurologic: Grossly normal  EKG EKG Interpretation Date/Time:  Wednesday March 30 2024 14:31:14 EDT Ventricular Rate:  58 PR Interval:  202 QRS Duration:  78 QT Interval:  432 QTC Calculation: 424 R Axis:   -19  Text Interpretation: Sinus bradycardia Minimal voltage criteria for LVH, may be normal variant ( R in aVL ) When compared with ECG of 30-Mar-2023 15:44, No significant change was found Confirmed by Court Carrier [phone]) on 03/30/2024 2:52:24 PM    ASSESSMENT AND PLAN:   Elevated LDL cholesterol level History of hyperlipidemia on atorvastatin .  Will recheck a lipid liver profile today.  Peripheral arterial disease History of  PAD status post right SFA intervention by myself 05/16/2019 with an Eluvia 6 mm x 40 mm long drug-eluting stent.  She did have one-vessel runoff bilaterally.  She denies claudication.  Dopplers performed 04/05/2023 suggest a widely patent stent with stable ABIs.  These will be repeated on an annual basis.  Essential hypertension History of essential hypertension blood pressure measured today 135/71.  She is on low-dose amlodipine .     Carrier DOROTHA Court MD [facility], [facility] 03/30/2024 2:59 PM

## 2024-03-30 NOTE — Assessment & Plan Note (Signed)
 History of essential hypertension blood pressure measured today 135/71.  She is on low-dose amlodipine .

## 2024-03-31 ENCOUNTER — Ambulatory Visit: Payer: Self-pay | Admitting: Cardiovascular Disease

## 2024-03-31 LAB — HEPATIC FUNCTION PANEL
ALT: 12 IU/L (ref 0–32)
AST: 16 IU/L (ref 0–40)
Albumin: 3.9 g/dL (ref 3.8–4.8)
Alkaline Phosphatase: 136 IU/L — ABNORMAL HIGH (ref 49–135)
Bilirubin Total: 0.7 mg/dL (ref 0.0–1.2)
Bilirubin, Direct: 0.25 mg/dL (ref 0.00–0.40)
Total Protein: 6.3 g/dL (ref 6.0–8.5)

## 2024-03-31 LAB — LIPID PANEL
Chol/HDL Ratio: 2.1 ratio (ref 0.0–4.4)
Cholesterol, Total: 133 mg/dL (ref 100–199)
HDL: 63 mg/dL (ref >39)
LDL Chol Calc (NIH): 58 mg/dL (ref 0–99)
Triglycerides: 56 mg/dL (ref 0–149)
VLDL Cholesterol Cal: 12 mg/dL (ref 5–40)

## 2024-04-13 ENCOUNTER — Ambulatory Visit (HOSPITAL_COMMUNITY)

## 2024-04-20 ENCOUNTER — Ambulatory Visit: Payer: Self-pay | Admitting: Cardiovascular Disease

## 2024-04-20 ENCOUNTER — Ambulatory Visit (HOSPITAL_COMMUNITY)
Admission: RE | Admit: 2024-04-20 | Discharge: 2024-04-20 | Disposition: A | Source: Ambulatory Visit | Attending: Cardiovascular Disease | Admitting: Cardiovascular Disease

## 2024-04-20 ENCOUNTER — Ambulatory Visit (HOSPITAL_COMMUNITY)
Admission: RE | Admit: 2024-04-20 | Discharge: 2024-04-20 | Disposition: A | Discharge status: Home / Self Care | Source: Ambulatory Visit | Attending: Cardiovascular Disease | Admitting: Cardiovascular Disease

## 2024-04-20 DIAGNOSIS — I739 Peripheral vascular disease, unspecified: Secondary | ICD-10-CM | POA: Insufficient documentation

## 2024-04-20 LAB — VAS US ABI WITH/WO TBI
Left ABI: 0.82
Right ABI: 0.8

## 2024-06-23 ENCOUNTER — Ambulatory Visit: Payer: Medicare HMO | Admitting: Family Medicine

## 2024-07-18 NOTE — Patient Instructions (Incomplete)

## 2024-07-18 NOTE — Progress Notes (Unsigned)
 "   PATIENT: Alicia Parker DOB: [birth year]  REASON FOR VISIT: follow up HISTORY FROM: patient  No chief complaint on file.    HISTORY OF PRESENT ILLNESS:  07/18/24 ALL:  Alicia Parker returns for follow up for OSA on CPAP. She continues to do well on therapy. She is using her machine nightly for about   She is eligible for a new machine.   06/24/2023 ALL:  Alicia Parker returns for follow up for OSA on CPAP. She reports doing well on therapy. She is using her machine most every night. Using FFM. She does note a leak but not bothersome. She did have two separate nights over the past couple of months where her machine would not turn on. She took it to DME and was give a new cord. She has not had any trouble recently. Set up 04/2019.     07/02/2022 ALL:  Alicia Parker returns for follow up for OSA on CPAP. She was last seen 02/2021 and felt that she was not getting enough air. We increased max pressure from 14 to 16cmH20. Since, she has done well. She is using CAP nightly for about 7-8 hours. She does continues to feel sleepy during the day. She admits that she needs to increase activity. She is planning to start an exercise program for weight management. She drnks 3-4 beers daily and wanting to cut back.     03/06/2021 ALL: Alicia Parker returns for follow up for OSA on CPAP. She was last seen 12/2019 and having difficulty an air leak, however, compliance had improved and AHI was well managed. Since, she has continued CPAP. She called yesterday to report something was off with the air. She feels that she doesn't get enough air at times. She doesn't feel significantly different but does note that she does not rest as well if she does not use her CPAP.     12/22/2019 ALL:  Alicia Parker is a 79 y.o. female here today for follow up for OSA on CPAP. She is doing better with compliance. She continues to struggle with getting comfortable with machine. She has noted a leak in her mask. She is able to correct it when  she tightens her headgear. She does feel better when using CPAP. She was unable to go to sleep one night this week after removing her mask. She fell back to sleep quickly once replacing it.   Compliance report dated 11/21/2019 through 12/20/2019 reveals that she used CPAP 30 of the past 30 days for compliance of 100%.  She used CPAP greater than 4 hours all 30 days.  Average usage was 6 hours and 40 minutes.  Visual AHI was 5.3 on 7 to 14 cm of water and an EPR of three.  There was a significant leak noted in the 95th percentile of 40.7.  HISTORY: (copied from my note on 06/23/2019)  Alicia Parker is a 79 y.o. female here today for follow up. Alicia Parker returns today for initial compliance review after being diagnosed with severe obstructive sleep apnea. She was started on CPAP about 2 months ago. She reports that she is doing well on CPAP. She does note improvement in energy. There are some mornings where she has not noticed much benefit with using CPAP. She is motivated to continue using CPAP. She is having no difficulties with the machine or supplies.   Compliance report dated 05/23/2019 through 06/21/2019 reveals that she use CPAP every night the last 30 days for compliance of 100%. She CPAP  greater than 4 hours every night. Average usage was 6 hours and 17 minutes. Residual AHI was 4 on 7 to 14 cm of water and an EPR of 3. There was no significant leak.   HISTORY: (copied from Dr Obie note on 03/03/2019)   Dear Dr. Tish, I saw your patient, Alicia Parker, upon your kind request in my sleep clinic today for initial consultation of her sleep disorder, in particular, reevaluation of her prior diagnosis of obstructive sleep apnea.  The patient is unaccompanied today.  As you know, Alicia Parker is a 79 year old right-handed woman with an underlying medical history of reflux disease, anxiety, hypertension, hyperlipidemia, cataracts and obesity, who was previously diagnosed with obstructive sleep apnea and placed  on CPAP therapy.  Prior sleep study results are not available for my review today.  She has not been using her CPAP machine in some years.  She has had some weight gain over time.  I reviewed your office note from 02/18/2019.  Her Epworth sleepiness score is 17 out of 24, fatigue severity score is 54 out of 63.  She is married and lives with her husband and daughter.  She has 3 children.  She quit smoking in June 2020.  She drinks alcohol in the form of beer, about 2/day but is cutting back, and caffeine in the form of coffee, about 1 or 1-1/2 cups/day on average.  She does not typically drink soda on a day-to-day basis or tea.  She has no pets, she has a TV in the bedroom and falls asleep with the TV on.  She is generally in bed before 9 and rise time varies, between 7 and 10.  She is retired from engineering geologist work.  She reports that her husband has PTSD and is a very restless sleeper and she is planning to separate their bedrooms as none of them are sleeping well.  She has a family history of sleep apnea and her brother who has a CPAP machine and her son.  She has 2 sons and 1 daughter.  She estimates that she has not been on CPAP therapy since 2004, she reports that her machine was from 2000.  She had difficulty with the full facemask.  She would be willing to get retested and consider CPAP therapy again.  She denies recurrent morning headaches but has nocturia about 2 or 3 times per average night and also allergy symptoms.    REVIEW OF SYSTEMS: Out of a complete 14 system review of symptoms, none the patient complains only of the following symptoms, daytime sleepiness and all other reviewed systems are negative.  ESS: 17/24, previously 13/24  ALLERGIES: No Known Allergies  HOME MEDICATIONS: Outpatient Medications Prior to Visit  Medication Sig Dispense Refill   acetic acid -hydrocortisone  (VOSOL -HC) OTIC solution Place 3 drops into both ears 2 (two) times daily as needed. 10 mL 2   amLODipine  (NORVASC ) 2.5  MG tablet TAKE 1 TABLET EVERY DAY 90 tablet 1   aspirin  EC 81 MG tablet Take 81 mg by mouth daily.     atorvastatin  (LIPITOR) 20 MG tablet Take 1 tablet (20 mg total) by mouth daily. 90 tablet 0   bismuth subsalicylate (PEPTO BISMOL) 262 MG/15ML suspension Take 30 mLs by mouth every 6 (six) hours as needed for indigestion.     Calcium  Carb-Cholecalciferol 500-10 MG-MCG CHEW Chew 1 tablet by mouth daily.     diclofenac  Sodium (VOLTAREN ) 1 % GEL Apply 2 g topically 2 (two) times daily as needed. 350  g 2   famotidine  (PEPCID ) 20 MG tablet TAKE 1 TABLET TWICE DAILY 180 tablet 1   ketoconazole  (NIZORAL ) 2 % cream Apply 1 application. topically daily. To the affected area till better 30 g 0   loratadine (CLARITIN) 10 MG tablet Take 10 mg by mouth at bedtime.     Naproxen Sodium (ALEVE PO) Take 1 capsule by mouth as needed.     sertraline  (ZOLOFT ) 50 MG tablet TAKE 1 AND 1/2 TABLETS DAILY BY MOUTH (Patient taking differently: Take 100 mg by mouth daily.) 135 tablet 1   No facility-administered medications prior to visit.    PAST MEDICAL HISTORY: Past Medical History:  Diagnosis Date   Anxiety    Cataract    Clotting disorder    Essential hypertension, benign    GERD (gastroesophageal reflux disease)    Hx of colonic polyps 2017   Hyperlipidemia    no current medications   PAD (peripheral artery disease) 2020   Sleep apnea    doesnt wear cpap     PAST SURGICAL HISTORY: Past Surgical History:  Procedure Laterality Date   ABDOMINAL AORTOGRAM W/LOWER EXTREMITY N/A 05/16/2019   Procedure: ABDOMINAL AORTOGRAM W/LOWER EXTREMITY;  Surgeon: Court Dorn PARAS, MD;  Location: [facility];  Service: Cardiovascular;  Laterality: N/A;   APPENDECTOMY  06/16/1966   CHOLECYSTECTOMY  06/16/1966   COLONOSCOPY  06/16/2002   polyps   EYE SURGERY Bilateral    oct 2023   PERIPHERAL VASCULAR INTERVENTION  05/16/2019   Procedure: PERIPHERAL VASCULAR INTERVENTION;  Surgeon: Court Dorn PARAS, MD;   Location: [facility];  Service: Cardiovascular;;   REDUCTION MAMMAPLASTY Bilateral    TOE SURGERY     TUBAL LIGATION  06/16/1966   UPPER GASTROINTESTINAL ENDOSCOPY      FAMILY HISTORY: Family History  Problem Relation Age of Onset   Hypertension Mother    Stroke Mother    Hypertension Father    Leukemia Father 55   Early death Brother        pneumonia   Hypertension Maternal Grandmother    Early death Brother    Colon polyps Sister    Colon cancer Neg Hx    Esophageal cancer Neg Hx    Rectal cancer Neg Hx    Stomach cancer Neg Hx     SOCIAL HISTORY: Social History   Socioeconomic History   Marital status: Married    Spouse name: Not on file   Number of children: Not on file   Years of education: Not on file   Highest education level: Not on file  Occupational History   Not on file  Tobacco Use   Smoking status: Former    Current packs/day: 0.00    Average packs/day: 1 pack/day for 10.0 years (10.0 ttl pk-yrs)    Types: Cigarettes    Start date: 11/2008    Quit date: 11/2018    Years since quitting: 5.6   Smokeless tobacco: Never  Vaping Use   Vaping status: Never Used  Substance and Sexual Activity   Alcohol use: Yes    Alcohol/week: 6.0 standard drinks of alcohol    Types: 6 Cans of beer per week    Comment: daily-    Drug use: No   Sexual activity: Yes    Partners: Male    Birth control/protection: Surgical    Comment: btl  Other Topics Concern   Not on file  Social History Narrative   Not on file   Social Drivers  of Health   Tobacco Use: Medium Risk (03/30/2024)   Patient History    Smoking Tobacco Use: Former    Smokeless Tobacco Use: Never    Passive Exposure: Not on Actuary Strain: Not on file  Food Insecurity: Not on file  Transportation Needs: Not on file  Physical Activity: Not on file  Stress: Not on file  Social Connections: Not on file  Intimate Partner Violence: Not on file  Depression (EYV7-0): Not on  file  Alcohol Screen: Not on file  Housing: Not on file  Utilities: Not on file  Health Literacy: Not on file      PHYSICAL EXAM  There were no vitals filed for this visit.     There is no height or weight on file to calculate BMI.  Generalized: Well developed, in no acute distress  Cardiology: normal rate and rhythm, no murmur noted Respiratory: clear to auscultation bilaterally  Neurological examination  Mentation: Alert oriented to time, place, history taking. Follows all commands speech and language fluent Cranial nerve II-XII: Pupils were equal round reactive to light. Extraocular movements were full, visual field were full  Motor: The motor testing reveals 5 over 5 strength of all 4 extremities. Good symmetric motor tone is noted throughout.  Gait and station: Gait is normal.   DIAGNOSTIC DATA (LABS, IMAGING, TESTING) - I reviewed patient records, labs, notes, testing and imaging myself where available.      No data to display           Lab Results  Component Value Date   WBC 6.3 07/18/2021   HGB 11.1 (L) 07/18/2021   HCT 34.6 (L) 07/18/2021   MCV 83.8 07/18/2021   PLT 308 07/18/2021      Component Value Date/Time   NA 137 07/18/2021 0000   NA 138 08/06/2020 1011   K 4.6 07/18/2021 0000   CL 102 07/18/2021 0000   CO2 26 07/18/2021 0000   GLUCOSE 80 07/18/2021 0000   BUN 15 07/18/2021 0000   BUN 15 08/06/2020 1011   CREATININE 0.87 07/18/2021 0000   CALCIUM  9.4 07/18/2021 0000   PROT 6.3 03/30/2024 1544   ALBUMIN 3.9 03/30/2024 1544   AST 16 03/30/2024 1544   ALT 12 03/30/2024 1544   ALKPHOS 136 (H) 03/30/2024 1544   BILITOT 0.7 03/30/2024 1544   GFRNONAA 62 08/06/2020 1011   GFRNONAA 52 (L) 04/25/2015 1706   GFRAA 71 08/06/2020 1011   GFRAA 60 04/25/2015 1706   Lab Results  Component Value Date   CHOL 133 03/30/2024   HDL 63 03/30/2024   LDLCALC 58 03/30/2024   TRIG 56 03/30/2024   CHOLHDL 2.1 03/30/2024   Lab Results  Component Value  Date   HGBA1C 6.1 (H) 08/06/2020   Lab Results  Component Value Date   VITAMINB12 667 07/18/2021   Lab Results  Component Value Date   TSH 2.64 07/18/2021       ASSESSMENT AND PLAN 79 y.o. year old female  has a past medical history of Anxiety, Cataract, Clotting disorder, Essential hypertension, benign, GERD (gastroesophageal reflux disease), colonic polyps (2017), Hyperlipidemia, PAD (peripheral artery disease) (2020), and Sleep apnea. here with   No diagnosis found.    Bitha is doing well on CPAP therapy. Compliance report shows excellent compliance. I have encouraged her to continue nightly use and use greater than 4 hours each night. We have discussed elevated ESS scores. I have encouraged her to discuss weight management plan with PCP.  Consider reducing alcohol intake. Healthy lifestyle habits encouraged. She will follow up with me in 1 year, sooner if needed. She verbalizes understanding and agreement with this plan.    No orders of the defined types were placed in this encounter.     No orders of the defined types were placed in this encounter.      Greig Forbes, FNP-C 07/18/2024, 11:04 AM [facility] [address] [place], [place] [address] [phone]  "

## 2024-07-19 ENCOUNTER — Ambulatory Visit: Admitting: Family Medicine

## 2024-07-19 ENCOUNTER — Encounter: Payer: Self-pay | Admitting: Neurology

## 2024-07-19 ENCOUNTER — Encounter: Payer: Self-pay | Admitting: Family Medicine

## 2024-07-19 DIAGNOSIS — G4733 Obstructive sleep apnea (adult) (pediatric): Secondary | ICD-10-CM

## 2024-07-20 ENCOUNTER — Telehealth: Payer: Self-pay | Admitting: Family Medicine

## 2024-07-20 NOTE — Telephone Encounter (Signed)
 Appt. Scheduled.

## 2024-07-20 NOTE — Telephone Encounter (Signed)
 On yesterday at 2:06 pt left a vm asking her appointment be cx as a result of her having issues with her heat, she asked to be called back to r/s.  Phone rep called pt, explained she would need to speak with billing dept 1st so she could be r/s, call then connected to billing dept.

## 2024-11-15 ENCOUNTER — Ambulatory Visit: Admitting: Family Medicine
